# Patient Record
Sex: Female | Born: 1935 | Race: White | Hispanic: No | Smoking: Never smoker
Health system: Southern US, Community
[De-identification: ages and names within clinical notes are randomized; demographics above are authoritative.]

## PROBLEM LIST (undated history)

## (undated) DIAGNOSIS — R1310 Dysphagia, unspecified: Secondary | ICD-10-CM

## (undated) DIAGNOSIS — E119 Type 2 diabetes mellitus without complications: Secondary | ICD-10-CM

## (undated) DIAGNOSIS — I10 Essential (primary) hypertension: Secondary | ICD-10-CM

## (undated) DIAGNOSIS — E039 Hypothyroidism, unspecified: Secondary | ICD-10-CM

## (undated) DIAGNOSIS — J40 Bronchitis, not specified as acute or chronic: Secondary | ICD-10-CM

## (undated) DIAGNOSIS — I2510 Atherosclerotic heart disease of native coronary artery without angina pectoris: Secondary | ICD-10-CM

## (undated) DIAGNOSIS — H409 Unspecified glaucoma: Secondary | ICD-10-CM

## (undated) DIAGNOSIS — E079 Disorder of thyroid, unspecified: Secondary | ICD-10-CM

## (undated) DIAGNOSIS — R809 Proteinuria, unspecified: Secondary | ICD-10-CM

## (undated) DIAGNOSIS — D126 Benign neoplasm of colon, unspecified: Secondary | ICD-10-CM

## (undated) DIAGNOSIS — N189 Chronic kidney disease, unspecified: Secondary | ICD-10-CM

## (undated) DIAGNOSIS — M1990 Unspecified osteoarthritis, unspecified site: Secondary | ICD-10-CM

## (undated) DIAGNOSIS — F0390 Unspecified dementia without behavioral disturbance: Secondary | ICD-10-CM

## (undated) HISTORY — DX: Benign neoplasm of colon, unspecified: D12.6

## (undated) HISTORY — PX: CATARACT EXTRACTION: SUR2

## (undated) HISTORY — PX: ABDOMINAL HYSTERECTOMY: SHX81

## (undated) HISTORY — DX: Type 2 diabetes mellitus without complications: E11.9

## (undated) HISTORY — PX: CORONARY ANGIOPLASTY WITH STENT PLACEMENT: SHX49

## (undated) HISTORY — DX: Unspecified osteoarthritis, unspecified site: M19.90

## (undated) HISTORY — DX: Disorder of thyroid, unspecified: E07.9

## (undated) HISTORY — DX: Proteinuria, unspecified: R80.9

---
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
## 1988-11-09 HISTORY — PX: ROTATOR CUFF REPAIR: SHX139

## 1992-11-09 HISTORY — PX: CERVICAL LAMINECTOMY: SHX94

## 2004-09-30 ENCOUNTER — Ambulatory Visit: Payer: Self-pay | Admitting: Unknown Physician Specialty

## 2005-03-19 ENCOUNTER — Ambulatory Visit: Payer: Self-pay | Admitting: Family Medicine

## 2005-06-27 ENCOUNTER — Emergency Department: Payer: Self-pay | Admitting: Emergency Medicine

## 2006-04-27 ENCOUNTER — Ambulatory Visit: Payer: Self-pay | Admitting: Family Medicine

## 2007-05-03 ENCOUNTER — Ambulatory Visit: Payer: Self-pay | Admitting: Family Medicine

## 2007-08-29 ENCOUNTER — Ambulatory Visit: Payer: Self-pay | Admitting: Unknown Physician Specialty

## 2007-10-24 ENCOUNTER — Ambulatory Visit: Payer: Self-pay | Admitting: Anesthesiology

## 2007-10-28 ENCOUNTER — Ambulatory Visit: Payer: Self-pay | Admitting: Anesthesiology

## 2007-11-10 HISTORY — PX: COLONOSCOPY W/ POLYPECTOMY: SHX1380

## 2007-11-29 ENCOUNTER — Ambulatory Visit: Payer: Self-pay | Admitting: Anesthesiology

## 2007-12-07 ENCOUNTER — Ambulatory Visit: Payer: Self-pay | Admitting: Unknown Physician Specialty

## 2007-12-27 ENCOUNTER — Ambulatory Visit: Payer: Self-pay | Admitting: Anesthesiology

## 2008-02-09 ENCOUNTER — Ambulatory Visit: Payer: Self-pay | Admitting: Anesthesiology

## 2008-05-03 ENCOUNTER — Ambulatory Visit: Payer: Self-pay | Admitting: Family Medicine

## 2008-10-16 ENCOUNTER — Inpatient Hospital Stay (HOSPITAL_COMMUNITY): Admission: EM | Admit: 2008-10-16 | Discharge: 2008-10-19 | Payer: Self-pay | Admitting: Internal Medicine

## 2008-10-19 ENCOUNTER — Encounter (INDEPENDENT_AMBULATORY_CARE_PROVIDER_SITE_OTHER): Payer: Self-pay | Admitting: Cardiology

## 2008-10-19 ENCOUNTER — Ambulatory Visit: Payer: Self-pay | Admitting: Vascular Surgery

## 2008-11-22 ENCOUNTER — Encounter: Payer: Self-pay | Admitting: Cardiovascular Disease

## 2008-12-04 ENCOUNTER — Inpatient Hospital Stay (HOSPITAL_COMMUNITY): Admission: EM | Admit: 2008-12-04 | Discharge: 2008-12-05 | Payer: Self-pay | Admitting: Emergency Medicine

## 2008-12-10 ENCOUNTER — Encounter: Payer: Self-pay | Admitting: Cardiovascular Disease

## 2009-01-07 ENCOUNTER — Encounter: Payer: Self-pay | Admitting: Cardiovascular Disease

## 2009-05-06 ENCOUNTER — Ambulatory Visit: Payer: Self-pay | Admitting: Family Medicine

## 2009-10-01 ENCOUNTER — Ambulatory Visit: Payer: Self-pay | Admitting: Vascular Surgery

## 2010-05-07 ENCOUNTER — Ambulatory Visit: Payer: Self-pay | Admitting: Internal Medicine

## 2010-11-12 ENCOUNTER — Ambulatory Visit: Payer: Self-pay | Admitting: Internal Medicine

## 2010-11-17 ENCOUNTER — Ambulatory Visit: Payer: Self-pay | Admitting: Internal Medicine

## 2011-02-23 LAB — CARDIAC PANEL(CRET KIN+CKTOT+MB+TROPI)
CK, MB: 2.6 ng/mL (ref 0.3–4.0)
CK, MB: 3.2 ng/mL (ref 0.3–4.0)
Total CK: 49 U/L (ref 7–177)
Troponin I: <0.01 ng/mL (ref 0.00–0.06)

## 2011-02-23 LAB — LIPID PANEL
Cholesterol: 122 mg/dL (ref 0–200)
LDL Cholesterol: 67 mg/dL (ref 0–99)
Triglycerides: 100 mg/dL (ref <150)
VLDL: 20 mg/dL (ref 0–40)

## 2011-02-23 LAB — CBC
HCT: 37.2 % (ref 36.0–46.0)
HCT: 40.1 % (ref 36.0–46.0)
Hemoglobin: 13.3 g/dL (ref 12.0–15.0)
MCHC: 33.9 g/dL (ref 30.0–36.0)
MCV: 96.6 fL (ref 78.0–100.0)
Platelets: 152 10*3/uL (ref 150–400)
Platelets: 161 10*3/uL (ref 150–400)
RBC: 3.75 MIL/uL — ABNORMAL LOW (ref 3.87–5.11)
RBC: 3.89 MIL/uL (ref 3.87–5.11)
RDW: 12.8 % (ref 11.5–15.5)
WBC: 4.6 10*3/uL (ref 4.0–10.5)
WBC: 5.7 10*3/uL (ref 4.0–10.5)
WBC: 6 10*3/uL (ref 4.0–10.5)

## 2011-02-23 LAB — CK TOTAL AND CKMB (NOT AT ARMC)
Relative Index: INVALID (ref 0.0–2.5)
Total CK: 65 U/L (ref 7–177)

## 2011-02-23 LAB — HEPARIN LEVEL (UNFRACTIONATED)
Heparin Unfractionated: 0.4 IU/mL (ref 0.30–0.70)
Heparin Unfractionated: 0.64 IU/mL (ref 0.30–0.70)

## 2011-02-23 LAB — HEMOGLOBIN A1C
Hgb A1c MFr Bld: 6.6 % — ABNORMAL HIGH (ref 4.6–6.1)
Mean Plasma Glucose: 143 mg/dL

## 2011-02-23 LAB — GLUCOSE, CAPILLARY
Glucose-Capillary: 150 mg/dL — ABNORMAL HIGH (ref 70–99)
Glucose-Capillary: 153 mg/dL — ABNORMAL HIGH (ref 70–99)

## 2011-02-23 LAB — DIFFERENTIAL
Lymphocytes Relative: 22 % (ref 12–46)
Lymphs Abs: 1.3 10*3/uL (ref 0.7–4.0)
Monocytes Relative: 7 % (ref 3–12)
Neutro Abs: 4 10*3/uL (ref 1.7–7.7)
Neutrophils Relative %: 66 % (ref 43–77)

## 2011-02-23 LAB — APTT: aPTT: 22 seconds — ABNORMAL LOW (ref 24–37)

## 2011-02-23 LAB — PROTIME-INR
INR: 0.9 (ref 0.00–1.49)
Prothrombin Time: 12.7 seconds (ref 11.6–15.2)

## 2011-02-23 LAB — COMPREHENSIVE METABOLIC PANEL
ALT: 14 U/L (ref 0–35)
CO2: 29 mEq/L (ref 19–32)
Calcium: 9.9 mg/dL (ref 8.4–10.5)
Creatinine, Ser: 0.84 mg/dL (ref 0.4–1.2)
GFR calc non Af Amer: >60 mL/min (ref >60)
Glucose, Bld: 142 mg/dL — ABNORMAL HIGH (ref 70–99)
Sodium: 139 mEq/L (ref 135–145)
Total Protein: 6.4 g/dL (ref 6.0–8.3)

## 2011-02-23 LAB — TROPONIN I: Troponin I: <0.01 ng/mL (ref 0.00–0.06)

## 2011-03-24 NOTE — Discharge Summary (Signed)
[name], Dorsey NO.:  [id number]   MEDICAL RECORD NO.:  [id number]           PATIENT TYPE:   LOCATION:                                 FACILITY:   PHYSICIAN:  Dr. Tresa Endo              DATE OF BIRTH:  Mar 08, 1930   DATE OF ADMISSION:  DATE OF DISCHARGE:                               DISCHARGE SUMMARY   DISCHARGE DIAGNOSES:  1. Non-ST-elevation by troponin with a peak troponin of 1.99 with      negative CK-MB on this admission, treated with left anterior      descending drug-eluting stent placement x2 by Dr. Jacinto Halim on October 16, 2008.  2. Residual 70% small right coronary artery disease and normal      circumflex, to be treated medically.  3. Preserved left ventricular function.  4. Re-look catheterization for recurrent pain on October 18, 2008,      showing a patent left anterior descending.  5. Non-insulin-dependent diabetes.  6. Dyslipidemia with a history of Zocor intolerance secondary to      myalgia, discharged on low-dose Crestor on this admission.  7. Treated hypertension.  8. Treated hypothyroidism.  9. History of irritable bowel syndrome in the past.   HOSPITAL COURSE:  The patient is a 77 year old female from [place],  who has been seen by Dr. Tresa Endo and is also followed by Dr. Angelique Holm.  She has no prior cardiac history but does have multiple risk factors.  She had a Myoview in January 2009 that showed her EF to be 59%.  Echocardiogram in December 2008 showed mild aortic valve sclerosis.  The  patient was admitted on October 16, 2008, with chest pain consistent  with unstable angina.  She was admitted by Dr. Lynnea Ferrier.  She was started  on heparin and nitrates and Integrilin.  Her initial troponins were  negative but then turned positive, and she was taken to the cath lab  later on October 16, 2008.  This was done by Dr. Jacinto Halim.  Catheterization  revealed a 70% small RCA, normal left main, normal circumflex, normal  ramus intermedius,  99% LAD after the takeoff of the first diagonal.  She  had 2 stents placed to her LAD, a Xience and a Multi-Link Vision stent.  She tolerated the procedure well.  Troponins peaked at 1.99 and her CK-  MBs remained negative.  She did have some recurrent chest pain and was  taken back to the cath lab on October 18, 2008, by Dr. Elsie Lincoln to look  at the LAD.  He notes in his report that the entire LAD was widely  patent.  The patient was reassured.  She is transferred back to  telemetry and ambulated.  She is pain free on October 19, 2008.  She  can be discharged later today.  She does have some ecchymosis on her  right groin, this was from the October 16, 2008, procedure, the left  groin is without hematoma or ecchymosis.   LABORATORY DATA:  White count 6.2, hemoglobin 11.1,  hematocrit 32.3,  platelets 114.  Sodium 141, potassium 3.4, BUN 23, creatinine 0.9.  CK-  MB were negative.  Troponin peaked at 1.99 on October 17, 2008.   Radiology shows no acute disease.  EKG shows a sinus rhythm without  acute changes.  She does have some T-wave inversion on October 18, 2008, in the anterolateral leads.   DISCHARGE MEDICATIONS:  1. Glyburide 6 mg in the morning, 6 mg at night.  2. Synthroid 0.05 mg a day.  3. Metoprolol 100 mg twice a day.  4. Potassium 20 mEq b.i.d.  5. Norvasc 10 mg a day.  6. Benicar/hydrochlorothiazide 40/25 daily.  7. Travatan ophthalmic drops nightly.  8. Beconase nasal spray p.r.n.  9. Aspirin 81 mg 2 tablets a day.  10.Byetta 5 mcg a day.  11.Crestor 5 mg a day.  12.Nitroglycerin sublingual p.r.n.  13.She will resume her metformin 1 g b.i.d. on October 21, 2008.   DISPOSITION:  The patient is discharged in stable condition.  She will  follow up with Dr. Tresa Endo in a couple of weeks.      Abelino Derrick, P.A.    ______________________________  Dr. Berline Lopes  D:  10/19/2008  T:  10/19/2008  Job:  [id number]

## 2011-03-24 NOTE — Cardiovascular Report (Signed)
NAMEERNEST, Jody Dorsey NO.:  [id number]   MEDICAL RECORD NO.:  [id number]          PATIENT TYPE:  INP   LOCATION:  2024                         FACILITY:  [facility]   PHYSICIAN:  Madaline Savage, M.D.DATE OF BIRTH:  [birth year]   DATE OF PROCEDURE:  10/18/2008  DATE OF DISCHARGE:                            CARDIAC CATHETERIZATION   PROCEDURES PERFORMED:  1. Selective coronary angiography by Judkins technique.  2. No left ventricular angiogram.  No right coronary arteriography      performed.  3. No additional percutaneous intervention performed.   PATIENT PROFILE:  [name] is a 77 year old woman who underwent  stenting of her mid left anterior descending coronary artery on October 16, 2008, by Dr. Yates Decamp.  XIENCE 4.0 x 18 mm stent was used as well as  a Multi-Link Vision 3.0 x 12 mm stent, both were deployed maximally.  The patient had both spasm and a no-flow phenomenon both in-stent and at  the apex of the LAD.  After medical therapy over the last 2 days, the  patient was now brought back today in an asymptomatic state of health to  reassess the anatomy of the LAD in specific.  No percutaneous coronary  intervention was done.  The patient did not have intravascular  ultrasound.  The patient had only contrast dye injections of her LAD.   RESULTS:  Left main coronary artery was normal.   LAD coursed the cardiac apex, is tortuous, and has 2 radiopaque stents  in the midportion of the vessel spanning a length of approximately 30  mm.  The stent is widely deployed.  There is TIMI 3 flow both inside the  stent and proximal and distal to the stent.  This has a pristine  appearance.   The circumflex is also visualized and this vessel is large and the  dominant vessel of the circulation with no significant lesions.   The right coronary artery was not visualized today.   FINAL IMPRESSION:  Wide patency of the mid left anterior descending over  a 30-mm length  of stent that is 4.0 at the proximal end of the stent and  3 mm or so at the distal end of the stent.   PLAN:  The patient will be reassured.  Her right groin was avoided  during today's case because it was ecchymotic and swollen.  The left  groin was the entry site.  The patient will be a candidate for  ambulation later tonight or tomorrow and hopefully then will be a  candidate for discharge.           ______________________________  Madaline Savage, M.D.     WHG/MEDQ  D:  10/18/2008  T:  10/18/2008  Job:  [id number]   cc:   Nicki Guadalajara, M.D.  [facility] Cath Lab

## 2011-03-24 NOTE — Discharge Summary (Signed)
Jody Dorsey, Jody Dorsey NO.:  [id number]   MEDICAL RECORD NO.:  [id number]          PATIENT TYPE:  INP   LOCATION:  3735                         FACILITY:  [facility]   PHYSICIAN:  Antonieta Iba, MD   DATE OF BIRTH:  Mar 11, 1930   DATE OF ADMISSION:  12/04/2008  DATE OF DISCHARGE:  12/05/2008                               DISCHARGE SUMMARY   DISCHARGE DIAGNOSES:  1. Unstable angina.  2. Known coronary disease with left anterior descending stenting on      October 16, 2008, with re-look, October 18, 2008, showing patency      of the site and patency of the distal left anterior descending      which did demonstrate some spasm on October 16, 2008.  3. Residual 70% small right coronary artery to be treated medically.  4. Normal left ventricular function.  5. Non-insulin-dependent diabetes.  6. Treated hypertension.  7. Treated dyslipidemia.   HOSPITAL COURSE:  The patient is a 77 year old female followed by Dr. Lorin Picket  and seen by Dr. Mariah Milling in [place] on December 04, 2008.  She  apparently developed some substernal chest pain and presented to Dr.  Windell Hummingbird office.  Her EKG shows sinus rhythm with nonspecific ST  changes.  She was admitted to [facility] and started on heparin.  CK-MB  troponins were obtained.  These were negative for an MI.  We did add a  PPI.  We will also added nitrate.  She has had no recurrent pain,  December 05, 2008 and Dr. Lynnea Ferrier feels she can be discharged.  She will  be set up for an outpatient Myoview and a followup with Dr. Mariah Milling.   DISCHARGE MEDICATIONS:  1. Metformin 1 g b.i.d.  2. Glyburide 6 mg twice a day.  3. Synthroid 0.05 mg a day.  4. Metoprolol 100 mg twice a day.  5. Potassium 10 mEq a day.  6. Norvasc 10 mg a day.  7. Benicar/HCTZ 40/25 daily.  8. Beconase nasal spray.  9. Aspirin 81 mg a day.  10.Byetta 5 mg twice a day.  11.Plavix 75 mg a day.  12.Crestor 5 mg a day.  13.Nitroglycerin sublingual p.r.n.  14.Clonidine  0.1 mg b.i.d.  15.Travatan ophthalmic drops.  16.Prilosec 20 mg a day.  17.Imdur 30 mg a day.   LABORATORY DATA:  CK-MB and troponins were negative x3.  White count  4.6, hemoglobin 12.7, hematocrit 37.2, and platelets 152.  EKG shows  normal sinus rhythm with nonspecific ST changes.  Sodium 139, potassium  4.1, BUN 18, and creatinine 0.8.  The LFTs were normal.  INR is 0.9.  BNP is 70.  Hemoglobin A1c is 6.6.  Lipid panel shows a cholesterol of  122, HDL 35, and LDL 67.   DISPOSITION:  The patient is discharged in stable condition and will  follow up with Dr. Mariah Milling after she gets an outpatient Myoview.      Abelino Derrick, P.A.      Antonieta Iba, MD  Electronically Signed    LKK/MEDQ  D:  12/05/2008  T:  12/06/2008  Job:  [id number]

## 2011-03-24 NOTE — Cardiovascular Report (Signed)
[name], [name] NO.:  [id number]   MEDICAL RECORD NO.:  [id number]          PATIENT TYPE:  INP   LOCATION:  2902                         FACILITY:  [facility]   PHYSICIAN:  Jody Hilts. Jacinto Halim, MD       DATE OF BIRTH:  1930/04/23   DATE OF PROCEDURE:  10/16/2008  DATE OF DISCHARGE:                            CARDIAC CATHETERIZATION   PROCEDURES PERFORMED:  1. Left ventriculography.  2. Selective left coronary arteriography.  3. Percutaneous transluminal coronary angioplasty and stenting of the      mid-left anterior descending.   INDICATION:  Ms. Jody Dorsey is a 77 year old female, who was admitted  to the hospital with non-Q-wave myocardial infarction with ongoing chest  discomfort.  She was found to have positive cardiac markers for non-Q-  wave myocardial infarction.  She had nonspecific EKG changes.  Given her  significant chest pain and EKG changes and positive cardiac markers, she  was brought to the Cardiac Catheterization Lab to evaluate her coronary  anatomy.   HEMODYNAMIC DATA:  The ventricular pressure was 113/-8 with end-  diastolic pressure of 2 mmHg.  Aortic pressure was 107/54 with a mean of  77 mmHg.  There is no pressure gradient across aortic valve.   ANGIOGRAPHIC DATA:  Left ventricle:  Left ventricular systolic function  was normal with ejection fraction of 65%-70%.  There was mid cavitary  obliteration at the apex.   Right coronary artery:  Right coronary artery is nondominant vessel with  a 70% mid stenosis.   Left main coronary artery:  Left main coronary artery is a large vessel,  smooth, and normal.   Circumflex coronary artery:  Circumflex is a large vessel.  It is a  dominant vessel.  Smooth and normal.   Ramus intermediate:  Ramus intermediate is a large vessel.  It is  tortuous, but is otherwise smooth and normal.   LAD:  LAD is a large vessel.  Gives origin to several small diagonals.  Mid segment of the LAD has a high-grade  99% stenoses.  The LAD wraps  around the apex, and at the apex, there is a focal 80% stenosis.   INTERVENTIONAL DATA:  Successful PTCA and stenting of the mid-LAD.  A  4.0 x 18-mm XIENCE V stent was deployed at 10 atmospheric pressure for  10 seconds.  Following this, there was the abrupt closure that was  evident after predilatation with the balloon did not open up.  Hence,  the distal dissection was suspected.  A 3.0 x 12-mm Vision stent was  then deployed distally with an intent that she may end up needing to go  for bypass surgery emergently.  Having performed this, there was still  TIMI-0 flow.   With intracoronary nitroglycerin administration, adenosine  administration, and verapamil administration, the vessel opened up with  establishment of TIMI-3 flow.  Multiple balloon inflations were  performed within the stent including utilization of a 2.5 x 12-mm apex  balloon at 6 atmospheric pressure to potentially relieve the spasm  distally and eventually the second stent was postdilated with a 3.25 x  15-mm Voyager [place] balloon at 14 atmospheric pressure for 30 seconds.  Having performed this, angiography revealed excellent results.  Overall,  the patient was completely asymptomatic with ST segment back to  baseline.   A total of 220 mL of contrast was utilized for diagnostic and  interventional procedure.   RECOMMENDATIONS:  The patient will be admitted to the intensive care  unit for observation.  Cardiac markers will be followed through along  with the EKG.  Hopefully, the rest of the course will be smooth.  She  will need establish a statin therapy and risk modification.   TECHNIQUE OF THE PROCEDURE:  Under usual sterile precautions, using the  6-French right femoral artery access, 6-French multipurpose B2 catheter  was advanced into the ascending aorta and then to the left ventricle.  Left ventriculography was performed both in LAO and RAO projection.  Catheter pulled into the  ascending aorta.  The right coronary artery was  selectively engaged and angiography was performed.  Then, left main  coronary artery selectively engaged and angiography was performed.  Then, catheter pulled out of body.   A 7-French FL-4 guide was utilized to engage left main coronary artery.  Then using ATW guidewire, the K-wire was carefully advanced into the LAD  and the lesion length was measured.  A 3.0 x 12-mm Voyager balloon was  utilized and balloon inflations at 9 atmospheric pressure for 30 seconds  was performed.  Because of residual stenosis at high pressure, 18-20  atmospheric pressure for 12 seconds was performed and angiography was  repeated.  There was complete abrupt closure of the vessel.  I suspected  that there was probably a dissection.  A 4.0 x 18-mm XIENCE stent was  deployed at 10 atmospheric pressure and intracoronary nitroglycerin was  administered and angiography was performed.  There was no establishment  of flow.  This stent was overlapped with a 3.0 x 12-mm Multi-Link Vision  stent and the stent was deployed at 12 atmospheric pressure for 14  seconds and angiography repeated.  Again, no flow was evident.  A 2.5 x  12-mm apex balloon was utilized and balloon inflations were performed  throughout the stented segment and also distally at low pressures of 6  atmospheric pressures.  In spite of this, there was no evidence of flow.  Multiple episodes of intracoronary nitroglycerin, verapamil, and  adenosine was administered and angiography revealed excellent results  with TIMI-3 flow.  There was no evidence of any edge dissection.  The  distal stent was then postdilated with a 3.25 x 15-mm Voyager [place] balloon  at 14 atmospheric pressure for 30 seconds and angiography was repeated.  Overall, excellent results were performed.  Excellent results were  noted.  Again, a non-DES stent was utilized distally because of the fear  that the patient may end up needing to go  for bypass surgery.  Hence,  DES stent was not utilized.  However, because of Dr. Ofilia Dorsey did  step into the room, and by that time, the patient was completely  asymptomatic with excellent results.  His presence was very well  appreciated.  The guidewire was withdrawn.  Angiography completed.  Guide catheter was disengaged and pulled out of body.  The patient  tolerated the procedure.  Otherwise, no other significant complications  were noted at the end of the procedure.      Jody Hilts. Jacinto Halim, MD  Electronically Signed     JRG/MEDQ  D:  10/16/2008  T:  10/17/2008  Job:  [id number]   cc:   Angelique Holm, MD  Nicki Guadalajara, M.D.

## 2011-03-24 NOTE — Discharge Summary (Signed)
NAMEGALAXY, BORDEN NO.:  [id number]   MEDICAL RECORD NO.:  [id number]          PATIENT TYPE:  INP   LOCATION:  2024                         FACILITY:  [facility]   PHYSICIAN:  Sheliah Mends, MD      DATE OF BIRTH:  [birth year]   DATE OF ADMISSION:  10/15/2008  DATE OF DISCHARGE:  10/19/2008                               DISCHARGE SUMMARY   ADDENDUM:  Dr. Lynnea Ferrier wanted a duplex of her right femoral artery prior  to discharge, preliminary report shows no pseudoaneurysm.  She will be  discharged today afternoon with followup with Dr. Lynnea Ferrier per the  patient's request.      Abelino Derrick, P.A.      Sheliah Mends, MD  Electronically Signed    LKK/MEDQ  D:  10/19/2008  T:  10/20/2008  Job:  [id number]

## 2011-03-30 ENCOUNTER — Ambulatory Visit: Payer: Self-pay | Admitting: Unknown Physician Specialty

## 2011-04-01 LAB — PATHOLOGY REPORT

## 2011-05-11 ENCOUNTER — Ambulatory Visit: Payer: Self-pay | Admitting: Internal Medicine

## 2011-08-14 LAB — POCT CARDIAC MARKERS
CKMB, poc: 3.3 ng/mL (ref 1.0–8.0)
Myoglobin, poc: 60.4 ng/mL (ref 12–200)
Myoglobin, poc: 94.3 ng/mL (ref 12–200)
Troponin i, poc: <0.05 ng/mL (ref 0.00–0.09)

## 2011-08-14 LAB — CARDIAC PANEL(CRET KIN+CKTOT+MB+TROPI)
CK, MB: 6.6 ng/mL — ABNORMAL HIGH (ref 0.3–4.0)
CK, MB: 7.1 ng/mL — ABNORMAL HIGH (ref 0.3–4.0)
Relative Index: INVALID (ref 0.0–2.5)
Relative Index: INVALID (ref 0.0–2.5)
Relative Index: INVALID (ref 0.0–2.5)
Relative Index: INVALID (ref 0.0–2.5)
Relative Index: INVALID (ref 0.0–2.5)
Total CK: 65 U/L (ref 7–177)
Total CK: 73 U/L (ref 7–177)
Total CK: 76 U/L (ref 7–177)
Total CK: 85 U/L (ref 7–177)
Total CK: 87 U/L (ref 7–177)
Total CK: 98 U/L (ref 7–177)
Troponin I: 0.86 ng/mL (ref 0.00–0.06)
Troponin I: 1.13 ng/mL (ref 0.00–0.06)
Troponin I: 1.41 ng/mL (ref 0.00–0.06)
Troponin I: 1.99 ng/mL (ref 0.00–0.06)

## 2011-08-14 LAB — BASIC METABOLIC PANEL
BUN: 18 mg/dL (ref 6–23)
Calcium: 9.5 mg/dL (ref 8.4–10.5)
Calcium: 9.8 mg/dL (ref 8.4–10.5)
Creatinine, Ser: 0.58 mg/dL (ref 0.4–1.2)
Creatinine, Ser: 0.79 mg/dL (ref 0.4–1.2)
GFR calc Af Amer: >60 mL/min (ref >60)
GFR calc Af Amer: >60 mL/min (ref >60)
GFR calc Af Amer: >60 mL/min (ref >60)
GFR calc non Af Amer: >60 mL/min (ref >60)
GFR calc non Af Amer: >60 mL/min (ref >60)
GFR calc non Af Amer: >60 mL/min (ref >60)
Glucose, Bld: 158 mg/dL — ABNORMAL HIGH (ref 70–99)
Glucose, Bld: 205 mg/dL — ABNORMAL HIGH (ref 70–99)
Sodium: 137 mEq/L (ref 135–145)
Sodium: 141 mEq/L (ref 135–145)

## 2011-08-14 LAB — HEPATIC FUNCTION PANEL
ALT: 15 U/L (ref 0–35)
AST: 24 U/L (ref 0–37)
Alkaline Phosphatase: 44 U/L (ref 39–117)
Bilirubin, Direct: 0.2 mg/dL (ref 0.0–0.3)
Indirect Bilirubin: 0.5 mg/dL (ref 0.3–0.9)
Total Bilirubin: 0.7 mg/dL (ref 0.3–1.2)

## 2011-08-14 LAB — TSH: TSH: 1.971 u[IU]/mL (ref 0.350–4.500)

## 2011-08-14 LAB — LIPID PANEL
HDL: 35 mg/dL — ABNORMAL LOW (ref >39)
LDL Cholesterol: 110 mg/dL — ABNORMAL HIGH (ref 0–99)
Triglycerides: 159 mg/dL — ABNORMAL HIGH (ref <150)

## 2011-08-14 LAB — DIFFERENTIAL
Basophils Absolute: 0.1 10*3/uL (ref 0.0–0.1)
Eosinophils Relative: 5 % (ref 0–5)
Lymphocytes Relative: 25 % (ref 12–46)
Lymphs Abs: 1.5 10*3/uL (ref 0.7–4.0)
Monocytes Absolute: 0.4 10*3/uL (ref 0.1–1.0)
Monocytes Relative: 7 % (ref 3–12)
Neutro Abs: 3.9 10*3/uL (ref 1.7–7.7)

## 2011-08-14 LAB — GLUCOSE, CAPILLARY
Glucose-Capillary: 122 mg/dL — ABNORMAL HIGH (ref 70–99)
Glucose-Capillary: 146 mg/dL — ABNORMAL HIGH (ref 70–99)
Glucose-Capillary: 152 mg/dL — ABNORMAL HIGH (ref 70–99)
Glucose-Capillary: 167 mg/dL — ABNORMAL HIGH (ref 70–99)
Glucose-Capillary: 176 mg/dL — ABNORMAL HIGH (ref 70–99)
Glucose-Capillary: 248 mg/dL — ABNORMAL HIGH (ref 70–99)

## 2011-08-14 LAB — CBC
HCT: 32.3 % — ABNORMAL LOW (ref 36.0–46.0)
HCT: 34 % — ABNORMAL LOW (ref 36.0–46.0)
HCT: 37.5 % (ref 36.0–46.0)
Hemoglobin: 11.1 g/dL — ABNORMAL LOW (ref 12.0–15.0)
Hemoglobin: 11.1 g/dL — ABNORMAL LOW (ref 12.0–15.0)
Hemoglobin: 12.8 g/dL (ref 12.0–15.0)
MCHC: 33.3 g/dL (ref 30.0–36.0)
MCHC: 34.2 g/dL (ref 30.0–36.0)
MCHC: 34.3 g/dL (ref 30.0–36.0)
MCV: 95.8 fL (ref 78.0–100.0)
MCV: 97.6 fL (ref 78.0–100.0)
Platelets: 148 10*3/uL — ABNORMAL LOW (ref 150–400)
Platelets: 156 10*3/uL (ref 150–400)
RBC: 3.93 MIL/uL (ref 3.87–5.11)
RDW: 12.8 % (ref 11.5–15.5)
RDW: 12.9 % (ref 11.5–15.5)
RDW: 13.1 % (ref 11.5–15.5)
WBC: 10.9 10*3/uL — ABNORMAL HIGH (ref 4.0–10.5)
WBC: 6.2 10*3/uL (ref 4.0–10.5)

## 2011-08-14 LAB — URINE CULTURE

## 2011-08-14 LAB — URINALYSIS, MICROSCOPIC ONLY
Bilirubin Urine: NEGATIVE
Glucose, UA: 100 mg/dL — AB
Hgb urine dipstick: NEGATIVE
Ketones, ur: NEGATIVE mg/dL
Nitrite: NEGATIVE
Specific Gravity, Urine: 1.011 (ref 1.005–1.030)
pH: 5.5 (ref 5.0–8.0)

## 2011-08-14 LAB — POCT I-STAT, CHEM 8
Hemoglobin: 15 g/dL (ref 12.0–15.0)
Potassium: 3.2 mEq/L — ABNORMAL LOW (ref 3.5–5.1)
Sodium: 136 mEq/L (ref 135–145)
TCO2: 30 mmol/L (ref 0–100)

## 2011-08-14 LAB — CK TOTAL AND CKMB (NOT AT ARMC): Relative Index: INVALID (ref 0.0–2.5)

## 2011-08-14 LAB — APTT: aPTT: 21 seconds — ABNORMAL LOW (ref 24–37)

## 2011-08-14 LAB — AMYLASE: Amylase: 40 U/L (ref 27–131)

## 2011-08-27 ENCOUNTER — Emergency Department (HOSPITAL_COMMUNITY): Payer: Medicare Other

## 2011-08-27 ENCOUNTER — Emergency Department (HOSPITAL_COMMUNITY)
Admission: EM | Admit: 2011-08-27 | Discharge: 2011-08-27 | Disposition: A | Discharge status: Home / Self Care | Payer: Medicare Other | Attending: Emergency Medicine | Admitting: Emergency Medicine

## 2011-08-27 DIAGNOSIS — R109 Unspecified abdominal pain: Secondary | ICD-10-CM | POA: Insufficient documentation

## 2011-08-27 DIAGNOSIS — Z79899 Other long term (current) drug therapy: Secondary | ICD-10-CM | POA: Insufficient documentation

## 2011-08-27 DIAGNOSIS — E119 Type 2 diabetes mellitus without complications: Secondary | ICD-10-CM | POA: Insufficient documentation

## 2011-08-27 DIAGNOSIS — X58XXXA Exposure to other specified factors, initial encounter: Secondary | ICD-10-CM | POA: Insufficient documentation

## 2011-08-27 DIAGNOSIS — S239XXA Sprain of unspecified parts of thorax, initial encounter: Secondary | ICD-10-CM | POA: Insufficient documentation

## 2011-08-27 DIAGNOSIS — I10 Essential (primary) hypertension: Secondary | ICD-10-CM | POA: Insufficient documentation

## 2011-08-27 DIAGNOSIS — M546 Pain in thoracic spine: Secondary | ICD-10-CM | POA: Insufficient documentation

## 2011-08-27 DIAGNOSIS — Z7982 Long term (current) use of aspirin: Secondary | ICD-10-CM | POA: Insufficient documentation

## 2011-08-27 DIAGNOSIS — E039 Hypothyroidism, unspecified: Secondary | ICD-10-CM | POA: Insufficient documentation

## 2011-08-27 LAB — URINALYSIS, ROUTINE W REFLEX MICROSCOPIC
Leukocytes, UA: NEGATIVE
Protein, ur: NEGATIVE mg/dL
Urobilinogen, UA: 1 mg/dL (ref 0.0–1.0)

## 2011-08-27 LAB — URINE MICROSCOPIC-ADD ON

## 2012-01-14 ENCOUNTER — Emergency Department: Payer: Self-pay | Admitting: Internal Medicine

## 2012-01-14 LAB — CBC
HCT: 37.5 % (ref 35.0–47.0)
MCH: 32.7 pg (ref 26.0–34.0)
MCHC: 33.5 g/dL (ref 32.0–36.0)
RDW: 11.9 % (ref 11.5–14.5)
WBC: 7.4 10*3/uL (ref 3.6–11.0)

## 2012-01-14 LAB — COMPREHENSIVE METABOLIC PANEL
Albumin: 3.8 g/dL (ref 3.4–5.0)
Alkaline Phosphatase: 52 U/L (ref 50–136)
BUN: 23 mg/dL — ABNORMAL HIGH (ref 7–18)
Calcium, Total: 9.5 mg/dL (ref 8.5–10.1)
Glucose: 181 mg/dL — ABNORMAL HIGH (ref 65–99)
Osmolality: 282 (ref 275–301)
SGOT(AST): 17 U/L (ref 15–37)
Sodium: 137 mmol/L (ref 136–145)

## 2012-01-14 LAB — PROTIME-INR: Prothrombin Time: 12.7 secs (ref 11.5–14.7)

## 2012-02-18 ENCOUNTER — Other Ambulatory Visit: Payer: Self-pay | Admitting: Neurosurgery

## 2012-02-18 ENCOUNTER — Ambulatory Visit
Admission: RE | Admit: 2012-02-18 | Discharge: 2012-02-18 | Disposition: A | Discharge status: Home / Self Care | Payer: Medicare Other | Source: Ambulatory Visit | Attending: Neurosurgery | Admitting: Neurosurgery

## 2012-02-18 DIAGNOSIS — M542 Cervicalgia: Secondary | ICD-10-CM

## 2012-02-18 DIAGNOSIS — M47812 Spondylosis without myelopathy or radiculopathy, cervical region: Secondary | ICD-10-CM

## 2012-02-18 DIAGNOSIS — M5030 Other cervical disc degeneration, unspecified cervical region: Secondary | ICD-10-CM

## 2012-05-19 ENCOUNTER — Ambulatory Visit: Payer: Self-pay | Admitting: Internal Medicine

## 2012-10-13 ENCOUNTER — Ambulatory Visit: Payer: Self-pay | Admitting: Ophthalmology

## 2012-10-13 LAB — POTASSIUM: Potassium: 3.9 mmol/L (ref 3.5–5.1)

## 2012-10-25 ENCOUNTER — Ambulatory Visit: Payer: Self-pay | Admitting: Ophthalmology

## 2012-11-30 ENCOUNTER — Encounter (HOSPITAL_COMMUNITY): Payer: Self-pay | Admitting: Internal Medicine

## 2012-11-30 ENCOUNTER — Emergency Department: Payer: Self-pay | Admitting: Emergency Medicine

## 2012-11-30 ENCOUNTER — Inpatient Hospital Stay (HOSPITAL_COMMUNITY): Payer: Medicare Other

## 2012-11-30 ENCOUNTER — Inpatient Hospital Stay (HOSPITAL_COMMUNITY)
Admission: EM | Admit: 2012-11-30 | Discharge: 2012-12-04 | DRG: 086 | Disposition: A | Discharge status: Home / Self Care | Payer: Medicare Other | Source: Other Acute Inpatient Hospital | Attending: Internal Medicine | Admitting: Internal Medicine

## 2012-11-30 DIAGNOSIS — H409 Unspecified glaucoma: Secondary | ICD-10-CM | POA: Diagnosis present

## 2012-11-30 DIAGNOSIS — E119 Type 2 diabetes mellitus without complications: Secondary | ICD-10-CM | POA: Diagnosis present

## 2012-11-30 DIAGNOSIS — D62 Acute posthemorrhagic anemia: Secondary | ICD-10-CM | POA: Diagnosis present

## 2012-11-30 DIAGNOSIS — S065X9A Traumatic subdural hemorrhage with loss of consciousness of unspecified duration, initial encounter: Secondary | ICD-10-CM

## 2012-11-30 DIAGNOSIS — S065XAA Traumatic subdural hemorrhage with loss of consciousness status unknown, initial encounter: Principal | ICD-10-CM | POA: Diagnosis present

## 2012-11-30 DIAGNOSIS — Z66 Do not resuscitate: Secondary | ICD-10-CM | POA: Diagnosis present

## 2012-11-30 DIAGNOSIS — I10 Essential (primary) hypertension: Secondary | ICD-10-CM | POA: Diagnosis present

## 2012-11-30 DIAGNOSIS — N183 Chronic kidney disease, stage 3 unspecified: Secondary | ICD-10-CM | POA: Diagnosis present

## 2012-11-30 DIAGNOSIS — I6200 Nontraumatic subdural hemorrhage, unspecified: Secondary | ICD-10-CM

## 2012-11-30 DIAGNOSIS — IMO0002 Reserved for concepts with insufficient information to code with codable children: Secondary | ICD-10-CM

## 2012-11-30 DIAGNOSIS — Z9861 Coronary angioplasty status: Secondary | ICD-10-CM

## 2012-11-30 DIAGNOSIS — E876 Hypokalemia: Secondary | ICD-10-CM | POA: Diagnosis present

## 2012-11-30 DIAGNOSIS — E1122 Type 2 diabetes mellitus with diabetic chronic kidney disease: Secondary | ICD-10-CM | POA: Diagnosis present

## 2012-11-30 DIAGNOSIS — W010XXA Fall on same level from slipping, tripping and stumbling without subsequent striking against object, initial encounter: Secondary | ICD-10-CM | POA: Diagnosis present

## 2012-11-30 DIAGNOSIS — R55 Syncope and collapse: Secondary | ICD-10-CM

## 2012-11-30 DIAGNOSIS — S06309A Unspecified focal traumatic brain injury with loss of consciousness of unspecified duration, initial encounter: Secondary | ICD-10-CM | POA: Diagnosis present

## 2012-11-30 DIAGNOSIS — S06330A Contusion and laceration of cerebrum, unspecified, without loss of consciousness, initial encounter: Secondary | ICD-10-CM

## 2012-11-30 DIAGNOSIS — S0630AA Unspecified focal traumatic brain injury with loss of consciousness status unknown, initial encounter: Secondary | ICD-10-CM | POA: Diagnosis present

## 2012-11-30 DIAGNOSIS — D696 Thrombocytopenia, unspecified: Secondary | ICD-10-CM | POA: Diagnosis present

## 2012-11-30 DIAGNOSIS — I2510 Atherosclerotic heart disease of native coronary artery without angina pectoris: Secondary | ICD-10-CM | POA: Diagnosis present

## 2012-11-30 HISTORY — DX: Essential (primary) hypertension: I10

## 2012-11-30 HISTORY — DX: Unspecified glaucoma: H40.9

## 2012-11-30 HISTORY — DX: Type 2 diabetes mellitus without complications: E11.9

## 2012-11-30 HISTORY — DX: Atherosclerotic heart disease of native coronary artery without angina pectoris: I25.10

## 2012-11-30 LAB — BASIC METABOLIC PANEL
Anion Gap: 7 (ref 7–16)
Calcium, Total: 9 mg/dL (ref 8.5–10.1)
EGFR (Non-African Amer.): 55 — ABNORMAL LOW
Osmolality: 284 (ref 275–301)
Potassium: 3 mmol/L — ABNORMAL LOW (ref 3.5–5.1)

## 2012-11-30 LAB — PROTIME-INR
INR: 1
INR: 0.96 (ref 0.00–1.49)

## 2012-11-30 LAB — CBC WITH DIFFERENTIAL/PLATELET
Basophil %: 0.7 %
Eosinophil #: 0.5 10*3/uL (ref 0.0–0.7)
Eosinophil %: 5 %
Eosinophils Absolute: 0.1 10*3/uL (ref 0.0–0.7)
Eosinophils Relative: 1 % (ref 0–5)
HCT: 34 % — ABNORMAL LOW (ref 36.0–46.0)
Hemoglobin: 11.6 g/dL — ABNORMAL LOW (ref 12.0–15.0)
Lymphocyte #: 1.4 10*3/uL (ref 1.0–3.6)
Lymphs Abs: 1.2 10*3/uL (ref 0.7–4.0)
MCH: 32.7 pg (ref 26.0–34.0)
MCHC: 32.7 g/dL (ref 32.0–36.0)
MCV: 98 fL (ref 80–100)
MCV: 95.8 fL (ref 78.0–100.0)
Monocyte #: 0.5 x10 3/mm (ref 0.2–0.9)
Monocyte %: 5 %
Monocytes Absolute: 0.4 10*3/uL (ref 0.1–1.0)
Monocytes Relative: 4 % (ref 3–12)
Neutrophil #: 7.2 10*3/uL — ABNORMAL HIGH (ref 1.4–6.5)
Neutrophil %: 74.8 %
Platelet: 125 10*3/uL — ABNORMAL LOW (ref 150–440)
RBC: 3.55 MIL/uL — ABNORMAL LOW (ref 3.87–5.11)
RDW: 13.3 % (ref 11.5–14.5)
WBC: 9.7 10*3/uL (ref 3.6–11.0)

## 2012-11-30 LAB — TROPONIN I: Troponin-I: <0.02 ng/mL

## 2012-11-30 LAB — COMPREHENSIVE METABOLIC PANEL
Alkaline Phosphatase: 45 U/L (ref 39–117)
BUN: 23 mg/dL (ref 6–23)
Chloride: 97 mEq/L (ref 96–112)
Creatinine, Ser: 0.78 mg/dL (ref 0.50–1.10)
GFR calc Af Amer: 89 mL/min — ABNORMAL LOW (ref >90)
Glucose, Bld: 213 mg/dL — ABNORMAL HIGH (ref 70–99)
Potassium: 3.3 mEq/L — ABNORMAL LOW (ref 3.5–5.1)
Total Bilirubin: 0.5 mg/dL (ref 0.3–1.2)
Total Protein: 6.6 g/dL (ref 6.0–8.3)

## 2012-11-30 LAB — CK TOTAL AND CKMB (NOT AT ARMC): CK-MB: 3 ng/mL (ref 0.5–3.6)

## 2012-11-30 LAB — PHOSPHORUS: Phosphorus: 3.8 mg/dL (ref 2.3–4.6)

## 2012-11-30 LAB — URINALYSIS, COMPLETE
Bacteria: NONE SEEN
Bilirubin,UR: NEGATIVE
Glucose,UR: >=500 mg/dL (ref 0–75)
Hyaline Cast: 1
Ketone: NEGATIVE
Leukocyte Esterase: NEGATIVE
RBC,UR: <1 /HPF (ref 0–5)
Specific Gravity: 1.009 (ref 1.003–1.030)
Squamous Epithelial: 1
WBC UR: 1 /HPF (ref 0–5)

## 2012-11-30 LAB — MAGNESIUM: Magnesium: 1.7 mg/dL (ref 1.5–2.5)

## 2012-11-30 LAB — MRSA PCR SCREENING: MRSA by PCR: NEGATIVE

## 2012-11-30 LAB — LIPID PANEL
Cholesterol: 122 mg/dL (ref 0–200)
Total CHOL/HDL Ratio: 2.1 RATIO

## 2012-11-30 LAB — APTT: Activated PTT: 23 secs — ABNORMAL LOW (ref 23.6–35.9)

## 2012-11-30 MED ORDER — CLONIDINE HCL 0.1 MG PO TABS
0.1000 mg | ORAL_TABLET | Freq: Two times a day (BID) | ORAL | Status: DC
  Administered 2012-12-01 – 2012-12-04 (×8): 0.1 mg via ORAL
  Filled 2012-11-30 (×10): qty 1

## 2012-11-30 MED ORDER — INSULIN ASPART 100 UNIT/ML ~~LOC~~ SOLN
0.0000 [IU] | Freq: Three times a day (TID) | SUBCUTANEOUS | Status: DC
  Administered 2012-12-01 (×2): 5 [IU] via SUBCUTANEOUS
  Administered 2012-12-01: 2 [IU] via SUBCUTANEOUS
  Administered 2012-12-02: 5 [IU] via SUBCUTANEOUS
  Administered 2012-12-02: 2 [IU] via SUBCUTANEOUS

## 2012-11-30 MED ORDER — LEVOTHYROXINE SODIUM 50 MCG PO TABS
50.0000 ug | ORAL_TABLET | Freq: Every day | ORAL | Status: DC
  Administered 2012-12-01 – 2012-12-04 (×4): 50 ug via ORAL
  Filled 2012-11-30 (×5): qty 1

## 2012-11-30 MED ORDER — ACETAMINOPHEN 325 MG PO TABS
650.0000 mg | ORAL_TABLET | Freq: Four times a day (QID) | ORAL | Status: DC | PRN
  Administered 2012-12-01 – 2012-12-03 (×3): 650 mg via ORAL
  Filled 2012-11-30 (×3): qty 2

## 2012-11-30 MED ORDER — NICARDIPINE HCL IN NACL 20-0.86 MG/200ML-% IV SOLN
0.0000 mg/h | INTRAVENOUS | Status: DC
  Administered 2012-11-30: 5 mg/h via INTRAVENOUS
  Filled 2012-11-30: qty 200

## 2012-11-30 MED ORDER — SODIUM CHLORIDE 0.9 % IV SOLN: 250.0000 mL | INTRAVENOUS | Status: DC | PRN

## 2012-11-30 MED ORDER — ISOSORBIDE MONONITRATE ER 30 MG PO TB24
30.0000 mg | ORAL_TABLET | Freq: Every day | ORAL | Status: DC
  Administered 2012-12-01 – 2012-12-04 (×4): 30 mg via ORAL
  Filled 2012-11-30 (×4): qty 1

## 2012-11-30 MED ORDER — TRAMADOL HCL 50 MG PO TABS
50.0000 mg | ORAL_TABLET | Freq: Four times a day (QID) | ORAL | Status: DC | PRN
  Administered 2012-11-30 – 2012-12-02 (×6): 50 mg via ORAL
  Filled 2012-11-30 (×7): qty 1

## 2012-11-30 MED ORDER — METOPROLOL SUCCINATE ER 100 MG PO TB24
100.0000 mg | ORAL_TABLET | Freq: Two times a day (BID) | ORAL | Status: DC
  Administered 2012-12-01 – 2012-12-04 (×8): 100 mg via ORAL
  Filled 2012-11-30 (×10): qty 1

## 2012-11-30 MED ORDER — DOUBLE ANTIBIOTIC 500-10000 UNIT/GM EX OINT
TOPICAL_OINTMENT | Freq: Two times a day (BID) | CUTANEOUS | Status: DC
  Administered 2012-11-30: 1 via TOPICAL
  Administered 2012-12-01: 22:00:00 via TOPICAL
  Administered 2012-12-01: 1 via TOPICAL
  Administered 2012-12-02 – 2012-12-04 (×5): via TOPICAL
  Filled 2012-11-30: qty 1
  Filled 2012-11-30: qty 14.17
  Filled 2012-11-30 (×6): qty 1

## 2012-11-30 NOTE — Consult Note (Signed)
Reason for Consult: Closed head injury Referring Physician: Dr. Delila Dorsey Jody Dorsey is an 79 y.o. female.  HPI: Patient is an 79 year old individual who sustained a fall outside today she is seen in the emergency room at [facility] hospital was noted that she had evidence of contusions about the side of her head and a CT scan of the brain was performed. This revealed the presence of some parenchymal contusions on the right side of her head and a small very thin subdural hemorrhage. The patient has been on aspirin and Plavix. This is because she's had coronary stenting in the past. She is transferred here for further care.  No past medical history on file.  No past surgical history on file.  No family history on file.  Social History:  does not have a smoking history on file. She does not have any smokeless tobacco history on file. Her alcohol and drug histories not on file.  Allergies:  Allergies  Allergen Reactions  . Norvasc (Amlodipine Besylate)   . Celebrex (Celecoxib) Rash    Medications: I have reviewed the patient's current medications.  No results found for this or any previous visit (from the past 48 hour(s)).  No results found.  Review of Systems  : mental acuity   Blood pressure 125/54, pulse 94, temperature 99 F (37.2 C), temperature source Oral, resp. rate 18, height 5\' 5"  (1.651 m), weight 62.7 kg (138 lb 3.7 oz), SpO2 95.00%. Physical Exam  Constitutional: She appears well-developed and well-nourished.  HENT:       Ecchymosis and bruising about right side of face and ear  Eyes: Conjunctivae normal and EOM are normal. Pupils are equal, round, and reactive to light.  Neck: Neck supple.  Cardiovascular: Normal rate and regular rhythm.   Musculoskeletal:       Moves upper and lower extremities well no evidence of cortical drift.  Neurological: She is alert.       Oriented to hospital place and person but easily distracted and does not answer questions directly.    Skin: Skin is warm and dry.  Psychiatric: She has a normal mood and affect. Her behavior is normal. Judgment and thought content normal.    Assessment/Plan: Patient will be observed for any changes in her neurologic status repeat CT scan can be performed in the morning and so far there is no evidence of cervical spine injury the cervical collar can be removed. We will follow with you Jody Dorsey 11/30/2012, 9:48 PM

## 2012-11-30 NOTE — Progress Notes (Addendum)
Patient accepted to ICU for intracranial bleed from [facility], they discussed with Dr Danielle Dess and he recommended hospitalist admission ,but because  she is on a Cardene drip due SBP>200 , she was accepted to the ICU , discussed with Dr Vassie Loll who will see the patient first
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
## 2012-12-01 ENCOUNTER — Encounter (HOSPITAL_COMMUNITY): Payer: Self-pay | Admitting: *Deleted

## 2012-12-01 ENCOUNTER — Inpatient Hospital Stay (HOSPITAL_COMMUNITY): Payer: Medicare Other

## 2012-12-01 DIAGNOSIS — R55 Syncope and collapse: Secondary | ICD-10-CM

## 2012-12-01 DIAGNOSIS — S069X9A Unspecified intracranial injury with loss of consciousness of unspecified duration, initial encounter: Secondary | ICD-10-CM

## 2012-12-01 LAB — HEMOGLOBIN A1C: Mean Plasma Glucose: 186 mg/dL — ABNORMAL HIGH (ref <117)

## 2012-12-01 LAB — BASIC METABOLIC PANEL
BUN: 22 mg/dL (ref 6–23)
CO2: 28 mEq/L (ref 19–32)
Chloride: 99 mEq/L (ref 96–112)
Creatinine, Ser: 0.9 mg/dL (ref 0.50–1.10)
Glucose, Bld: 155 mg/dL — ABNORMAL HIGH (ref 70–99)

## 2012-12-01 LAB — GLUCOSE, CAPILLARY
Glucose-Capillary: 209 mg/dL — ABNORMAL HIGH (ref 70–99)
Glucose-Capillary: 158 mg/dL — ABNORMAL HIGH (ref 70–99)
Glucose-Capillary: 261 mg/dL — ABNORMAL HIGH (ref 70–99)
Glucose-Capillary: 292 mg/dL — ABNORMAL HIGH (ref 70–99)

## 2012-12-01 LAB — MAGNESIUM: Magnesium: 2.6 mg/dL — ABNORMAL HIGH (ref 1.5–2.5)

## 2012-12-01 MED ORDER — PANTOPRAZOLE SODIUM 40 MG IV SOLR
40.0000 mg | INTRAVENOUS | Status: DC
  Administered 2012-12-01: 40 mg via INTRAVENOUS
  Filled 2012-12-01 (×2): qty 40

## 2012-12-01 MED ORDER — MAGNESIUM SULFATE 40 MG/ML IJ SOLN
2.0000 g | Freq: Once | INTRAMUSCULAR | Status: AC
  Administered 2012-12-01: 2 g via INTRAVENOUS
  Filled 2012-12-01: qty 50

## 2012-12-01 MED ORDER — METFORMIN HCL 500 MG PO TABS
500.0000 mg | ORAL_TABLET | Freq: Two times a day (BID) | ORAL | Status: DC
  Administered 2012-12-01 – 2012-12-04 (×6): 500 mg via ORAL
  Filled 2012-12-01 (×8): qty 1

## 2012-12-01 MED ORDER — GLYBURIDE MICRONIZED 3 MG PO TABS
6.0000 mg | ORAL_TABLET | Freq: Two times a day (BID) | ORAL | Status: DC
  Administered 2012-12-01 – 2012-12-04 (×6): 6 mg via ORAL
  Filled 2012-12-01 (×8): qty 2

## 2012-12-01 MED ORDER — POTASSIUM CHLORIDE CRYS ER 20 MEQ PO TBCR
40.0000 meq | EXTENDED_RELEASE_TABLET | Freq: Once | ORAL | Status: AC
  Administered 2012-12-01: 40 meq via ORAL
  Filled 2012-12-01: qty 2

## 2012-12-01 NOTE — Progress Notes (Signed)
VASCULAR LAB PRELIMINARY  PRELIMINARY  PRELIMINARY  PRELIMINARY  Carotid duplex  completed.    Preliminary report:  Bilateral:  No evidence of hemodynamically significant internal carotid artery stenosis.   Vertebral artery flow is antegrade.      Deloyce Walthers, RVT 12/01/2012, 12:47 PM

## 2012-12-01 NOTE — Progress Notes (Signed)
eLink Physician-Brief Progress Note Patient Name: Jody Dorsey DOB: 1929/12/04 MRN: [id number]  Date of Service  12/01/2012   HPI/Events of Note  Best Practice  eICU Interventions  Protonix 40 mg IV daily for stress ulcer propy in the setting of head injury.   Intervention Category Intermediate Interventions: Best-practice therapies (e.g. DVT, beta blocker, etc.)  Nagi Furio 12/01/2012, 2:47 AM

## 2012-12-01 NOTE — Progress Notes (Signed)
Nutrition Brief Note  Patient identified on the Malnutrition Screening Tool (MST) Report.  Per record review, patient was 68.7 kg in January 2010; has had a 9% weight loss since then ---> not significant for time frame.  Body mass index is 23.00 kg/(m^2). Patient meets criteria for Normal based on current BMI.   Current diet order is Carbohydrate Modified Medium Calorie, patient is consuming approximately 100% of meals at this time.  Labs and medications reviewed.   No nutrition interventions warranted at this time. Please consult RD as needed.  Maureen Chatters, RD, LDN Pager #: [phone] After-Hours Pager #: [phone]

## 2012-12-01 NOTE — Progress Notes (Signed)
Pt arrived from 3100, VSS, neuro intact, will continue to monitor.

## 2012-12-01 NOTE — Progress Notes (Signed)
  Echocardiogram 2D Echocardiogram has been performed.  Georgian Co 12/01/2012, 5:53 PM
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
## 2012-12-01 NOTE — Progress Notes (Signed)
UR completed 

## 2012-12-01 NOTE — Progress Notes (Signed)
PULMONARY  / CRITICAL CARE MEDICINE  Name: Teesha Ohm MRN: [id number] DOB: [birth year]    LOS: 1  REFERRING MD :  Hospitalist Dr. Susie Cassette  CHIEF COMPLAINT:  Syncope, SDH  BRIEF PATIENT DESCRIPTION:  79 y.o PMH HTN, CAD with stents x 2, DM with possible syncopal episode leading to CT proven small tentorial SDH, multiple lacerations requiring sutures transferred from [place] to [facility] for further care. Started on a Nicardipine drip at [place] to control blood pressure initial BP 180s-200s at [place].    INTERVAL HISTORY:  C/O HA. Cognition intact. No new complaints. No distress  VITAL SIGNS: Temp:  [98.1 F (36.7 C)-98.3 F (36.8 C)] 98.3 F (36.8 C) (01/23 1528) Pulse Rate:  [71-98] 75  (01/23 1400) Resp:  [12-20] 16  (01/23 1400) BP: (102-156)/(43-118) 102/43 mmHg (01/23 1400) SpO2:  [93 %-100 %] 96 % (01/23 1400) HEMODYNAMICS:   VENTILATOR SETTINGS:   INTAKE / OUTPUT: Intake/Output      01/23 0701 - 01/24 0700   P.O. 360   I.V. (mL/kg) 40 (0.6)   IV Piggyback    Total Intake(mL/kg) 400 (6.4)   Urine (mL/kg/hr) 450 (0.5)   Total Output 450   Net -50       Urine Occurrence 1 x     PHYSICAL EXAMINATION: VS HR 97, 98%, RR 20s, 133/67 General: NAD Neuro: No focal deficits, cognition intact HEENT:  Multiple abrasions, ecchymosis to face, sutures to face Cardiovascular: +systolic murmur Lungs:  ctab Abdomen:  Soft, NABS Musculoskeletal: No edema  Skin:  Multiple abrasions and lacerations to face, hands    LABS:  Lab 12/01/12 0942 12/01/12 0432 11/30/12 2302 11/30/12 2255  HGB -- -- 11.6* --  WBC -- -- 9.5 --  PLT -- -- 147* --  NA -- 137 136 --  K -- 3.5 3.3* --  CL -- 99 97 --  CO2 -- 28 25 --  GLUCOSE -- 155* 213* --  BUN -- 22 23 --  CREATININE -- 0.90 0.78 --  CALCIUM -- 9.4 9.3 --  MG -- 2.6* 1.7 --  PHOS -- 3.8 3.8 --  AST -- -- 20 --  ALT -- -- 13 --  ALKPHOS -- -- 45 --  BILITOT -- -- 0.5 --  PROT -- -- 6.6 --  ALBUMIN -- -- 3.7 --   INR -- -- 0.96 --  APTT -- -- -- --  INR -- -- 0.96 --  LATICACIDVEN -- -- -- --  TROPONINI <0.30 <0.30 -- <0.30  PHART -- -- -- --  PCO2ART -- -- -- --  PO2ART -- -- -- --  HCO3 -- -- -- --  O2SAT -- -- -- --   CBG trend  Lab 12/01/12 1527 12/01/12 1153 12/01/12 0825 12/01/12 0005  GLUCAP 292* 261* 158* 209*    IMAGING: 1/22 CXR [place] negative 1/22 CT head [place] small tentorial SDH, bilateral parenchymal hemorrhages. No fracture 1/22 Pelvis AP: bilateral degenerative changes. No acute abnormality. Calcifications in pelvis consistent with phleboliths  1/22 CT cervical spine: degenerative change. No evidence of fracture   ECG: 1/22 [place]: Normal sinus rhythm. LAD, no LVH, Rate <100, no acute ST changes, QRS interval normal, PR interval normal; possible T wave inversions V1-V2 ? Left atrial enlargement.   DIAGNOSES: Principal Problem:  *Closed head injury with petechial brain hemorrhage Active Problems:  SDH (subdural hematoma)  Diabetes mellitus  Laceration   PLAN: Transfer to SDU Change neuro checks to q 4 hrs Resume outpt DM meds  Advance activity Discussed with Dr Butler Denmark.  TRH to assume care as of AM 1/24 and PCCM will s/o Complete syncope eval  Echo and carotid duplex results pending  Billy Fischer, MD ; [facility] service Mobile [phone].  After 5:30 PM or weekends, call [phone]

## 2012-12-01 NOTE — Progress Notes (Signed)
PULMONARY  / CRITICAL CARE MEDICINE  Name: Jody Dorsey MRN: [id number] DOB: 1930-08-13    LOS: 1  REFERRING MD :  Hospitalist Dr. Susie Cassette  CHIEF COMPLAINT:  Syncope, SDH  BRIEF PATIENT DESCRIPTION:  79 y.o PMH HTN, CAD with stents x 2, DM with possible syncopal episode leading to CT proven small tentorial SDH, multiple lacerations requiring sutures transferred from [place] to [facility] for further care. Started on a Nicardipine drip at [place] to control blood pressure initial BP 180s-200s at [place].    INTERVAL HISTORY:  C/O HA. Cognition intact. No new complaints. No distress  VITAL SIGNS: Temp:  [98.1 F (36.7 C)-98.3 F (36.8 C)] 98.3 F (36.8 C) (01/23 1528) Pulse Rate:  [71-98] 75  (01/23 1400) Resp:  [12-20] 16  (01/23 1400) BP: (102-156)/(43-118) 102/43 mmHg (01/23 1400) SpO2:  [93 %-100 %] 96 % (01/23 1400) HEMODYNAMICS:   VENTILATOR SETTINGS:   INTAKE / OUTPUT: Intake/Output      01/23 0701 - 01/24 0700   P.O. 360   I.V. (mL/kg) 40 (0.6)   IV Piggyback    Total Intake(mL/kg) 400 (6.4)   Urine (mL/kg/hr) 450 (0.5)   Total Output 450   Net -50       Urine Occurrence 1 x     PHYSICAL EXAMINATION: VS HR 97, 98%, RR 20s, 133/67 General: NAD Neuro: No focal deficits, cognition intact HEENT:  Multiple abrasions, ecchymosis to face, sutures to face Cardiovascular: +systolic murmur Lungs:  ctab Abdomen:  Soft, NABS Musculoskeletal: No edema  Skin:  Multiple abrasions and lacerations to face, hands    LABS:  Lab 12/01/12 0942 12/01/12 0432 11/30/12 2302 11/30/12 2255  HGB -- -- 11.6* --  WBC -- -- 9.5 --  PLT -- -- 147* --  NA -- 137 136 --  K -- 3.5 3.3* --  CL -- 99 97 --  CO2 -- 28 25 --  GLUCOSE -- 155* 213* --  BUN -- 22 23 --  CREATININE -- 0.90 0.78 --  CALCIUM -- 9.4 9.3 --  MG -- 2.6* 1.7 --  PHOS -- 3.8 3.8 --  AST -- -- 20 --  ALT -- -- 13 --  ALKPHOS -- -- 45 --  BILITOT -- -- 0.5 --  PROT -- -- 6.6 --  ALBUMIN -- -- 3.7 --   INR -- -- 0.96 --  APTT -- -- -- --  INR -- -- 0.96 --  LATICACIDVEN -- -- -- --  TROPONINI <0.30 <0.30 -- <0.30  PHART -- -- -- --  PCO2ART -- -- -- --  PO2ART -- -- -- --  HCO3 -- -- -- --  O2SAT -- -- -- --   CBG trend  Lab 12/01/12 1527 12/01/12 1153 12/01/12 0825 12/01/12 0005  GLUCAP 292* 261* 158* 209*    IMAGING: 1/22 CXR [place] negative 1/22 CT head [place] small tentorial SDH, bilateral parenchymal hemorrhages. No fracture 1/22 Pelvis AP: bilateral degenerative changes. No acute abnormality. Calcifications in pelvis consistent with phleboliths  1/22 CT cervical spine: degenerative change. No evidence of fracture   ECG: 1/22 [place]: Normal sinus rhythm. LAD, no LVH, Rate <100, no acute ST changes, QRS interval normal, PR interval normal; possible T wave inversions V1-V2 ? Left atrial enlargement.   DIAGNOSES: Principal Problem:  *Closed head injury with petechial brain hemorrhage Active Problems:  SDH (subdural hematoma)  Diabetes mellitus  Laceration   PLAN: Transfer to SDU Change neuro checks to q 4 hrs Resume outpt DM meds  Advance activity Discussed with Dr Butler Denmark.  TRH to assume care as of AM 1/24 and PCCM will s/o   Billy Fischer, MD ; [facility] [phone].  After 5:30 PM or weekends, call [phone]

## 2012-12-02 LAB — GLUCOSE, CAPILLARY
Glucose-Capillary: 276 mg/dL — ABNORMAL HIGH (ref 70–99)
Glucose-Capillary: 223 mg/dL — ABNORMAL HIGH (ref 70–99)

## 2012-12-02 MED ORDER — INSULIN ASPART 100 UNIT/ML ~~LOC~~ SOLN
0.0000 [IU] | Freq: Three times a day (TID) | SUBCUTANEOUS | Status: DC
  Administered 2012-12-03 (×2): 3 [IU] via SUBCUTANEOUS
  Administered 2012-12-03: 11 [IU] via SUBCUTANEOUS
  Administered 2012-12-04 (×2): 5 [IU] via SUBCUTANEOUS

## 2012-12-02 MED ORDER — TROLAMINE SALICYLATE 10 % EX CREA: TOPICAL_CREAM | Freq: Two times a day (BID) | CUTANEOUS | Status: DC | PRN

## 2012-12-02 MED ORDER — TELMISARTAN-HCTZ 80-25 MG PO TABS: 1.0000 | ORAL_TABLET | Freq: Every day | ORAL | Status: DC

## 2012-12-02 MED ORDER — LACTULOSE 10 GM/15ML PO SOLN
20.0000 g | Freq: Two times a day (BID) | ORAL | Status: DC | PRN
  Filled 2012-12-02 (×2): qty 30

## 2012-12-02 MED ORDER — INSULIN ASPART 100 UNIT/ML ~~LOC~~ SOLN
0.0000 [IU] | Freq: Three times a day (TID) | SUBCUTANEOUS | Status: DC
  Administered 2012-12-02: 5 [IU] via SUBCUTANEOUS

## 2012-12-02 MED ORDER — INSULIN ASPART 100 UNIT/ML ~~LOC~~ SOLN: 0.0000 [IU] | Freq: Three times a day (TID) | SUBCUTANEOUS | Status: DC

## 2012-12-02 MED ORDER — SENNOSIDES-DOCUSATE SODIUM 8.6-50 MG PO TABS
1.0000 | ORAL_TABLET | Freq: Two times a day (BID) | ORAL | Status: DC
  Administered 2012-12-02: 1 via ORAL
  Filled 2012-12-02: qty 1

## 2012-12-02 MED ORDER — ATORVASTATIN CALCIUM 10 MG PO TABS
10.0000 mg | ORAL_TABLET | Freq: Every day | ORAL | Status: DC
  Administered 2012-12-02 – 2012-12-03 (×2): 10 mg via ORAL
  Filled 2012-12-02 (×3): qty 1

## 2012-12-02 MED ORDER — MUSCLE RUB 10-15 % EX CREA
TOPICAL_CREAM | Freq: Two times a day (BID) | CUTANEOUS | Status: DC | PRN
  Administered 2012-12-03 – 2012-12-04 (×3): via TOPICAL
  Filled 2012-12-02: qty 85

## 2012-12-02 MED ORDER — INSULIN ASPART 100 UNIT/ML ~~LOC~~ SOLN: 0.0000 [IU] | Freq: Every day | SUBCUTANEOUS | Status: DC

## 2012-12-02 MED ORDER — ONDANSETRON HCL 4 MG/2ML IJ SOLN
4.0000 mg | Freq: Four times a day (QID) | INTRAMUSCULAR | Status: DC | PRN
  Administered 2012-12-02: 4 mg via INTRAVENOUS

## 2012-12-02 MED ORDER — HYDROCHLOROTHIAZIDE 25 MG PO TABS
25.0000 mg | ORAL_TABLET | Freq: Every day | ORAL | Status: DC
  Administered 2012-12-02 – 2012-12-04 (×3): 25 mg via ORAL
  Filled 2012-12-02 (×3): qty 1

## 2012-12-02 MED ORDER — AMLODIPINE BESYLATE 10 MG PO TABS
10.0000 mg | ORAL_TABLET | Freq: Every day | ORAL | Status: DC
  Administered 2012-12-02 – 2012-12-04 (×3): 10 mg via ORAL
  Filled 2012-12-02 (×3): qty 1

## 2012-12-02 MED ORDER — IRBESARTAN 300 MG PO TABS
300.0000 mg | ORAL_TABLET | Freq: Every day | ORAL | Status: DC
  Administered 2012-12-02 – 2012-12-04 (×3): 300 mg via ORAL
  Filled 2012-12-02 (×3): qty 1

## 2012-12-02 MED ORDER — ONDANSETRON HCL 4 MG/2ML IJ SOLN
INTRAMUSCULAR | Status: AC
  Administered 2012-12-02: 4 mg via INTRAVENOUS
  Filled 2012-12-02: qty 2

## 2012-12-02 MED ORDER — POTASSIUM CHLORIDE CRYS ER 20 MEQ PO TBCR
40.0000 meq | EXTENDED_RELEASE_TABLET | ORAL | Status: AC
  Administered 2012-12-02 (×2): 40 meq via ORAL
  Filled 2012-12-02 (×4): qty 2

## 2012-12-02 NOTE — Progress Notes (Signed)
Pt admitted to 4N3. Alert and oriented.  Pt denies pain at present.  Daughter at bedside. Bed alarm on.

## 2012-12-02 NOTE — Progress Notes (Addendum)
[facility] Progress Note [place] TEAM 1 - Stepdown/ICU TEAM   Nickole Adamek ZOX:[id number] DOB: May 27, 1930 DOA: 11/30/2012 PCP: Default, Provider, MD  Brief narrative: 79 y.o PMH HTN, CAD with stents x 2, DM with possible syncopal episode leading to CT proven small tentorial SDH, multiple lacerations requiring sutures transferred from [place] to [facility] for further care. Started on a Nicardipine drip at [place] to control blood pressure initial BP 180s-200s at [place].   Assessment/Plan:  Closed head injury / SDH + SAH NS has seen and has since signed off - no intervention needed - will opt to hold all anticoag for 7 days - have discussed this with the pt and her daughter, who voice understanding   Fall w/ multiple lacerations I have discussed the circumstances of the patient's accident in great detail with her and her daughter - the daughter believes, based on her investigation, that the patient actually slipped and fell in her icey/snow covered driveway, striking her head and then becoming unconscious - the details that she provides do indeed fit the clinical picture - as a result I feel it is most likely that the patient did not in fact suffer a syncopal episode but rather slipped and fell - her carotid Dopplers and echocardiogram are unrevealing - as a result I do not feel that further syncope workup is required  Malignant HTN Required cardene gtt initially in setting of ICH - BP trending back up - titrate meds and follow   Hypokalemia Continue to replace and follow trend - magnesium is normal  Normocytic anemia Recheck hemoglobin in a.m.  DM CBG elevated - adjust tx and follow trend   CAD s/p PTCA/Stent x2 2008 The plan at this time is to resume aspirin and Plavix therapy in 7 days - we will also refer the patient back to her cardiologist to determine if the ongoing use of both of these therapies as indicated given the amount of time that has passed since her stent  placement  Code Status: DNR/NO CODE BLUE Family Communication:  Disposition Plan: transfer to tele bed - PT/OT   Consultants: PCCM Neurosurgery Trauma Surgery  Procedures: Carotid dopplers - 1/23 - unrevealing TTE - 1/23 - EF 60-65% - grade 1 DD - no signif AoS or other valvular disease  Antibiotics: none  DVT prophylaxis: SCDs   HPI/Subjective: The patient is resting comfortably.  She complains of some pain in her right thigh.  She denies acute visual change.  She denies focal weakness.  She denies headache.  She is alert and oriented.   Objective: Blood pressure 169/64, pulse 76, temperature 97.8 F (36.6 C), temperature source Oral, resp. rate 25, height 5\' 5"  (1.651 m), weight 62.7 kg (138 lb 3.7 oz), SpO2 100.00%.  Intake/Output Summary (Last 24 hours) at 12/02/12 1454 Last data filed at 12/02/12 0830  Gross per 24 hour  Intake    360 ml  Output    151 ml  Net    209 ml     Exam: General: No acute respiratory distress - ecchymosis of right lateral forehead with hematoma approximate size of 1/2 ping-pong ball in this region Lungs: Clear to auscultation bilaterally without wheezes or crackles Cardiovascular: Regular rate and rhythm without gallop or rub - 2/6 holosystolic murmur (patient reports is chronic) Abdomen: Nontender, nondistended, soft, bowel sounds positive, no rebound, no ascites, no appreciable mass Extremities: No significant cyanosis, clubbing, or edema bilateral lower extremities  Data Reviewed: Basic Metabolic Panel:  Lab 12/01/12 8119  11/30/12 2302  NA 137 136  K 3.5 3.3*  CL 99 97  CO2 28 25  GLUCOSE 155* 213*  BUN 22 23  CREATININE 0.90 0.78  CALCIUM 9.4 9.3  MG 2.6* 1.7  PHOS 3.8 3.8   Liver Function Tests:  Lab 11/30/12 2302  AST 20  ALT 13  ALKPHOS 45  BILITOT 0.5  PROT 6.6  ALBUMIN 3.7   CBC:  Lab 11/30/12 2302  WBC 9.5  NEUTROABS 7.8*  HGB 11.6*  HCT 34.0*  MCV 95.8  PLT 147*   Cardiac Enzymes:  Lab 12/01/12  0942 12/01/12 0432 11/30/12 2255  CKTOTAL -- -- --  CKMB -- -- --  CKMBINDEX -- -- --  TROPONINI <0.30 <0.30 <0.30   BNP (last 3 results)  Basename 11/30/12 2255  PROBNP 908.4*   CBG:  Lab 12/02/12 0809 12/01/12 2154 12/01/12 1527 12/01/12 1153 12/01/12 0825  GLUCAP 160* 247* 292* 261* 158*    Recent Results (from the past 240 hour(s))  MRSA PCR SCREENING     Status: Normal   Collection Time   11/30/12  8:26 PM      Component Value Range Status Comment   MRSA by PCR NEGATIVE  NEGATIVE Final      Studies:  Recent x-ray studies have been reviewed in detail by the Attending Physician  Scheduled Meds:  Reviewed in detail by the Attending Physician   Lonia Blood, MD [facility] Office  [phone] Pager [phone]  On-Call/Text Page:      [email or web address]      password [email or web address]  If 7PM-7AM, please contact night-coverage [email or web address] Password [email or web address] 12/02/2012, 2:54 PM   LOS: 2 days

## 2012-12-02 NOTE — Progress Notes (Signed)
Patient ID: Jody Dorsey, female   DOB: [birth year], 79 y.o.   MRN: [id number] Patient appears neurologically stable motor function appears intact. Lacerations and contusions externally appear to be healing nicely.  At this time I'll sign off the patient's care please reconsult should further needs be identified.

## 2012-12-02 NOTE — Progress Notes (Signed)
Inpatient Diabetes Program Recommendations  AACE/ADA: New Consensus Statement on Inpatient Glycemic Control (2013)  Target Ranges:  Prepandial:   less than 140 mg/dL      Peak postprandial:   less than 180 mg/dL (1-2 hours)      Critically ill patients:  140 - 180 mg/dL  Results for Jody Dorsey, Jody Dorsey (MRN [id number]) as of 12/02/2012 14:06  Ref. Range 12/01/2012 11:53 12/01/2012 15:27 12/01/2012 21:54 12/02/2012 08:09  Glucose-Capillary Latest Range: 70-99 mg/dL 782 (H) 956 (H) 213 (H) 160 (H)    Inpatient Diabetes Program Recommendations Insulin - Meal Coverage: add Novolog 3 units with meals  Thank you  Piedad Climes BSN, RN,CDE Inpatient Diabetes Coordinator [phone] (team pager)

## 2012-12-02 NOTE — Progress Notes (Signed)
Pt tx 4N per MD order, pt VSS, pt and family verbalized understanding of tx, report called to receiving RN, all questions answered

## 2012-12-03 LAB — BASIC METABOLIC PANEL
BUN: 21 mg/dL (ref 6–23)
CO2: 30 mEq/L (ref 19–32)
Chloride: 101 mEq/L (ref 96–112)
Creatinine, Ser: 0.85 mg/dL (ref 0.50–1.10)

## 2012-12-03 LAB — GLUCOSE, CAPILLARY
Glucose-Capillary: 318 mg/dL — ABNORMAL HIGH (ref 70–99)
Glucose-Capillary: 187 mg/dL — ABNORMAL HIGH (ref 70–99)
Glucose-Capillary: 151 mg/dL — ABNORMAL HIGH (ref 70–99)

## 2012-12-03 LAB — CBC
HCT: 31 % — ABNORMAL LOW (ref 36.0–46.0)
MCV: 96.6 fL (ref 78.0–100.0)
Platelets: 126 10*3/uL — ABNORMAL LOW (ref 150–400)
RBC: 3.21 MIL/uL — ABNORMAL LOW (ref 3.87–5.11)
WBC: 5.6 10*3/uL (ref 4.0–10.5)

## 2012-12-03 NOTE — Evaluation (Signed)
Occupational Therapy Evaluation Patient Details Name: Jody Dorsey MRN: [id number] DOB: May 22, 1930 Today's Date: 12/03/2012 Time: 8119-1478 OT Time Calculation (min): 26 min  OT Assessment / Plan / Recommendation Clinical Impression  Pt admitted with SDH due to fall at home.  Pt will benefit from acute OT services to address below problem list in prep for return home with 24/7 supervision.    OT Assessment  Patient needs continued OT Services    Follow Up Recommendations  No OT follow up;Supervision/Assistance - 24 hour    Barriers to Discharge None    Equipment Recommendations  None recommended by OT    Recommendations for Other Services    Frequency  Min 2X/week    Precautions / Restrictions Precautions Precautions: Fall   Pertinent Vitals/Pain See vitals    ADL  Grooming: Performed;Wash/dry hands;Supervision/safety Where Assessed - Grooming: Unsupported standing Lower Body Bathing: Simulated;Supervision/safety Where Assessed - Lower Body Bathing: Supported sit to stand Lower Body Dressing: Performed;Supervision/safety Where Assessed - Lower Body Dressing: Supported sit to Pharmacist, hospital: Performed;Minimal Dentist Method: Sit to Barista: Comfort height toilet Toileting - Clothing Manipulation and Hygiene: Performed;Supervision/safety Where Assessed - Engineer, mining and Hygiene: Sit to stand from 3-in-1 or toilet Equipment Used: Gait belt;Rolling walker Transfers/Ambulation Related to ADLs: Min assist with HHA x1 during ambulation to bathroom.   Pt min guard when ambulating with RW.  Slow steps but no LOB. ADL Comments: Educated pt and daughter for pt to have someone present when performing tub transfers and to use shower chair to help minimize fall risk while bathing.  Pt and daughter verbalized understanding. Pt requiring increased time to complete tasks due to feeling sore on right side of body due to  fall.    OT Diagnosis: Generalized weakness  OT Problem List: Decreased strength;Decreased activity tolerance;Impaired balance (sitting and/or standing);Decreased knowledge of use of DME or AE OT Treatment Interventions: Self-care/ADL training;DME and/or AE instruction;Therapeutic activities;Patient/family education;Balance training   OT Goals Acute Rehab OT Goals OT Goal Formulation: With patient/family Time For Goal Achievement: 12/10/12 Potential to Achieve Goals: Good ADL Goals Pt Will Transfer to Toilet: with supervision;Ambulation;with DME;Comfort height toilet ADL Goal: Toilet Transfer - Progress: Goal set today Pt Will Perform Tub/Shower Transfer: Tub transfer;with supervision;Ambulation;with DME;Shower seat with back ADL Goal: Web designer - Progress: Goal set today Miscellaneous OT Goals Miscellaneous OT Goal #1: Pt will consistently demonstrate safe use of RW at supervision level during all functional mobility. OT Goal: Miscellaneous Goal #1 - Progress: Goal set today  Visit Information  Last OT Received On: 12/03/12 Assistance Needed: +1    Subjective Data      Prior Functioning     Home Living Lives With: Alone Available Help at Discharge: Family;Friend(s);Available 24 hours/day (daughter lives next door) Type of Home: House Home Access: Stairs to enter [employer] of Steps: 3 Entrance Stairs-Rails: Right Home Layout: One level Bathroom Shower/Tub: Tub/shower unit;Curtain Bathroom Toilet: Handicapped height Home Adaptive Equipment: Grab bars in shower;Straight cane;Walker - rolling;Shower chair with back Prior Function Level of Independence: Independent Able to Take Stairs?: Yes Driving: Yes Vocation: Retired Musician: No difficulties Dominant Hand: Right         Vision/Perception     Cognition  Overall Cognitive Status: Appears within functional limits for tasks assessed/performed Arousal/Alertness:  Awake/alert Orientation Level: Appears intact for tasks assessed Behavior During Session: [facility] for tasks performed    Extremity/Trunk Assessment Right Upper Extremity Assessment RUE ROM/Strength/Tone: [facility] for tasks assessed  Left Upper Extremity Assessment LUE ROM/Strength/Tone: [facility] for tasks assessed     Mobility Bed Mobility Bed Mobility: Supine to Sit;Sitting - Scoot to Edge of Bed Supine to Sit: 6: Modified independent (Device/Increase time) Sitting - Scoot to Edge of Bed: 6: Modified independent (Device/Increase time) Transfers Transfers: Sit to Stand;Stand to Sit Sit to Stand: 4: Min assist;With upper extremity assist;From bed;From toilet Stand to Sit: 4: Min assist;With upper extremity assist;To chair/3-in-1;To toilet Details for Transfer Assistance: Min assist for stability and safety. Cues for safe hand placement to/from RW     Shoulder Instructions     Exercise     Balance     End of Session OT - End of Session Equipment Utilized During Treatment: Gait belt Activity Tolerance: Patient tolerated treatment well Patient left: in chair;with call bell/phone within reach;with family/visitor present Nurse Communication: Mobility status  GO    12/03/2012 Cipriano Mile OTR/L Pager [phone] Office [phone]  Cipriano Mile 12/03/2012, 1:17 PM

## 2012-12-03 NOTE — Progress Notes (Signed)
[facility] PROGRESS NOTE  Jody Dorsey WUJ:[id number] DOB: [birth year] DOA: 11/30/2012 PCP: Default, Provider, MD Brief narrative:  79 y.o PMH HTN, CAD with stents x 2, DM with possible syncopal episode leading to CT proven small tentorial SDH, multiple lacerations requiring sutures transferred from [place] to [facility] for further care. Started on a Nicardipine drip at [place] to control blood pressure initial BP 180s-200s at [place].   Assessment/Plan: Closed head injury / SDH + [facility], Dr. Danielle Dess has signed off. No intervention needed.  Discussed with Dr. Wynetta Emery neurosurgery about when to resume aspirin and plavix.  He recommended repeating head CT tomorrow, if bleeding is resolved then to resume aspirin in 7 days.  Uncertain if patient needs to be on Plavix this time, will have the patient follow up with her primary care physician through determine if and when Plavix needs to be resumed.    Fall w/ multiple lacerations  Dr. Sharon Seller discussed the circumstances of the patient's accident in great detail with her and her daughter. The daughter believes, based on her investigation, that the patient actually slipped and fell in her icey/snow covered driveway, striking her head and then becoming unconscious. The details that she provides do indeed fit the clinical picture, patient likely did not suffer a syncopal episode, but rather had a mechanical fall.  Carotid Dopplers and echocardiogram are unrevealing.   Malignant HTN  Required cardene gtt initially in setting of ICH.  Blood pressure improved.  Now back on home antihypertensive medications.  Continue to monitor.  Hypokalemia  Resolved with replacement.  Normocytic anemia  Hemoglobin stable continue to monitor.  DM  CBG elevated.  Sliding scale insulin.  On home glyburide and metformin.  CAD s/p PTCA/Stent x2 2008  The plan at this time is to resume aspirin in 7 days.  Patient to followup with her primary care  physician to discuss when to resume Plavix.  Patient should also follow up with her cardiologist to determine if the ongoing use of both of these therapies as indicated given the amount of time that has passed since her stent placement   Code Status: DNR/NO CODE BLUE  Family Communication: No family at bedside. Disposition Plan: Pending.  Consider possible discharge tomorrow.   Consultants:  PCCM  Neurosurgery  Trauma Surgery   Procedures:  Carotid dopplers - 1/23 - unrevealing  TTE - 1/23 - EF 60-65% - grade 1 DD - no signif AoS or other valvular disease   Antibiotics:  none   DVT prophylaxis:  SCDs   HPI/Subjective: No specific concerns.  Feeling improved overall.  Objective: Filed Vitals:   12/03/12 0231 12/03/12 0244 12/03/12 0619 12/03/12 0941  BP: 142/54 140/60 140/49 134/52  Pulse: 71 81 71 73  Temp: 97.9 F (36.6 C) 97.8 F (36.6 C) 98.3 F (36.8 C) 97.8 F (36.6 C)  TempSrc: Oral Oral Oral Oral  Resp: 18 18 18 18   Height:      Weight:      SpO2: 97% 94% 96% 100%    Intake/Output Summary (Last 24 hours) at 12/03/12 1355 Last data filed at 12/03/12 0815  Gross per 24 hour  Intake    540 ml  Output      1 ml  Net    539 ml   Filed Weights   11/30/12 2011  Weight: 62.7 kg (138 lb 3.7 oz)    Exam: Physical Exam: General: Awake, Oriented, No acute distress. HEENT: EOMI. Ecchymosis over the right lateral forehead with hematoma.  Neck: Supple CV: S1 and S2 Lungs: Clear to ascultation bilaterally Abdomen: Soft, Nontender, Nondistended, +bowel sounds. Ext: Good pulses. Trace edema. Skin: Multiple bruising on hands bilaterally.  Data Reviewed: Basic Metabolic Panel:  Lab 12/03/12 4098 12/01/12 0432 11/30/12 2302  NA 139 137 136  K 4.5 3.5 3.3*  CL 101 99 97  CO2 30 28 25   GLUCOSE 255* 155* 213*  BUN 21 22 23   CREATININE 0.85 0.90 0.78  CALCIUM 9.6 9.4 9.3  MG -- 2.6* 1.7  PHOS -- 3.8 3.8   Liver Function Tests:  Lab 11/30/12 2302  AST 20    ALT 13  ALKPHOS 45  BILITOT 0.5  PROT 6.6  ALBUMIN 3.7   No results found for this basename: LIPASE:5,AMYLASE:5 in the last 168 hours No results found for this basename: AMMONIA:5 in the last 168 hours CBC:  Lab 12/03/12 0605 11/30/12 2302  WBC 5.6 9.5  NEUTROABS -- 7.8*  HGB 10.3* 11.6*  HCT 31.0* 34.0*  MCV 96.6 95.8  PLT 126* 147*   Cardiac Enzymes:  Lab 12/01/12 0942 12/01/12 0432 11/30/12 2255  CKTOTAL -- -- --  CKMB -- -- --  CKMBINDEX -- -- --  TROPONINI <0.30 <0.30 <0.30   BNP (last 3 results)  Basename 11/30/12 2255  PROBNP 908.4*   CBG:  Lab 12/03/12 1140 12/03/12 0759 12/02/12 2148 12/02/12 1718 12/02/12 1125  GLUCAP 166* 318* 184* 223* 276*    Recent Results (from the past 240 hour(s))  MRSA PCR SCREENING     Status: Normal   Collection Time   11/30/12  8:26 PM      Component Value Range Status Comment   MRSA by PCR NEGATIVE  NEGATIVE Final      Studies: No results found.  Scheduled Meds:   . amLODipine  10 mg Oral Daily  . atorvastatin  10 mg Oral q1800  . cloNIDine  0.1 mg Oral BID  . glyBURIDE micronized  6 mg Oral BID AC  . hydrochlorothiazide  25 mg Oral Daily  . insulin aspart  0-15 Units Subcutaneous TID WC  . insulin aspart  0-5 Units Subcutaneous QHS  . irbesartan  300 mg Oral Daily  . isosorbide mononitrate  30 mg Oral Daily  . levothyroxine  50 mcg Oral QAC breakfast  . metFORMIN  500 mg Oral BID WC  . metoprolol succinate  100 mg Oral BID  . polymixin-bacitracin   Topical BID  . [COMPLETED] potassium chloride  40 mEq Oral Q4H  . senna-docusate  1 tablet Oral BID   Continuous Infusions:   Principal Problem:  *Closed head injury with petechial brain hemorrhage Active Problems:  SDH (subdural hematoma)  Diabetes mellitus  Laceration    Jody Dorsey A, MD  [facility] Pager [phone]. If 7PM-7AM, please contact night-coverage at [email or web address], password [facility] 12/03/2012, 1:55 PM  LOS: 3 days

## 2012-12-03 NOTE — Evaluation (Signed)
Physical Therapy Evaluation Patient Details Name: Jody Dorsey MRN: [id number] DOB: [birth year] Today's Date: 12/03/2012 Time: 4098-1191 PT Time Calculation (min): 31 min  PT Assessment / Plan / Recommendation Clinical Impression  Pt admitted s/p fall with SDH. Pt currently with decreased balance and functional mobility. Pt will benefit from skilled PT in the acute care setting in order to maximize functional mobility and safety. Spoke with pt and family regarding increased fall risk as well as need for 24 hr assist upon d/c    PT Assessment  Patient needs continued PT services    Follow Up Recommendations  No PT follow up;Supervision/Assistance - 24 hour    Does the patient have the potential to tolerate intense rehabilitation      Barriers to Discharge        Equipment Recommendations  Rolling walker with 5" wheels    Recommendations for Other Services     Frequency Min 3X/week    Precautions / Restrictions Precautions Precautions: Fall Restrictions Weight Bearing Restrictions: No   Pertinent Vitals/Pain Pain in RLE 4/10.       Mobility  Bed Mobility Bed Mobility: Supine to Sit;Sitting - Scoot to Edge of Bed Supine to Sit: 6: Modified independent (Device/Increase time) Sitting - Scoot to Edge of Bed: 6: Modified independent (Device/Increase time) Transfers Transfers: Sit to Stand;Stand to Sit Sit to Stand: 4: Min assist;With upper extremity assist;From bed;From toilet Stand to Sit: 4: Min assist;With upper extremity assist;To chair/3-in-1;To toilet Details for Transfer Assistance: Min assist for stability and safety. Cues for safe hand placement to/from RW Ambulation/Gait Ambulation/Gait Assistance: 4: Min assist;4: Min Government social research officer (Feet): 75 Feet Assistive device: Rolling walker;1 person hand held assist Ambulation/Gait Assistance Details: Min assist with no AD as pt unsteady with HHA. Minguard assist with RW. Decreased weight bearing and movement  of RLE secondary to pain. Gait Pattern: Decreased stance time - right;Decreased step length - left;Decreased hip/knee flexion - right;Step-to pattern;Decreased weight shift to right Gait velocity: slow Stairs: No    Shoulder Instructions     Exercises     PT Diagnosis: Difficulty walking;Acute pain  PT Problem List: Decreased strength;Decreased activity tolerance;Decreased balance;Decreased mobility;Decreased knowledge of use of DME;Decreased safety awareness;Decreased knowledge of precautions;Pain PT Treatment Interventions: DME instruction;Gait training;Stair training;Functional mobility training;Therapeutic activities;Balance training;Patient/family education   PT Goals Acute Rehab PT Goals PT Goal Formulation: With patient/family Time For Goal Achievement: 12/10/12 Potential to Achieve Goals: Good Pt will go Sit to Stand: with modified independence PT Goal: Sit to Stand - Progress: Goal set today Pt will go Stand to Sit: with modified independence PT Goal: Stand to Sit - Progress: Goal set today Pt will Transfer Bed to Chair/Chair to Bed: with modified independence PT Transfer Goal: Bed to Chair/Chair to Bed - Progress: Goal set today Pt will Ambulate: >150 feet;with supervision;with least restrictive assistive device PT Goal: Ambulate - Progress: Goal set today Pt will Go Up / Down Stairs: 3-5 stairs;with supervision;with rail(s) PT Goal: Up/Down Stairs - Progress: Goal set today Additional Goals Additional Goal #1: Pt will ambulate at <1.8 ft/sec to decrease risk for falls PT Goal: Additional Goal #1 - Progress: Goal set today  Visit Information  Last PT Received On: 12/03/12 Assistance Needed: +1    Subjective Data  Patient Stated Goal: to be back to her self   Prior Functioning  Home Living Lives With: Alone Available Help at Discharge: Family;Available PRN/intermittently (daughter lives next door) Type of Home: House Home Access: Stairs to enter Entrance  Stairs-Number of Steps: 3 Entrance Stairs-Rails: Right Home Layout: One level Bathroom Shower/Tub: Tub/shower unit;Curtain Bathroom Toilet: Handicapped height Home Adaptive Equipment: Grab bars in shower;Straight cane;Walker - rolling;Shower chair with back Prior Function Level of Independence: Independent Able to Take Stairs?: Yes Driving: Yes Vocation: Retired Musician: No difficulties Dominant Hand: Right    Cognition  Overall Cognitive Status: Appears within functional limits for tasks assessed/performed Arousal/Alertness: Awake/alert Orientation Level: Appears intact for tasks assessed Behavior During Session: [facility] for tasks performed    Extremity/Trunk Assessment Right Lower Extremity Assessment RLE ROM/Strength/Tone: Deficits;Unable to fully assess;Due to pain RLE ROM/Strength/Tone Deficits: pain limiting MMT RLE Sensation: WFL - Light Touch Left Lower Extremity Assessment LLE ROM/Strength/Tone: Within functional levels LLE Sensation: WFL - Light Touch   Balance    End of Session PT - End of Session Equipment Utilized During Treatment: Gait belt Activity Tolerance: Patient tolerated treatment well Patient left: in chair;with call bell/phone within reach;with family/visitor present Nurse Communication: Mobility status  GP     [name] 12/03/2012, 11:39 AM  12/03/2012 [name] DPT PAGER: [phone] OFFICE: [phone]

## 2012-12-04 ENCOUNTER — Inpatient Hospital Stay (HOSPITAL_COMMUNITY): Payer: Medicare Other

## 2012-12-04 LAB — BASIC METABOLIC PANEL
CO2: 26 mEq/L (ref 19–32)
Chloride: 96 mEq/L (ref 96–112)
Glucose, Bld: 343 mg/dL — ABNORMAL HIGH (ref 70–99)
Sodium: 135 mEq/L (ref 135–145)

## 2012-12-04 LAB — CBC
Hemoglobin: 10.6 g/dL — ABNORMAL LOW (ref 12.0–15.0)
Platelets: 132 10*3/uL — ABNORMAL LOW (ref 150–400)
RBC: 3.18 MIL/uL — ABNORMAL LOW (ref 3.87–5.11)
WBC: 6 10*3/uL (ref 4.0–10.5)

## 2012-12-04 LAB — GLUCOSE, CAPILLARY
Glucose-Capillary: 233 mg/dL — ABNORMAL HIGH (ref 70–99)
Glucose-Capillary: 206 mg/dL — ABNORMAL HIGH (ref 70–99)

## 2012-12-04 MED ORDER — MUSCLE RUB 10-15 % EX CREA: 1.0000 "application " | TOPICAL_CREAM | Freq: Two times a day (BID) | CUTANEOUS | Status: DC | PRN

## 2012-12-04 MED ORDER — DOUBLE ANTIBIOTIC 500-10000 UNIT/GM EX OINT: 1.0000 "application " | TOPICAL_OINTMENT | Freq: Two times a day (BID) | CUTANEOUS | Status: DC

## 2012-12-04 MED ORDER — ASPIRIN 81 MG PO TABS: 81.0000 mg | ORAL_TABLET | Freq: Every day | ORAL | Status: DC

## 2012-12-04 MED ORDER — SENNOSIDES-DOCUSATE SODIUM 8.6-50 MG PO TABS: 1.0000 | ORAL_TABLET | Freq: Two times a day (BID) | ORAL | Status: AC | PRN

## 2012-12-04 NOTE — Discharge Summary (Signed)
Physician Discharge Summary  Jody Dorsey ZOX:[id number] DOB: Mar 19, 1930 DOA: 11/30/2012  PCP: Dr. Einar Crow, at [facility] Cardiology: Dr. Harold Hedge  Admit date: 11/30/2012 Discharge date: 12/04/2012  Time spent: 40 minutes  Recommendations for Outpatient Follow-up:  Please followup with Dr. Dareen Piano (PCP) in 1 week.  Please resume aspirin 81 mg daily on 12/07/2012. Continue to hold plavix, please discuss with your cardiologist if you need resume plavix. If your cardiologist wants you to resume plavix then please discuss with Dr. Danielle Dess (neurosugery) when to resume plavix. (include homehealth, outpatient follow-up instructions, specific recommendations for PCP to follow-up on, etc.)  Discharge Diagnoses:  Principal Problem:  *Closed head injury with petechial brain hemorrhage Active Problems:  SDH (subdural hematoma)  Diabetes mellitus  Laceration   Discharge Condition: Stable  Diet recommendation: Diabetic diet.  Filed Weights   11/30/12 2011  Weight: 62.7 kg (138 lb 3.7 oz)    History of present illness:  79 y.o PMH HTN, CAD with stents x 2, DM with possible syncopal episode leading to CT proven small tentorial SDH, multiple lacerations requiring sutures transferred from [place] to [facility] for [facility] care. Started on a Nicardipine drip at [place] to control blood pressure initial BP 180s-200s at [place].  Hospital Course:  Closed head injury / SDH + SAH Neurosurgery, Dr. Danielle Dess, evaluated the patient during hospital stay. Patient did not require intervention.  Discussed with Dr. Wynetta Emery neurosurgery about when to resume aspirin and plavix.  He reviewed repeat head CT on 12/04/2012 which showed evolving closed head injury with subarachnoid blood bilaterally and tiny subdural hemorrhage adjacent to the left temporal lobe, blood product appears less dense, no interval increased bleeding or new bleeding identified. Recommended restarting ASA in 7 days  from admission on 12/07/2012. He indicated that patient should contact Dr. Danielle Dess in office to determine when to have plavix resumed as outpatient. In the meanwhile, patient was instructed to followup with her cardiologist to determine if she still needs to be on plavix. Discussed the above plan with patient and daughter who indicated that they understood the above instructions and any questions were answered.  Fall w/ multiple lacerations  Dr. Sharon Seller discussed the circumstances of the patient's accident in great detail with her and her daughter. The daughter believes, based on her investigation, that the patient actually slipped and fell in her icey/snow covered driveway, striking her head and then becoming unconscious. The details that she provides do indeed fit the clinical picture, patient likely did not suffer a syncopal episode, but rather had a mechanical fall.  Carotid Dopplers and echocardiogram are unrevealing. No PT needs identified by PT. Home health was discussed with patient and daughter, they declined, indicating that they have enough help at home.  Malignant HTN  Required cardene gtt initially in setting of ICH.  Blood pressure improved.  Now back on home antihypertensive medications.  Continue to monitor.  Hypokalemia  Resolved with replacement.  Normocytic anemia  Hemoglobin stable continue to monitor.  DM  CBG elevated.  Sliding scale insulin.  On home glyburide and metformin. Patient on liraglutide at home which she has not been getting here, accounting for hyperglycemia. To resume home medications at discharge.  CAD s/p PTCA/Stent x2 2008  The plan at this time is to resume aspirin in 7 days.  Resumption of Plavix discussed above.  Patient should also follow up with her cardiologist to determine if the ongoing use of both of these therapies is indicated given the amount of time  that has passed since her stent placement.  Anemia Due to acute blood loss from recent fall and  injury. Hemoglobin stable.  Code Status: DNR   Consultants:  PCCM  Neurosurgery  Trauma Surgery   Procedures:  Carotid dopplers - 1/23 - unrevealing  TTE - 1/23 - EF 60-65% - grade 1 DD - no signif AoS or other valvular disease   Discharge Exam: Filed Vitals:   12/03/12 1400 12/03/12 1808 12/04/12 0200 12/04/12 0459  BP: 110/50 112/52 150/57 183/68  Pulse: 73 79 84 78  Temp: 97.8 F (36.6 C) 98.2 F (36.8 C) 97.6 F (36.4 C) 97.7 F (36.5 C)  TempSrc: Oral Oral Oral Oral  Resp: 18 18 18 18   Height:      Weight:      SpO2: 98% 99% 98% 98%   Discharge Instructions  Discharge Orders    Future Orders Please Complete By Expires   Diet Carb Modified      Increase activity slowly      Discharge instructions      Comments:   Please followup with Dr. Dareen Piano (PCP) in 1 week.  Please resume aspirin 81 mg daily on 12/07/2012. Continue to hold plavix, please discuss with your cardiologist if you need resume plavix. If your cardiologist wants you to resume plavix then please discuss with Dr. Danielle Dess (neurosugery) when to resume plavix.       Medication List     As of 12/04/2012 11:30 AM    STOP taking these medications         clopidogrel 75 MG tablet   Commonly known as: PLAVIX      TAKE these medications         acetaminophen 325 MG tablet   Commonly known as: TYLENOL   Take 650 mg by mouth every 6 (six) hours as needed.      amLODipine 10 MG tablet   Commonly known as: NORVASC   Take 10 mg by mouth daily.      aspirin 81 MG tablet   Take 1 tablet (81 mg total) by mouth daily. Start on 12/07/2012   Start taking on: 12/07/2012      cloNIDine 0.1 MG tablet   Commonly known as: CATAPRES   Take 0.1 mg by mouth 2 (two) times daily.      glyBURIDE micronized 6 MG tablet   Commonly known as: GLYNASE   Take 6 mg by mouth 2 (two) times daily before a meal.      isosorbide mononitrate 30 MG 24 hr tablet   Commonly known as: IMDUR   Take 30 mg by mouth daily.       latanoprost 0.005 % ophthalmic solution   Commonly known as: XALATAN   1 drop at bedtime.      levothyroxine 50 MCG tablet   Commonly known as: SYNTHROID, LEVOTHROID   Take 50 mcg by mouth daily.      metFORMIN 500 MG tablet   Commonly known as: GLUCOPHAGE   Take 500 mg by mouth 2 (two) times daily with a meal.      metoprolol succinate 100 MG 24 hr tablet   Commonly known as: TOPROL-XL   Take 100 mg by mouth 2 (two) times daily. Take with or immediately following a meal.      Muscle Rub 10-15 % Crea   Apply 1 application topically 2 (two) times daily as needed.      nitroGLYCERIN 0.4 MG SL tablet   Commonly known as:  NITROSTAT   Place 0.4 mg under the tongue every 5 (five) minutes as needed.      polymixin-bacitracin 500-10000 UNIT/GM Oint ointment   Apply 1 application topically 2 (two) times daily.      potassium chloride 8 MEQ tablet   Commonly known as: KLOR-CON   Take 8 mEq by mouth 2 (two) times daily.      rosuvastatin 5 MG tablet   Commonly known as: CRESTOR   Take 5 mg by mouth every Monday, Wednesday, and Friday.      senna-docusate 8.6-50 MG per tablet   Commonly known as: Senokot-S   Take 1 tablet by mouth 2 (two) times daily as needed for constipation.      telmisartan-hydrochlorothiazide 80-25 MG per tablet   Commonly known as: MICARDIS HCT   Take 1 tablet by mouth daily.      traMADol 200 MG 24 hr tablet   Commonly known as: ULTRAM-ER   Take 200 mg by mouth 2 (two) times daily as needed.           Follow-up Information    Follow up with Dr. Dareen Piano (PCP). Schedule an appointment as soon as possible for a visit in 1 week.      Follow up with Harold Hedge A., MD. Schedule an appointment as soon as possible for a visit in 1 week. (Please discuss with your cardiologist if Plavix needs to resumed or stopped.)    Contact information:   [address] [place] [address] [phone]       Follow up with Stefani Dama, MD. (Please call  his office to determine when to resume Plavix.)    Contact information:   [address] [place] [address] [phone]           The results of significant diagnostics from this hospitalization (including imaging, microbiology, ancillary and laboratory) are listed below for reference.    Significant Diagnostic Studies: Ct Head Wo Contrast  12/04/2012  *RADIOLOGY REPORT*  Clinical Data: Closed head injury with subarachnoid hemorrhage and possible small subdural hemorrhage.  CT HEAD WITHOUT CONTRAST  Technique:  Contiguous axial images were obtained from the base of the skull through the vertex without contrast.  Comparison: 12/01/2012  Findings: Areas of contusion injury and focal subarachnoid hemorrhage appear stable with less hyperdense blood product noted compared to the scan 3 days ago.  Subarachnoid blood continues to be centered in the region of the right sylvian fissure and adjacent sulci with contrecoup blood also present in the left parieto- occipital region.  There is suggestion of potentially a minuscule amount of subdural blood adjacent to the left temporal lobe. Evolving scalp hematoma is seen over the right fronto-parietal region.  There is no evidence of skull fracture.  No infarction, hydrocephalus or mass effect is identified.  IMPRESSION: Evolving closed head injury with subarachnoid blood bilaterally and tiny subdural hemorrhage adjacent to the left temporal lobe.  Blood product appears less dense.  No interval increased bleeding or new bleeding identified.   Original Report Authenticated By: Irish Lack, M.D.    Ct Head Wo Contrast  12/01/2012  *RADIOLOGY REPORT*  Clinical Data: Follow up known intracranial hemorrhage.  CT HEAD WITHOUT CONTRAST  Technique:  Contiguous axial images were obtained from the base of the skull through the vertex without contrast.  Comparison: None.  Findings:   Prominent subarachnoid hemorrhage is noted along the right Sylvian  fissure and the right frontal and parietal lobes, and also along  the left occipital lobe.  There is trace subdural blood along the left tentorium cerebelli.  There is suggestion of trace subdural hematoma overlying the left temporal lobe, measuring 2-3 mm in thickness.  Scattered periventricular and subcortical white matter change likely reflects small vessel ischemic microangiopathy.  The posterior fossa, including the cerebellum, brainstem and fourth ventricle, is within normal limits.  The third and lateral ventricles, and basal ganglia are unremarkable in appearance.  No significant mass effect or midline shift is seen.  There is no evidence of fracture; visualized osseous structures are unremarkable in appearance.  The visualized portions of the orbits are within normal limits.  The paranasal sinuses and mastoid air cells are well-aerated.  A small scalp hematoma is noted overlying the right frontal calvarium; there is diffuse soft tissue swelling along the right side of the head.  IMPRESSION:  1.  Prominent acute subarachnoid hemorrhage along the right Sylvian fissure, and the right frontal and parietal lobes, and also along the left occipital lobe. 2.  Trace acute subdural blood along the left tentorium cerebelli. 3.  Suggestion of trace subdural hematoma overlying the left temporal lobe, measuring 2-3 mm in thickness. 4.  Scattered small vessel ischemic microangiopathy. 5.  Small scalp hematoma overlying the right frontal calvarium; diffuse soft tissue swelling along the right side of the head.  These results were called by telephone on 12/01/2012 at 05:32 a.m. to [facility] RN on [id number], who verbally acknowledged these results.   Original Report Authenticated By: Tonia Ghent, M.D.    Dg Femur Right Port  12/01/2012  *RADIOLOGY REPORT*  Clinical Data: Status post fall; pain at the right thigh.  PORTABLE RIGHT FEMUR - 2 VIEW  Comparison: None.  Findings: There is no evidence of fracture or dislocation.  The  right femur appears intact.  The right femoral head remains seated at the acetabulum, with minimal associated osteophyte formation. The knee joint is grossly unremarkable in appearance; no significant knee joint effusion is identified.  An enthesophyte is seen arising at the superior pole of the patella.  Mild scattered vascular calcifications are seen.  IMPRESSION:  1.  No evidence of fracture or dislocation. 2.  Mild scattered vascular calcifications seen.   Original Report Authenticated By: Tonia Ghent, M.D.    Dg Hand Complete Right  12/01/2012  *RADIOLOGY REPORT*  Clinical Data: Status post fall; pain and multiple abrasions at the right hand and fingers.  RIGHT HAND - COMPLETE 3+ VIEW  Comparison: None.  Findings: There is no evidence of fracture or dislocation.  Mild degenerative change is noted at the first carpometacarpal joint. Mild degenerative change is also noted at the third and fourth distal interphalangeal joints.  The joint spaces are otherwise preserved; known soft tissue abnormalities are not well characterized on radiograph.  The carpal rows are intact, and demonstrate normal alignment.  IMPRESSION:  1.  No evidence of fracture or dislocation. 2.  Mild osteoarthritis noted.   Original Report Authenticated By: Tonia Ghent, M.D.     Microbiology: Recent Results (from the past 240 hour(s))  MRSA PCR SCREENING     Status: Normal   Collection Time   11/30/12  8:26 PM      Component Value Range Status Comment   MRSA by PCR NEGATIVE  NEGATIVE Final      Labs: Basic Metabolic Panel:  Lab 12/04/12 9604 12/03/12 0605 12/01/12 0432 11/30/12 2302  NA 135 139 137 136  K 3.5 4.5 3.5 3.3*  CL 96 101 99 97  CO2 26 30 28 25   GLUCOSE 343* 255* 155* 213*  BUN 25* 21 22 23   CREATININE 0.78 0.85 0.90 0.78  CALCIUM 9.5 9.6 9.4 9.3  MG -- -- 2.6* 1.7  PHOS -- -- 3.8 3.8   Liver Function Tests:  Lab 11/30/12 2302  AST 20  ALT 13  ALKPHOS 45  BILITOT 0.5  PROT 6.6  ALBUMIN 3.7    No results found for this basename: LIPASE:5,AMYLASE:5 in the last 168 hours No results found for this basename: AMMONIA:5 in the last 168 hours CBC:  Lab 12/04/12 0804 12/03/12 0605 11/30/12 2302  WBC 6.0 5.6 9.5  NEUTROABS -- -- 7.8*  HGB 10.6* 10.3* 11.6*  HCT 30.8* 31.0* 34.0*  MCV 96.9 96.6 95.8  PLT 132* 126* 147*   Cardiac Enzymes:  Lab 12/01/12 0942 12/01/12 0432 11/30/12 2255  CKTOTAL -- -- --  CKMB -- -- --  CKMBINDEX -- -- --  TROPONINI <0.30 <0.30 <0.30   BNP: BNP (last 3 results)  Basename 11/30/12 2255  PROBNP 908.4*   CBG:  Lab 12/04/12 0641 12/03/12 2247 12/03/12 1615 12/03/12 1140 12/03/12 0759  GLUCAP 233* 151* 187* 166* 318*       Signed:  Jaylon Boylen A  [facility] 12/04/2012, 11:30 AM

## 2012-12-04 NOTE — Progress Notes (Signed)
Physical Therapy Treatment Patient Details Name: Jody Dorsey MRN: [id number] DOB: [birth year] Today's Date: 12/04/2012 Time: 4098-1191 PT Time Calculation (min): 16 min  PT Assessment / Plan / Recommendation Comments on Treatment Session  Pt progressing well. Feel as though slow gait is sercondary to nerves and soreness from fall. No PT needs upon d/c as daughter will be available 24/7    Follow Up Recommendations  No PT follow up;Supervision/Assistance - 24 hour     Does the patient have the potential to tolerate intense rehabilitation     Barriers to Discharge        Equipment Recommendations  Rolling walker with 5" wheels    Recommendations for Other Services    Frequency Min 3X/week   Plan Discharge plan remains appropriate;Frequency remains appropriate    Precautions / Restrictions Precautions Precautions: Fall Restrictions Weight Bearing Restrictions: No   Pertinent Vitals/Pain No complaints of pain    Mobility  Bed Mobility Bed Mobility: Supine to Sit;Sitting - Scoot to Edge of Bed Supine to Sit: 6: Modified independent (Device/Increase time) Sitting - Scoot to Edge of Bed: 6: Modified independent (Device/Increase time) Transfers Transfers: Sit to Stand;Stand to Sit Sit to Stand: 4: Min guard;With upper extremity assist;From bed Stand to Sit: 4: Min guard;With upper extremity assist;To bed Details for Transfer Assistance: Minguard asisst for safety. Cues for safe technique Ambulation/Gait Ambulation/Gait Assistance: 5: Supervision;4: Min guard Ambulation Distance (Feet): 150 Feet Assistive device: Rolling walker;None Ambulation/Gait Assistance Details: Supervision only with RW, minguard assist without AD. Pt with slow guarded gait pattern. see balance section Gait Pattern: Decreased stance time - right;Decreased step length - left;Decreased hip/knee flexion - right;Step-to pattern;Decreased weight shift to right Gait velocity: slow Stairs: No    Exercises      PT Diagnosis:    PT Problem List:   PT Treatment Interventions:     PT Goals Acute Rehab PT Goals PT Goal: Sit to Stand - Progress: Progressing toward goal PT Goal: Stand to Sit - Progress: Progressing toward goal PT Transfer Goal: Bed to Chair/Chair to Bed - Progress: Progressing toward goal PT Goal: Ambulate - Progress: Progressing toward goal Additional Goals PT Goal: Additional Goal #1 - Progress: Progressing toward goal  Visit Information  Last PT Received On: 12/04/12 Assistance Needed: +1    Subjective Data      Cognition  Overall Cognitive Status: Appears within functional limits for tasks assessed/performed Arousal/Alertness: Awake/alert Orientation Level: Appears intact for tasks assessed Behavior During Session: [facility] for tasks performed    Balance  Standardized Balance Assessment Standardized Balance Assessment: Dynamic Gait Index Dynamic Gait Index Level Surface: Mild Impairment Change in Gait Speed: Normal Gait with Horizontal Head Turns: Mild Impairment Gait with Vertical Head Turns: Mild Impairment Gait and Pivot Turn: Normal Step Over Obstacle: Mild Impairment Step Around Obstacles: Normal  End of Session PT - End of Session Equipment Utilized During Treatment: Gait belt Activity Tolerance: Patient tolerated treatment well Patient left: in bed;with call bell/phone within reach;with family/visitor present Nurse Communication: Mobility status   GP     [name] 12/04/2012, 10:55 AM

## 2012-12-04 NOTE — Progress Notes (Signed)
[facility] PROGRESS NOTE  Brandis Wixted QQV:[id number] DOB: 21-May-1930 DOA: 11/30/2012 PCP: Provider Not In System Brief narrative:  79 y.o PMH HTN, CAD with stents x 2, DM with possible syncopal episode leading to CT proven small tentorial SDH, multiple lacerations requiring sutures transferred from [place] to [facility] for further care. Started on a Nicardipine drip at [place] to control blood pressure initial BP 180s-200s at [place].   Assessment/Plan: Closed head injury / SDH + [facility] Neurosurgery, Dr. Danielle Dess has signed off. No intervention needed.  Discussed with Dr. Wynetta Emery neurosurgery about when to resume aspirin and plavix yesterday.  He reviewed head CT today showed evolving closed head injury with subarachnoid blood bilaterally and tiny subdural hemorrhage adjacent to the left temporal lobe, blood product appears less dense, no interval increased bleeding or new bleeding identified. Recommended restarting ASA in 7 days from admission (12/07/2012). He indicated that family should contact Dr. Danielle Dess in office to determine when to have plavix resumed. In the meanwhile, patient was instructed to followup with her cardiologist to determine if she still needs to be on plavix. Discussed the above plan with patient and daughter who indicated that they understood the above instructions and any questions were answered.  Fall w/ multiple lacerations  Dr. Sharon Seller discussed the circumstances of the patient's accident in great detail with her and her daughter. The daughter believes, based on her investigation, that the patient actually slipped and fell in her icey/snow covered driveway, striking her head and then becoming unconscious. The details that she provides do indeed fit the clinical picture, patient likely did not suffer a syncopal episode, but rather had a mechanical fall.  Carotid Dopplers and echocardiogram are unrevealing. No PT needs identified by PT. Home health was discussed with patient  and daughter, they declined, indicating that they have enough help at home.  Malignant HTN  Required cardene gtt initially in setting of ICH.  Blood pressure improved.  Now back on home antihypertensive medications.  Continue to monitor.  Hypokalemia  Resolved with replacement.  Normocytic anemia  Hemoglobin stable continue to monitor.  DM  CBG elevated.  Sliding scale insulin.  On home glyburide and metformin. Patient on liraglutide at home which she has not been getting here.  CAD s/p PTCA/Stent x2 2008  The plan at this time is to resume aspirin in 7 days.  Resumption of Plavix discussed above.  Patient should also follow up with her cardiologist to determine if the ongoing use of both of these therapies as indicated given the amount of time that has passed since her stent placement   Anemia Due to acute blood loss from recent fall and injury. Hemoglobin stable.  Code Status: DNR Family Communication: Daughter at bedside.. Disposition Plan: DC home today.   Consultants:  PCCM  Neurosurgery  Trauma Surgery   Procedures:  Carotid dopplers - 1/23 - unrevealing  TTE - 1/23 - EF 60-65% - grade 1 DD - no signif AoS or other valvular disease   Antibiotics:  none   DVT prophylaxis:  SCDs   HPI/Subjective: Feeling well. Wondering if she can go home today.  Objective: Filed Vitals:   12/03/12 1400 12/03/12 1808 12/04/12 0200 12/04/12 0459  BP: 110/50 112/52 150/57 183/68  Pulse: 73 79 84 78  Temp: 97.8 F (36.6 C) 98.2 F (36.8 C) 97.6 F (36.4 C) 97.7 F (36.5 C)  TempSrc: Oral Oral Oral Oral  Resp: 18 18 18 18   Height:      Weight:  SpO2: 98% 99% 98% 98%    Intake/Output Summary (Last 24 hours) at 12/04/12 1113 Last data filed at 12/03/12 1700  Gross per 24 hour  Intake    760 ml  Output      2 ml  Net    758 ml   Filed Weights   11/30/12 2011  Weight: 62.7 kg (138 lb 3.7 oz)    Exam: Physical Exam: General: Awake, Oriented, No acute  distress. HEENT: EOMI. Ecchymosis over the right lateral forehead with hematoma. Neck: Supple CV: S1 and S2 Lungs: Clear to ascultation bilaterally Abdomen: Soft, Nontender, Nondistended, +bowel sounds. Ext: Good pulses. Trace edema. Skin: Multiple bruising on hands bilaterally.  Data Reviewed: Basic Metabolic Panel:  Lab 12/04/12 1610 12/03/12 0605 12/01/12 0432 11/30/12 2302  NA 135 139 137 136  K 3.5 4.5 3.5 3.3*  CL 96 101 99 97  CO2 26 30 28 25   GLUCOSE 343* 255* 155* 213*  BUN 25* 21 22 23   CREATININE 0.78 0.85 0.90 0.78  CALCIUM 9.5 9.6 9.4 9.3  MG -- -- 2.6* 1.7  PHOS -- -- 3.8 3.8   Liver Function Tests:  Lab 11/30/12 2302  AST 20  ALT 13  ALKPHOS 45  BILITOT 0.5  PROT 6.6  ALBUMIN 3.7   No results found for this basename: LIPASE:5,AMYLASE:5 in the last 168 hours No results found for this basename: AMMONIA:5 in the last 168 hours CBC:  Lab 12/04/12 0804 12/03/12 0605 11/30/12 2302  WBC 6.0 5.6 9.5  NEUTROABS -- -- 7.8*  HGB 10.6* 10.3* 11.6*  HCT 30.8* 31.0* 34.0*  MCV 96.9 96.6 95.8  PLT 132* 126* 147*   Cardiac Enzymes:  Lab 12/01/12 0942 12/01/12 0432 11/30/12 2255  CKTOTAL -- -- --  CKMB -- -- --  CKMBINDEX -- -- --  TROPONINI <0.30 <0.30 <0.30   BNP (last 3 results)  Basename 11/30/12 2255  PROBNP 908.4*   CBG:  Lab 12/04/12 0641 12/03/12 2247 12/03/12 1615 12/03/12 1140 12/03/12 0759  GLUCAP 233* 151* 187* 166* 318*    Recent Results (from the past 240 hour(s))  MRSA PCR SCREENING     Status: Normal   Collection Time   11/30/12  8:26 PM      Component Value Range Status Comment   MRSA by PCR NEGATIVE  NEGATIVE Final      Studies: Ct Head Wo Contrast  12/04/2012  *RADIOLOGY REPORT*  Clinical Data: Closed head injury with subarachnoid hemorrhage and possible small subdural hemorrhage.  CT HEAD WITHOUT CONTRAST  Technique:  Contiguous axial images were obtained from the base of the skull through the vertex without contrast.   Comparison: 12/01/2012  Findings: Areas of contusion injury and focal subarachnoid hemorrhage appear stable with less hyperdense blood product noted compared to the scan 3 days ago.  Subarachnoid blood continues to be centered in the region of the right sylvian fissure and adjacent sulci with contrecoup blood also present in the left parieto- occipital region.  There is suggestion of potentially a minuscule amount of subdural blood adjacent to the left temporal lobe. Evolving scalp hematoma is seen over the right fronto-parietal region.  There is no evidence of skull fracture.  No infarction, hydrocephalus or mass effect is identified.  IMPRESSION: Evolving closed head injury with subarachnoid blood bilaterally and tiny subdural hemorrhage adjacent to the left temporal lobe.  Blood product appears less dense.  No interval increased bleeding or new bleeding identified.   Original Report Authenticated By: Irish Lack,  M.D.     Scheduled Meds:    . amLODipine  10 mg Oral Daily  . atorvastatin  10 mg Oral q1800  . cloNIDine  0.1 mg Oral BID  . glyBURIDE micronized  6 mg Oral BID AC  . hydrochlorothiazide  25 mg Oral Daily  . insulin aspart  0-15 Units Subcutaneous TID WC  . insulin aspart  0-5 Units Subcutaneous QHS  . irbesartan  300 mg Oral Daily  . isosorbide mononitrate  30 mg Oral Daily  . levothyroxine  50 mcg Oral QAC breakfast  . metFORMIN  500 mg Oral BID WC  . metoprolol succinate  100 mg Oral BID  . polymixin-bacitracin   Topical BID  . senna-docusate  1 tablet Oral BID   Continuous Infusions:   Principal Problem:  *Closed head injury with petechial brain hemorrhage Active Problems:  SDH (subdural hematoma)  Diabetes mellitus  Laceration    Letrell Attwood A, MD  [facility] Pager [phone]. If 7PM-7AM, please contact night-coverage at [email or web address], password [facility] 12/04/2012, 11:13 AM  LOS: 4 days

## 2013-04-25 ENCOUNTER — Ambulatory Visit: Payer: Self-pay | Admitting: Ophthalmology

## 2013-04-25 LAB — POTASSIUM: Potassium: 3.8 mmol/L (ref 3.5–5.1)

## 2013-05-09 ENCOUNTER — Ambulatory Visit: Payer: Self-pay | Admitting: Ophthalmology

## 2013-06-06 ENCOUNTER — Other Ambulatory Visit: Payer: Self-pay | Admitting: Cardiovascular Disease

## 2013-06-06 NOTE — Telephone Encounter (Signed)
Rx was sent to pharmacy electronically. 

## 2013-08-02 ENCOUNTER — Ambulatory Visit: Payer: Self-pay | Admitting: Unknown Physician Specialty

## 2013-08-20 ENCOUNTER — Other Ambulatory Visit: Payer: Self-pay | Admitting: Cardiovascular Disease

## 2015-02-26 NOTE — Op Note (Signed)
PATIENT NAME:  Jody Dorsey, LENGACHER MR#:  [id number] DATE OF BIRTH:  [birth year]  DATE OF PROCEDURE:  10/25/2012  PREOPERATIVE DIAGNOSIS: Visually significant cataract of the left eye.   POSTOPERATIVE DIAGNOSIS: Visually significant cataract of the left eye.   OPERATIVE PROCEDURE: Cataract extraction by phacoemulsification with implant of intraocular lens to left eye.   SURGEON: Birder Robson, MD.   ANESTHESIA:  1. Managed anesthesia care.  2. Topical tetracaine drops followed by 2% Xylocaine jelly applied in the preoperative holding area.   COMPLICATIONS: None.   TECHNIQUE:  Stop and chop.  DESCRIPTION OF PROCEDURE: The patient was examined and consented in the preoperative holding area where the aforementioned topical anesthesia was applied to the left eye and then brought back to the Operating Room where the left eye was prepped and draped in the usual sterile ophthalmic fashion and a lid speculum was placed. A paracentesis was created with the side port blade and the anterior chamber was filled with viscoelastic. A near clear corneal incision was performed with the steel keratome. A continuous curvilinear capsulorrhexis was performed with a cystotome followed by the capsulorrhexis forceps. Hydrodissection and hydrodelineation were carried out with BSS on a blunt cannula. The lens was removed in a stop and chop technique and the remaining cortical material was removed with the irrigation-aspiration handpiece. The capsular bag was inflated with viscoelastic and the Tecnis ZCB00 28.0-diopter lens, serial number [id number] was placed in the capsular bag without complication. The remaining viscoelastic was removed from the eye with the irrigation-aspiration handpiece. The wounds were hydrated. The anterior chamber was flushed with Miostat and the eye was inflated to physiologic pressure. The wounds were found to be water tight. The eye was dressed with Vigamox. The patient was given protective glasses  to wear throughout the day and a shield with which to sleep tonight. The patient was also given drops with which to begin a drop regimen today and will follow-up with me in one day.   ____________________________ Livingston Diones. Veda Arrellano, MD wlp:sb D: 10/25/2012 22:00:29 ET T: 10/26/2012 10:18:33 ET JOB#: [id number]  cc: Milan Perkins L. Kush Farabee, MD, <Dictator> Livingston Diones Musette Kisamore MD ELECTRONICALLY SIGNED 10/28/2012 10:51

## 2015-03-01 NOTE — Op Note (Signed)
PATIENT NAME:  Jody Dorsey, Jody Dorsey MR#:  [id number] DATE OF BIRTH:  [birth year]  DATE OF PROCEDURE:  05/09/2013  PREOPERATIVE DIAGNOSIS: Visually significant cataract of the right eye.   POSTOPERATIVE DIAGNOSIS: Visually significant cataract of the right eye.   OPERATIVE PROCEDURE: Cataract extraction by phacoemulsification with implant of intraocular lens to right eye.   SURGEON: Birder Robson, MD.   ANESTHESIA:  1. Managed anesthesia care.  2. Topical tetracaine drops followed by 2% Xylocaine jelly applied in the preoperative holding area.   COMPLICATIONS: None.   TECHNIQUE:  Stop and chop.    DESCRIPTION OF PROCEDURE: The patient was examined and consented in the preoperative holding area where the aforementioned topical anesthesia was applied to the right eye and then brought back to the Operating Room where the right eye was prepped and draped in the usual sterile ophthalmic fashion and a lid speculum was placed. A paracentesis was created with the side port blade and the anterior chamber was filled with viscoelastic. A near clear corneal incision was performed with the steel keratome. A continuous curvilinear capsulorrhexis was performed with a cystotome followed by the capsulorrhexis forceps. Hydrodissection and hydrodelineation were carried out with BSS on a blunt cannula. The lens was removed in a stop and chop technique and the remaining cortical material was removed with the irrigation-aspiration handpiece. The capsular bag was inflated with viscoelastic and the Tecnis ZCB00 28.5-diopter lens, serial number [id number] was placed in the capsular bag without complication. The remaining viscoelastic was removed from the eye with the irrigation-aspiration handpiece. The wounds were hydrated. The anterior chamber was flushed with Miostat and the eye was inflated to physiologic pressure. 0.1 mL of cefuroxime concentration 10 mg/mL was placed in the anterior chamber. The wounds were found to be  water tight. The eye was dressed with Vigamox. The patient was given protective glasses to wear throughout the day and a shield with which to sleep tonight. The patient was also given drops with which to begin a drop regimen today and will follow-up with me in one day.   ____________________________ Livingston Diones. Salinda Snedeker, MD wlp:gb D: 05/09/2013 17:26:05 ET T: 05/09/2013 21:12:01 ET JOB#: [id number]  cc: Noemi Ishmael L. Syliva Mee, MD, <Dictator> Livingston Diones Travas Schexnayder MD ELECTRONICALLY SIGNED 05/10/2013 15:58

## 2016-12-10 ENCOUNTER — Encounter: Payer: Self-pay | Admitting: Emergency Medicine

## 2016-12-10 ENCOUNTER — Emergency Department: Payer: Medicare Other

## 2016-12-10 ENCOUNTER — Inpatient Hospital Stay
Admission: EM | Admit: 2016-12-10 | Discharge: 2016-12-14 | DRG: 638 | Disposition: A | Discharge status: Skilled Nursing Facility | Payer: Medicare Other | Attending: Internal Medicine | Admitting: Internal Medicine

## 2016-12-10 DIAGNOSIS — E876 Hypokalemia: Secondary | ICD-10-CM

## 2016-12-10 DIAGNOSIS — Z7982 Long term (current) use of aspirin: Secondary | ICD-10-CM | POA: Diagnosis not present

## 2016-12-10 DIAGNOSIS — I959 Hypotension, unspecified: Secondary | ICD-10-CM | POA: Diagnosis present

## 2016-12-10 DIAGNOSIS — I10 Essential (primary) hypertension: Secondary | ICD-10-CM | POA: Diagnosis present

## 2016-12-10 DIAGNOSIS — R748 Abnormal levels of other serum enzymes: Secondary | ICD-10-CM | POA: Diagnosis present

## 2016-12-10 DIAGNOSIS — R531 Weakness: Secondary | ICD-10-CM

## 2016-12-10 DIAGNOSIS — D696 Thrombocytopenia, unspecified: Secondary | ICD-10-CM | POA: Diagnosis present

## 2016-12-10 DIAGNOSIS — D72829 Elevated white blood cell count, unspecified: Secondary | ICD-10-CM | POA: Diagnosis present

## 2016-12-10 DIAGNOSIS — E1110 Type 2 diabetes mellitus with ketoacidosis without coma: Principal | ICD-10-CM | POA: Diagnosis present

## 2016-12-10 DIAGNOSIS — F0390 Unspecified dementia without behavioral disturbance: Secondary | ICD-10-CM | POA: Diagnosis present

## 2016-12-10 DIAGNOSIS — Y92019 Unspecified place in single-family (private) house as the place of occurrence of the external cause: Secondary | ICD-10-CM | POA: Diagnosis not present

## 2016-12-10 DIAGNOSIS — L8990 Pressure ulcer of unspecified site, unspecified stage: Secondary | ICD-10-CM | POA: Diagnosis present

## 2016-12-10 DIAGNOSIS — E872 Acidosis: Secondary | ICD-10-CM | POA: Diagnosis present

## 2016-12-10 DIAGNOSIS — Z7984 Long term (current) use of oral hypoglycemic drugs: Secondary | ICD-10-CM | POA: Diagnosis not present

## 2016-12-10 DIAGNOSIS — N289 Disorder of kidney and ureter, unspecified: Secondary | ICD-10-CM | POA: Diagnosis present

## 2016-12-10 DIAGNOSIS — Z955 Presence of coronary angioplasty implant and graft: Secondary | ICD-10-CM | POA: Diagnosis not present

## 2016-12-10 DIAGNOSIS — I2510 Atherosclerotic heart disease of native coronary artery without angina pectoris: Secondary | ICD-10-CM | POA: Diagnosis present

## 2016-12-10 DIAGNOSIS — R7989 Other specified abnormal findings of blood chemistry: Secondary | ICD-10-CM

## 2016-12-10 DIAGNOSIS — R739 Hyperglycemia, unspecified: Secondary | ICD-10-CM

## 2016-12-10 DIAGNOSIS — G912 (Idiopathic) normal pressure hydrocephalus: Secondary | ICD-10-CM | POA: Diagnosis present

## 2016-12-10 DIAGNOSIS — R4182 Altered mental status, unspecified: Secondary | ICD-10-CM | POA: Diagnosis present

## 2016-12-10 DIAGNOSIS — W19XXXA Unspecified fall, initial encounter: Secondary | ICD-10-CM

## 2016-12-10 DIAGNOSIS — Z66 Do not resuscitate: Secondary | ICD-10-CM | POA: Diagnosis present

## 2016-12-10 DIAGNOSIS — R778 Other specified abnormalities of plasma proteins: Secondary | ICD-10-CM

## 2016-12-10 DIAGNOSIS — W1830XA Fall on same level, unspecified, initial encounter: Secondary | ICD-10-CM | POA: Diagnosis present

## 2016-12-10 DIAGNOSIS — G9389 Other specified disorders of brain: Secondary | ICD-10-CM | POA: Diagnosis present

## 2016-12-10 DIAGNOSIS — H409 Unspecified glaucoma: Secondary | ICD-10-CM | POA: Diagnosis present

## 2016-12-10 DIAGNOSIS — E860 Dehydration: Secondary | ICD-10-CM | POA: Diagnosis present

## 2016-12-10 DIAGNOSIS — E871 Hypo-osmolality and hyponatremia: Secondary | ICD-10-CM | POA: Diagnosis present

## 2016-12-10 DIAGNOSIS — R262 Difficulty in walking, not elsewhere classified: Secondary | ICD-10-CM

## 2016-12-10 DIAGNOSIS — Z79899 Other long term (current) drug therapy: Secondary | ICD-10-CM

## 2016-12-10 DIAGNOSIS — M6281 Muscle weakness (generalized): Secondary | ICD-10-CM

## 2016-12-10 LAB — COMPREHENSIVE METABOLIC PANEL
ALBUMIN: 4.4 g/dL (ref 3.5–5.0)
ALT: 25 U/L (ref 14–54)
AST: 40 U/L (ref 15–41)
Alkaline Phosphatase: 61 U/L (ref 38–126)
Anion gap: 23 — ABNORMAL HIGH (ref 5–15)
BUN: 27 mg/dL — AB (ref 6–20)
CHLORIDE: 98 mmol/L — AB (ref 101–111)
CO2: 18 mmol/L — ABNORMAL LOW (ref 22–32)
CREATININE: 0.98 mg/dL (ref 0.44–1.00)
Calcium: 9.3 mg/dL (ref 8.9–10.3)
GFR calc Af Amer: 60 mL/min — ABNORMAL LOW (ref >60)
GFR, EST NON AFRICAN AMERICAN: 52 mL/min — AB (ref >60)
GLUCOSE: 428 mg/dL — AB (ref 65–99)
POTASSIUM: 3 mmol/L — AB (ref 3.5–5.1)
SODIUM: 139 mmol/L (ref 135–145)
Total Bilirubin: 2.2 mg/dL — ABNORMAL HIGH (ref 0.3–1.2)
Total Protein: 7.3 g/dL (ref 6.5–8.1)

## 2016-12-10 LAB — GLUCOSE, CAPILLARY
GLUCOSE-CAPILLARY: 413 mg/dL — AB (ref 65–99)
GLUCOSE-CAPILLARY: 334 mg/dL — AB (ref 65–99)
GLUCOSE-CAPILLARY: 378 mg/dL — AB (ref 65–99)
GLUCOSE-CAPILLARY: 380 mg/dL — AB (ref 65–99)
GLUCOSE-CAPILLARY: 262 mg/dL — AB (ref 65–99)
GLUCOSE-CAPILLARY: 190 mg/dL — AB (ref 65–99)
GLUCOSE-CAPILLARY: 219 mg/dL — AB (ref 65–99)
Glucose-Capillary: 232 mg/dL — ABNORMAL HIGH (ref 65–99)
Glucose-Capillary: 175 mg/dL — ABNORMAL HIGH (ref 65–99)
Glucose-Capillary: 155 mg/dL — ABNORMAL HIGH (ref 65–99)
Glucose-Capillary: 177 mg/dL — ABNORMAL HIGH (ref 65–99)
Glucose-Capillary: 125 mg/dL — ABNORMAL HIGH (ref 65–99)
Glucose-Capillary: 95 mg/dL (ref 65–99)
Glucose-Capillary: 96 mg/dL (ref 65–99)
Glucose-Capillary: 120 mg/dL — ABNORMAL HIGH (ref 65–99)

## 2016-12-10 LAB — URINALYSIS, COMPLETE (UACMP) WITH MICROSCOPIC
BILIRUBIN URINE: NEGATIVE
Glucose, UA: >=500 mg/dL — AB
KETONES UR: 80 mg/dL — AB
Leukocytes, UA: NEGATIVE
Nitrite: NEGATIVE
PH: 5 (ref 5.0–8.0)
Protein, ur: >=300 mg/dL — AB
SPECIFIC GRAVITY, URINE: 1.021 (ref 1.005–1.030)

## 2016-12-10 LAB — CBC
HEMATOCRIT: 45.5 % (ref 35.0–47.0)
Hemoglobin: 15.2 g/dL (ref 12.0–16.0)
MCH: 31.1 pg (ref 26.0–34.0)
MCHC: 33.4 g/dL (ref 32.0–36.0)
MCV: 93.2 fL (ref 80.0–100.0)
PLATELETS: 120 10*3/uL — AB (ref 150–440)
RBC: 4.88 MIL/uL (ref 3.80–5.20)
RDW: 15.2 % — AB (ref 11.5–14.5)
WBC: 11.9 10*3/uL — ABNORMAL HIGH (ref 3.6–11.0)

## 2016-12-10 LAB — TROPONIN I: TROPONIN I: 0.04 ng/mL — AB (ref <0.03)

## 2016-12-10 LAB — BASIC METABOLIC PANEL
ANION GAP: 12 (ref 5–15)
ANION GAP: 8 (ref 5–15)
BUN: 23 mg/dL — AB (ref 6–20)
BUN: 25 mg/dL — ABNORMAL HIGH (ref 6–20)
CALCIUM: 8.8 mg/dL — AB (ref 8.9–10.3)
CHLORIDE: 105 mmol/L (ref 101–111)
CO2: 24 mmol/L (ref 22–32)
CO2: 25 mmol/L (ref 22–32)
Calcium: 8.8 mg/dL — ABNORMAL LOW (ref 8.9–10.3)
Chloride: 101 mmol/L (ref 101–111)
Creatinine, Ser: 0.79 mg/dL (ref 0.44–1.00)
Creatinine, Ser: 0.84 mg/dL (ref 0.44–1.00)
GFR calc Af Amer: >60 mL/min (ref >60)
GFR calc non Af Amer: >60 mL/min (ref >60)
GFR calc non Af Amer: >60 mL/min (ref >60)
GLUCOSE: 166 mg/dL — AB (ref 65–99)
GLUCOSE: 101 mg/dL — AB (ref 65–99)
POTASSIUM: 2.9 mmol/L — AB (ref 3.5–5.1)
POTASSIUM: 3.2 mmol/L — AB (ref 3.5–5.1)
Sodium: 137 mmol/L (ref 135–145)
Sodium: 138 mmol/L (ref 135–145)

## 2016-12-10 LAB — BLOOD GAS, VENOUS
Acid-base deficit: 10.3 mmol/L — ABNORMAL HIGH (ref 0.0–2.0)
BICARBONATE: 18.6 mmol/L — AB (ref 20.0–28.0)
PCO2 VEN: 51 mmHg (ref 44.0–60.0)
Patient temperature: 37
pH, Ven: 7.17 — CL (ref 7.250–7.430)

## 2016-12-10 LAB — BETA-HYDROXYBUTYRIC ACID: Beta-Hydroxybutyric Acid: >8 mmol/L — ABNORMAL HIGH (ref 0.05–0.27)

## 2016-12-10 LAB — CK: Total CK: 864 U/L — ABNORMAL HIGH (ref 38–234)

## 2016-12-10 MED ORDER — DILTIAZEM HCL 25 MG/5ML IV SOLN
10.0000 mg | Freq: Once | INTRAVENOUS | Status: AC
  Administered 2016-12-10: 10 mg via INTRAVENOUS
  Filled 2016-12-10: qty 5

## 2016-12-10 MED ORDER — HYDRALAZINE HCL 20 MG/ML IJ SOLN
10.0000 mg | Freq: Four times a day (QID) | INTRAMUSCULAR | Status: DC | PRN
  Administered 2016-12-10 – 2016-12-11 (×2): 10 mg via INTRAVENOUS
  Filled 2016-12-10 (×2): qty 1

## 2016-12-10 MED ORDER — DEXTROSE-NACL 5-0.45 % IV SOLN
INTRAVENOUS | Status: DC
  Administered 2016-12-10: 20:00:00 via INTRAVENOUS

## 2016-12-10 MED ORDER — POTASSIUM CHLORIDE 2 MEQ/ML IV SOLN
30.0000 meq | Freq: Once | INTRAVENOUS | Status: AC
  Administered 2016-12-10: 30 meq via INTRAVENOUS
  Filled 2016-12-10: qty 15

## 2016-12-10 MED ORDER — TRAMADOL HCL ER 200 MG PO TB24
200.0000 mg | ORAL_TABLET | Freq: Two times a day (BID) | ORAL | Status: DC | PRN
Start: 2016-12-10 — End: 2016-12-10

## 2016-12-10 MED ORDER — CLONIDINE HCL 0.1 MG PO TABS
0.1000 mg | ORAL_TABLET | Freq: Two times a day (BID) | ORAL | Status: DC
  Administered 2016-12-11 – 2016-12-14 (×6): 0.1 mg via ORAL
  Filled 2016-12-10 (×8): qty 1

## 2016-12-10 MED ORDER — ROSUVASTATIN CALCIUM 10 MG PO TABS
5.0000 mg | ORAL_TABLET | ORAL | Status: DC
  Administered 2016-12-11 – 2016-12-14 (×2): 5 mg via ORAL
  Filled 2016-12-10 (×2): qty 1

## 2016-12-10 MED ORDER — ASPIRIN EC 81 MG PO TBEC
81.0000 mg | DELAYED_RELEASE_TABLET | Freq: Every day | ORAL | Status: DC
  Administered 2016-12-11 – 2016-12-14 (×4): 81 mg via ORAL
  Filled 2016-12-10 (×4): qty 1

## 2016-12-10 MED ORDER — SODIUM CHLORIDE 0.9 % IV SOLN
INTRAVENOUS | Status: DC
  Administered 2016-12-10: 6.4 [IU]/h via INTRAVENOUS
  Administered 2016-12-10: 4.7 [IU]/h via INTRAVENOUS
  Filled 2016-12-10: qty 2.5

## 2016-12-10 MED ORDER — SODIUM CHLORIDE 0.9 % IV SOLN
INTRAVENOUS | Status: DC
  Administered 2016-12-10: 2.7 [IU]/h via INTRAVENOUS
  Filled 2016-12-10: qty 2.5

## 2016-12-10 MED ORDER — METFORMIN HCL 500 MG PO TABS
500.0000 mg | ORAL_TABLET | Freq: Two times a day (BID) | ORAL | Status: DC
Start: 2016-12-10 — End: 2016-12-14
  Administered 2016-12-11 – 2016-12-14 (×8): 500 mg via ORAL
  Filled 2016-12-10 (×8): qty 1

## 2016-12-10 MED ORDER — METOPROLOL SUCCINATE ER 100 MG PO TB24
100.0000 mg | ORAL_TABLET | Freq: Two times a day (BID) | ORAL | Status: DC
  Administered 2016-12-11 – 2016-12-14 (×6): 100 mg via ORAL
  Filled 2016-12-10 (×6): qty 1
  Filled 2016-12-10 (×2): qty 2

## 2016-12-10 MED ORDER — POTASSIUM CHLORIDE 20 MEQ PO PACK
40.0000 meq | PACK | Freq: Once | ORAL | Status: AC
  Administered 2016-12-10: 40 meq via ORAL
  Filled 2016-12-10: qty 2

## 2016-12-10 MED ORDER — ISOSORBIDE MONONITRATE ER 30 MG PO TB24
30.0000 mg | ORAL_TABLET | Freq: Every day | ORAL | Status: DC
  Administered 2016-12-11 – 2016-12-14 (×4): 30 mg via ORAL
  Filled 2016-12-10 (×4): qty 1

## 2016-12-10 MED ORDER — SODIUM CHLORIDE 0.9 % IV SOLN: INTRAVENOUS | Status: DC

## 2016-12-10 MED ORDER — LEVOTHYROXINE SODIUM 50 MCG PO TABS
50.0000 ug | ORAL_TABLET | Freq: Every day | ORAL | Status: DC
  Administered 2016-12-10 – 2016-12-14 (×5): 50 ug via ORAL
  Filled 2016-12-10 (×5): qty 1

## 2016-12-10 MED ORDER — ENOXAPARIN SODIUM 40 MG/0.4ML ~~LOC~~ SOLN
40.0000 mg | SUBCUTANEOUS | Status: DC
  Administered 2016-12-10 – 2016-12-13 (×4): 40 mg via SUBCUTANEOUS
  Filled 2016-12-10 (×4): qty 0.4

## 2016-12-10 MED ORDER — ACETAMINOPHEN 325 MG PO TABS: 650.0000 mg | ORAL_TABLET | Freq: Four times a day (QID) | ORAL | Status: DC | PRN

## 2016-12-10 MED ORDER — SODIUM CHLORIDE 0.9 % IV SOLN
Freq: Once | INTRAVENOUS | Status: AC
  Administered 2016-12-10: 09:00:00 via INTRAVENOUS

## 2016-12-10 MED ORDER — AMLODIPINE BESYLATE 5 MG PO TABS
5.0000 mg | ORAL_TABLET | Freq: Every day | ORAL | Status: DC
  Administered 2016-12-11 – 2016-12-12 (×2): 5 mg via ORAL
  Filled 2016-12-10 (×2): qty 1

## 2016-12-10 MED ORDER — GLYBURIDE MICRONIZED 3 MG PO TABS
6.0000 mg | ORAL_TABLET | Freq: Two times a day (BID) | ORAL | Status: DC
  Filled 2016-12-10: qty 2

## 2016-12-10 MED ORDER — SODIUM CHLORIDE 0.9 % IV SOLN
INTRAVENOUS | Status: DC
Start: 2016-12-10 — End: 2016-12-11

## 2016-12-10 MED ORDER — LATANOPROST 0.005 % OP SOLN
1.0000 [drp] | Freq: Every day | OPHTHALMIC | Status: DC
  Administered 2016-12-10 – 2016-12-13 (×4): 1 [drp] via OPHTHALMIC
  Filled 2016-12-10: qty 2.5

## 2016-12-10 MED ORDER — SENNOSIDES-DOCUSATE SODIUM 8.6-50 MG PO TABS: 1.0000 | ORAL_TABLET | Freq: Two times a day (BID) | ORAL | Status: DC | PRN

## 2016-12-10 MED ORDER — NITROGLYCERIN 0.4 MG SL SUBL
0.4000 mg | SUBLINGUAL_TABLET | SUBLINGUAL | Status: DC | PRN
Start: 2016-12-10 — End: 2016-12-14

## 2016-12-10 MED ORDER — TRAMADOL HCL 50 MG PO TABS: 100.0000 mg | ORAL_TABLET | Freq: Four times a day (QID) | ORAL | Status: DC | PRN

## 2016-12-10 NOTE — ED Notes (Signed)
Family at bedside. 

## 2016-12-10 NOTE — ED Triage Notes (Signed)
Pt arrived via EMS from home for reports of altered mental status and unwitnessed fall. EMS reports pt incontinent of strong smelling urine on arrival, ST on monitor, 122/112, CBG 597.

## 2016-12-10 NOTE — ED Notes (Signed)
Dr. Jimmye Norman notified of troponin 0.04

## 2016-12-10 NOTE — Progress Notes (Signed)
   [name] [facility] [place] [facility] Day: 0 days Jody Dorsey is a 83 y.o. female presenting with Altered Mental Status and Fall  Known h/o diabetes, noted to be in ketoacidosis, being admitted to ICU for insulin drip. Also hypokalemia,  Advance care planning discussed with patient  And her daughter Jody Dorsey. All questions in regards to overall condition and expected prognosis answered. Daughter explained that patient is a DNR and has paperwork at home. Confirmed and code status being changed in the computer.  CODE STATUS: DNR Time spent: 18 minutes

## 2016-12-10 NOTE — H&P (Addendum)
[facility] [place] NAME: Jody Dorsey    MR#:  [id number]  DATE OF BIRTH:  03-21-1930  DATE OF ADMISSION:  12/10/2016  PRIMARY CARE PHYSICIAN: Frazier Richards M.D.  REQUESTING/REFERRING PHYSICIAN: Lenise Arena MD  CHIEF COMPLAINT:   Chief Complaint  Patient presents with  . Altered Mental Status  . Fall    HISTORY OF PRESENT ILLNESS: Jody Dorsey  is a 83 y.o. female with a known history of  Diabetes type 2, coronary artery disease, glaucoma, essential hypertension who was presenting after she was found on the floor by family member. Earlier this morning. Patient lives at home however her daughter lives next to her period and her husband went to check on her and he found the patient on the floor. According to the daughter she feels that the her mom was probably trying to get up after she fell but had a hard time getting up. When EMS arrived they noticed her sugar to be in the 500s. Here in the emergency room evaluation shows that she has elevated anion gap findings consistent with the DKA. According to the patient and the daughter she she was at her baseline. And did not have any recent illnesses.  To note patient is supposed to be taking Byetta twice daily according to her primary care provider's note however patient reports that she is only taking this once.  She feels very weak and deconditioned currently but denies any fevers chills no chest pain palpitations no nausea vomiting or diarrhea.  PAST MEDICAL HISTORY:   Past Medical History:  Diagnosis Date  . CAD (coronary artery disease)    status post 2 stents (Dr. Claiborne Billings, Dr. Elisabeth Cara, Dr. Fath-cardiologist) 2008  . DM (diabetes mellitus) ([place])   . Glaucoma   . HTN (hypertension)     PAST SURGICAL HISTORY: Past Surgical History:  Procedure Laterality Date  . CATARACT EXTRACTION     left eye  . CORONARY ANGIOPLASTY WITH STENT PLACEMENT     x 2    SOCIAL HISTORY:  Social  History  Substance Use Topics  . Smoking status: Never Smoker  . Smokeless tobacco: Not on file  . Alcohol use No    FAMILY HISTORY: No family history on file.  DRUG ALLERGIES:  Allergies  Allergen Reactions  . Norvasc [Amlodipine Besylate]     Generic Norvasc-->cough  . Celebrex [Celecoxib] Rash    Hives, itching      REVIEW OF SYSTEMS:   CONSTITUTIONAL: No fever,Positive fatigue and weakness.  EYES: No blurred or double vision.  EARS, NOSE, AND THROAT: No tinnitus or ear pain.  RESPIRATORY: No cough, shortness of breath, wheezing or hemoptysis.  CARDIOVASCULAR: No chest pain, orthopnea, edema.  GASTROINTESTINAL: No nausea, vomiting, diarrhea or abdominal pain.  GENITOURINARY: No dysuria, hematuria.  ENDOCRINE: No polyuria, nocturia,  HEMATOLOGY: No anemia, easy bruising or bleeding SKIN: No rash or lesion. MUSCULOSKELETAL: No joint pain or arthritis.   NEUROLOGIC: No tingling, numbness, weakness.  PSYCHIATRY: No anxiety or depression.   MEDICATIONS AT HOME:  Prior to Admission medications   Medication Sig Start Date End Date Taking? Authorizing Provider  acetaminophen (TYLENOL) 325 MG tablet Take 650 mg by mouth every 6 (six) hours as needed.   Yes Historical Provider, MD  amLODipine (NORVASC) 5 MG tablet Take 5 mg by mouth daily.    Yes Historical Provider, MD  cloNIDine (CATAPRES) 0.1 MG tablet Take 0.1 mg by mouth 2 (two) times daily.  Yes Historical Provider, MD  exenatide (BYETTA) 10 MCG/0.04ML SOPN injection Inject 10 mcg into the skin 2 (two) times daily with a meal.   Yes Historical Provider, MD  isosorbide mononitrate (IMDUR) 30 MG 24 hr tablet TAKE 1 TABLET DAILY 06/06/13  Yes Troy Sine, MD  latanoprost (XALATAN) 0.005 % ophthalmic solution Place 1 drop into both eyes at bedtime.    Yes Historical Provider, MD  metFORMIN (GLUCOPHAGE) 500 MG tablet Take 500 mg by mouth 2 (two) times daily with a meal.   Yes Historical Provider, MD  metoprolol succinate  (TOPROL-XL) 100 MG 24 hr tablet Take 100 mg by mouth 2 (two) times daily. Take with or immediately following a meal.   Yes Historical Provider, MD  nitroGLYCERIN (NITROSTAT) 0.4 MG SL tablet Place 0.4 mg under the tongue every 5 (five) minutes as needed.   Yes Historical Provider, MD  olmesartan-hydrochlorothiazide (BENICAR HCT) 40-25 MG tablet Take 1 tablet by mouth daily.   Yes Historical Provider, MD  senna-docusate (SENOKOT-S) 8.6-50 MG per tablet Take 1 tablet by mouth 2 (two) times daily as needed for constipation. 12/04/12  Yes Bynum Bellows, MD  aspirin 81 MG tablet Take 1 tablet (81 mg total) by mouth daily. Start on 12/07/2012 12/07/12   Bynum Bellows, MD  glyBURIDE micronized (GLYNASE) 6 MG tablet Take 6 mg by mouth 2 (two) times daily before a meal.    Historical Provider, MD  levothyroxine (SYNTHROID, LEVOTHROID) 50 MCG tablet Take 50 mcg by mouth daily.    Historical Provider, MD  Menthol-Methyl Salicylate (MUSCLE RUB) 10-15 % CREA Apply 1 application topically 2 (two) times daily as needed. 12/04/12   Srikar Janna Arch, MD  polymixin-bacitracin (POLYSPORIN) 500-10000 UNIT/GM OINT ointment Apply 1 application topically 2 (two) times daily. 12/04/12   Srikar Janna Arch, MD  potassium chloride (KLOR-CON) 8 MEQ tablet Take 8 mEq by mouth 2 (two) times daily.    Historical Provider, MD  rosuvastatin (CRESTOR) 5 MG tablet Take 5 mg by mouth every Monday, Wednesday, and Friday.    Historical Provider, MD  traMADol (ULTRAM-ER) 200 MG 24 hr tablet Take 200 mg by mouth 2 (two) times daily as needed.    Historical Provider, MD      PHYSICAL EXAMINATION:   VITAL SIGNS: Blood pressure (!) 187/103, pulse (!) 105, temperature (!) 95.2 F (35.1 C), temperature source Axillary, resp. rate 20, height 5\' 4"  (1.626 m), weight 106 lb 2 oz (48.1 kg), SpO2 99 %.  GENERAL:  83 y.o.-year-old patient lying in the bed with no acute distress.  EYES: Pupils equal, round, reactive to light and accommodation. No scleral  icterus. Extraocular muscles intact.  HEENT: Head atraumatic, normocephalic. Oropharynx Dry and nasopharynx clear.  NECK:  Supple, no jugular venous distention. No thyroid enlargement, no tenderness.  LUNGS: Normal breath sounds bilaterally, no wheezing, rales,rhonchi or crepitation. No use of accessory muscles of respiration.  CARDIOVASCULAR: S1, S2 normal. No murmurs, rubs, or gallops.  ABDOMEN: Soft, nontender, nondistended. Bowel sounds present. No organomegaly or mass.  EXTREMITIES: No pedal edema, cyanosis, or clubbing.  NEUROLOGIC: Cranial nerves II through XII are intact. Muscle strength 5/5 in all extremities. Sensation intact. Gait not checked.  PSYCHIATRIC: The patient is alert and oriented x 3.  SKIN: No obvious rash, lesion, or ulcer.   LABORATORY PANEL:   CBC  Recent Labs Lab 12/10/16 0901  WBC 11.9*  HGB 15.2  HCT 45.5  PLT 120*  MCV 93.2  MCH 31.1  MCHC 33.4  RDW 15.2*   ------------------------------------------------------------------------------------------------------------------  Chemistries   Recent Labs Lab 12/10/16 0901  NA 139  K 3.0*  CL 98*  CO2 18*  GLUCOSE 428*  BUN 27*  CREATININE 0.98  CALCIUM 9.3  AST 40  ALT 25  ALKPHOS 61  BILITOT 2.2*   ------------------------------------------------------------------------------------------------------------------ estimated creatinine clearance is 32.4 mL/min (by C-G formula based on SCr of 0.98 mg/dL). ------------------------------------------------------------------------------------------------------------------ No results for input(s): TSH, T4TOTAL, T3FREE, THYROIDAB in the last 72 hours.  Invalid input(s): FREET3   Coagulation profile No results for input(s): INR, PROTIME in the last 168 hours. ------------------------------------------------------------------------------------------------------------------- No results for input(s): DDIMER in the last 72  hours. -------------------------------------------------------------------------------------------------------------------  Cardiac Enzymes  Recent Labs Lab 12/10/16 0901  TROPONINI 0.04*   ------------------------------------------------------------------------------------------------------------------ Invalid input(s): POCBNP  ---------------------------------------------------------------------------------------------------------------  Urinalysis    Component Value Date/Time   COLORURINE YELLOW (A) 12/10/2016 0901   APPEARANCEUR CLEAR (A) 12/10/2016 0901   APPEARANCEUR Clear 11/30/2012 1717   LABSPEC 1.021 12/10/2016 0901   LABSPEC 1.009 11/30/2012 1717   PHURINE 5.0 12/10/2016 0901   GLUCOSEU >=500 (A) 12/10/2016 0901   GLUCOSEU >=500 11/30/2012 1717   HGBUR MODERATE (A) 12/10/2016 0901   BILIRUBINUR NEGATIVE 12/10/2016 0901   BILIRUBINUR Negative 11/30/2012 1717   KETONESUR 80 (A) 12/10/2016 0901   PROTEINUR >=300 (A) 12/10/2016 0901   UROBILINOGEN 1.0 08/27/2011 1223   NITRITE NEGATIVE 12/10/2016 0901   LEUKOCYTESUR NEGATIVE 12/10/2016 0901   LEUKOCYTESUR Negative 11/30/2012 1717     RADIOLOGY: Dg Chest 1 View  Result Date: 12/10/2016 CLINICAL DATA:  Altered mental status. Coronary artery disease, diabetes, and hypertension. EXAM: CHEST 1 VIEW COMPARISON:  11/30/2012 FINDINGS: The heart size and mediastinal contours are within normal limits. Aortic atherosclerosis noted. Both lungs are clear. No evidence of pleural effusion or pneumothorax. IMPRESSION: No active disease. Electronically Signed   By: Earle Gell M.D.   On: 12/10/2016 10:02   Ct Head Wo Contrast  Result Date: 12/10/2016 CLINICAL DATA:  Altered mental status.  Unwitnessed fall. EXAM: CT HEAD WITHOUT CONTRAST TECHNIQUE: Contiguous axial images were obtained from the base of the skull through the vertex without intravenous contrast. COMPARISON:  Head CT 12/04/2012 FINDINGS: Brain: Slightly progressive age  related cerebral atrophy, ventriculomegaly and periventricular white matter disease. No extra-axial fluid collections are identified. No CT findings for acute hemispheric infarction or intracranial hemorrhage. No mass lesions. The brainstem and cerebellum are normal. Vascular: Stable vascular calcifications. No aneurysm or hyperdense vessels. Skull: No skull fracture or bone lesion. Sinuses/Orbits: The paranasal sinuses and mastoid air cells are clear. The globes are intact. Other: No scalp lesion or hematoma. IMPRESSION: Progressive age related cerebral atrophy, ventriculomegaly and periventricular white matter disease since 2014. No acute intracranial findings or skull fracture. Electronically Signed   By: Marijo Sanes M.D.   On: 12/10/2016 09:42    EKG: Orders placed or performed during the hospital encounter of 12/10/16  . ED EKG  . ED EKG  . EKG 12-Lead  . EKG 12-Lead    IMPRESSION AND PLAN: Patient is a 83 year old white female presenting after being found on the floor   1. DKA I will treat patient with aggressive IV fluids. An insulin. Once her anion gap normalizes we'll start her on Byetta. Check a hemoglobin A1c.  2. Fall could be related to her being very dehydrated and DKA. We will monitor on telemetry Obtain echocardiogram of the heart  3. Hypokalemia we'll replace her potassium  4. Accelerated hypertension I will place  her on IV hydralazine I will continue all her home regimen.  5. Miscellaneous Lovenox for DVT prophylaxis  6. CODE STATUS confirmed with the patient's daughter and the patient she is a DO NOT RESUSCITATE   All the records are reviewed and case discussed with ED provider. Management plans discussed with the patient, family and they are in agreement.  CODE STATUS: Code Status History    Date Active Date Inactive Code Status Order ID Comments User Context   11/30/2012 10:08 PM 12/04/2012  7:42 PM DNR [id number]  Dion Body, RN Inpatient    Advance  Directive Documentation   Flowsheet Row Most Recent Value  Type of Advance Directive  Healthcare Power of Attorney, Living will  Pre-existing out of facility DNR order (yellow form or pink MOST form)  No data  "MOST" Form in Place?  No data       TOTAL TIME TAKING CARE OF THIS PATIENT: 55 min Critical care time spent  .    Dustin Flock M.D on 12/10/2016 at 11:42 AM  Between 7am to 6pm - Pager - [phone]  After 6pm go to [email or web address] - password [email or web address] [place] [facility]  Office  [phone]  CC: Primary care physician; PROVIDER NOT IN SYSTEM

## 2016-12-10 NOTE — ED Notes (Addendum)
Dr. Jimmye Norman notified pH 7.17.

## 2016-12-10 NOTE — ED Provider Notes (Addendum)
[facility] Emergency Department Provider Note    L5 caveat: Review of systems limited by altered mental status    Time seen: ----------------------------------------- 9:01 AM on 12/10/2016 -----------------------------------------    I have reviewed the triage vital signs and the nursing notes.   HISTORY  Chief Complaint Altered Mental Status and Fall    HPI Jody Dorsey is a 83 y.o. female who presents to the ER for altered mental status and unwitnessed fall. She comes from home, EMS reports strong smell of urine on arrival. Blood pressure is mildly elevated, blood sugar was 597.   Past Medical History:  Diagnosis Date  . CAD (coronary artery disease)    status post 2 stents (Dr. Claiborne Billings, Dr. Elisabeth Cara, Dr. Fath-cardiologist) 2008  . DM (diabetes mellitus) ([place])   . Glaucoma   . HTN (hypertension)     Patient Active Problem List   Diagnosis Date Noted  . Closed head injury with petechial brain hemorrhage ([place]) 11/30/2012  . SDH (subdural hematoma) ([place]) 11/30/2012  . Diabetes mellitus ([place]) 11/30/2012  . Laceration 11/30/2012    Past Surgical History:  Procedure Laterality Date  . CATARACT EXTRACTION     left eye  . CORONARY ANGIOPLASTY WITH STENT PLACEMENT     x 2    Allergies Norvasc [amlodipine besylate] and Celebrex [celecoxib]  Social History Social History  Substance Use Topics  . Smoking status: Never Smoker  . Smokeless tobacco: Not on file  . Alcohol use No    Review of Systems Unknown at this time  ____________________________________________   PHYSICAL EXAM:  VITAL SIGNS: ED Triage Vitals [12/10/16 0855]  Enc Vitals Group     BP (!) 166/108     Pulse Rate (!) 113     Resp 18     Temp (!) 95.2 F (35.1 C)     Temp Source Axillary     SpO2 97 %     Weight 106 lb 2 oz (48.1 kg)     Height 5\' 4"  (1.626 m)     Head Circumference      Peak Flow      Pain Score      Pain Loc      Pain Edu?      Excl. in  [place]?     Constitutional: Alert But disoriented, mild distress Eyes: Conjunctivae are normal. Normal extraocular movements. ENT   Head: Normocephalic and atraumatic.   Nose: No congestion/rhinnorhea.   Mouth/Throat: Mucous membranes are moist.   Neck: No stridor. Cardiovascular: Normal rate, regular rhythm. No murmurs, rubs, or gallops. Respiratory: Normal respiratory effort without tachypnea nor retractions. Breath sounds are clear and equal bilaterally. No wheezes/rales/rhonchi. Gastrointestinal: Soft and nontender. Normal bowel sounds Musculoskeletal: Nontender with normal range of motion in all extremities. No lower extremity tenderness nor edema. Neurologic:  No gross focal neurologic deficits are appreciated. Confusion, tremor Skin:  Skin is warm, dry and intact. No rash noted. ____________________________________________  EKG: Interpreted by me. Sinus tachycardia with rate of 114 bpm, normal PR interval, wide QRS, LVH, long QT, right bundle branch block, left anterior fascicular block  ____________________________________________  ED COURSE:  Pertinent labs & imaging results that were available during my care of the patient were reviewed by me and considered in my medical decision making (see chart for details). Patient brought to the ER for fall and altered mental status. We will assess with labs and imaging.   Procedures ____________________________________________   LABS (pertinent positives/negatives)  Labs Reviewed  COMPREHENSIVE METABOLIC PANEL - Abnormal; Notable for the following:       Result Value   Potassium 3.0 (*)    Chloride 98 (*)    CO2 18 (*)    Glucose, Bld 428 (*)    BUN 27 (*)    Total Bilirubin 2.2 (*)    GFR calc non Af Amer 52 (*)    GFR calc Af Amer 60 (*)    Anion gap 23 (*)    All other components within normal limits  CBC - Abnormal; Notable for the following:    WBC 11.9 (*)    RDW 15.2 (*)    All other components within  normal limits  URINALYSIS, COMPLETE (UACMP) WITH MICROSCOPIC - Abnormal; Notable for the following:    Color, Urine YELLOW (*)    APPearance CLEAR (*)    Glucose, UA >=500 (*)    Hgb urine dipstick MODERATE (*)    Ketones, ur 80 (*)    Protein, ur >=300 (*)    Bacteria, UA RARE (*)    Squamous Epithelial / LPF 0-5 (*)    All other components within normal limits  TROPONIN I - Abnormal; Notable for the following:    Troponin I 0.04 (*)    All other components within normal limits  GLUCOSE, CAPILLARY - Abnormal; Notable for the following:    Glucose-Capillary 413 (*)    All other components within normal limits  CK - Abnormal; Notable for the following:    Total CK 864 (*)    All other components within normal limits  CBG MONITORING, ED   CRITICAL CARE Performed by: Earleen Newport   Total critical care time: 30 minutes  Critical care time was exclusive of separately billable procedures and treating other patients.  Critical care was necessary to treat or prevent imminent or life-threatening deterioration.  Critical care was time spent personally by me on the following activities: development of treatment plan with patient and/or surrogate as well as nursing, discussions with consultants, evaluation of patient's response to treatment, examination of patient, obtaining history from patient or surrogate, ordering and performing treatments and interventions, ordering and review of laboratory studies, ordering and review of radiographic studies, pulse oximetry and re-evaluation of patient's condition.  RADIOLOGY Images were viewed by me  CT head, chest x-ray IMPRESSION: No active disease. IMPRESSION: Progressive age related cerebral atrophy, ventriculomegaly and periventricular white matter disease since 2014.  No acute intracranial findings or skull fracture. ____________________________________________  FINAL ASSESSMENT AND PLAN  Altered mental status, fall, Mild  rhabdomyolysis, DKA, dehydration  Plan: Patient with labs and imaging as dictated above. Patient presented to the ER after a fall, found to be dehydrated, mildly elevated CK levels and high glucose. Findings are likely indicative of diabetic ketoacidosis. We have started her on saline hydration and insulin. She will need to be admitted for observation and have social worker work with her. She is certainly not capable of taking care for herself at home. She remains somewhat confused.   Earleen Newport, MD   Note: This note was generated in part or whole with voice recognition software. Voice recognition is usually quite accurate but there are transcription errors that can and very often do occur. I apologize for any typographical errors that were not detected and corrected.     Earleen Newport, MD 12/10/16 1019    Earleen Newport, MD 12/10/16 1102    Earleen Newport, MD 12/10/16 [phone]

## 2016-12-11 LAB — BASIC METABOLIC PANEL
ANION GAP: 7 (ref 5–15)
Anion gap: 11 (ref 5–15)
BUN: 24 mg/dL — AB (ref 6–20)
BUN: 26 mg/dL — AB (ref 6–20)
CALCIUM: 8.6 mg/dL — AB (ref 8.9–10.3)
CO2: 22 mmol/L (ref 22–32)
CO2: 22 mmol/L (ref 22–32)
Calcium: 8.5 mg/dL — ABNORMAL LOW (ref 8.9–10.3)
Chloride: 105 mmol/L (ref 101–111)
Chloride: 109 mmol/L (ref 101–111)
Creatinine, Ser: 0.87 mg/dL (ref 0.44–1.00)
Creatinine, Ser: 0.88 mg/dL (ref 0.44–1.00)
GFR calc Af Amer: >60 mL/min (ref >60)
GFR calc Af Amer: >60 mL/min (ref >60)
GFR, EST NON AFRICAN AMERICAN: 59 mL/min — AB (ref >60)
GFR, EST NON AFRICAN AMERICAN: 59 mL/min — AB (ref >60)
GLUCOSE: 150 mg/dL — AB (ref 65–99)
GLUCOSE: 189 mg/dL — AB (ref 65–99)
POTASSIUM: 3.3 mmol/L — AB (ref 3.5–5.1)
POTASSIUM: 4.2 mmol/L (ref 3.5–5.1)
Sodium: 138 mmol/L (ref 135–145)
Sodium: 138 mmol/L (ref 135–145)

## 2016-12-11 LAB — GLUCOSE, CAPILLARY
GLUCOSE-CAPILLARY: 205 mg/dL — AB (ref 65–99)
GLUCOSE-CAPILLARY: 149 mg/dL — AB (ref 65–99)
GLUCOSE-CAPILLARY: 265 mg/dL — AB (ref 65–99)
GLUCOSE-CAPILLARY: 133 mg/dL — AB (ref 65–99)
Glucose-Capillary: 158 mg/dL — ABNORMAL HIGH (ref 65–99)
Glucose-Capillary: 204 mg/dL — ABNORMAL HIGH (ref 65–99)
Glucose-Capillary: 212 mg/dL — ABNORMAL HIGH (ref 65–99)
Glucose-Capillary: 224 mg/dL — ABNORMAL HIGH (ref 65–99)
Glucose-Capillary: 230 mg/dL — ABNORMAL HIGH (ref 65–99)

## 2016-12-11 LAB — HEMOGLOBIN A1C
Hgb A1c MFr Bld: 11.5 % — ABNORMAL HIGH (ref 4.8–5.6)
Mean Plasma Glucose: 283 mg/dL

## 2016-12-11 MED ORDER — INSULIN ASPART 100 UNIT/ML ~~LOC~~ SOLN
0.0000 [IU] | Freq: Every day | SUBCUTANEOUS | Status: DC
  Administered 2016-12-11: 2 [IU] via SUBCUTANEOUS
  Administered 2016-12-13: 0 [IU] via SUBCUTANEOUS
  Filled 2016-12-11: qty 2

## 2016-12-11 MED ORDER — INSULIN ASPART 100 UNIT/ML ~~LOC~~ SOLN
0.0000 [IU] | Freq: Three times a day (TID) | SUBCUTANEOUS | Status: DC
  Administered 2016-12-11: 5 [IU] via SUBCUTANEOUS
  Administered 2016-12-11 – 2016-12-12 (×4): 1 [IU] via SUBCUTANEOUS
  Administered 2016-12-13: 2 [IU] via SUBCUTANEOUS
  Administered 2016-12-13: 1 [IU] via SUBCUTANEOUS
  Filled 2016-12-11: qty 1
  Filled 2016-12-11: qty 2
  Filled 2016-12-11: qty 1
  Filled 2016-12-11: qty 5
  Filled 2016-12-11 (×3): qty 1

## 2016-12-11 MED ORDER — POTASSIUM CHLORIDE IN NACL 40-0.9 MEQ/L-% IV SOLN
INTRAVENOUS | Status: DC
  Administered 2016-12-11: 100 mL/h via INTRAVENOUS
  Filled 2016-12-11 (×3): qty 1000

## 2016-12-11 MED ORDER — INSULIN ASPART 100 UNIT/ML ~~LOC~~ SOLN
3.0000 [IU] | Freq: Three times a day (TID) | SUBCUTANEOUS | Status: DC
  Administered 2016-12-11: 3 [IU] via SUBCUTANEOUS
  Filled 2016-12-11: qty 3

## 2016-12-11 MED ORDER — INSULIN GLARGINE 100 UNIT/ML ~~LOC~~ SOLN
14.0000 [IU] | SUBCUTANEOUS | Status: DC
  Administered 2016-12-11: 14 [IU] via SUBCUTANEOUS
  Filled 2016-12-11 (×2): qty 0.14

## 2016-12-11 MED ORDER — INSULIN GLARGINE 100 UNIT/ML ~~LOC~~ SOLN
14.0000 [IU] | Freq: Every day | SUBCUTANEOUS | Status: DC
  Administered 2016-12-11: 14 [IU] via SUBCUTANEOUS
  Filled 2016-12-11: qty 0.14

## 2016-12-11 NOTE — Care Management Important Message (Addendum)
Important Message  Patient Details  Name: Jody Dorsey MRN: [id number] Date of Birth: [birth year]   Medicare Important Message Given:  Yes Initial signed IM printed from Epic and given to son    Mathis Bud 12/11/2016, 8:47 AM

## 2016-12-11 NOTE — Progress Notes (Signed)
[facility] [place] NAME: Jody Dorsey    MR#:  [id number]  DATE OF BIRTH:  12-10-1929  SUBJECTIVE:  CHIEF COMPLAINT:   Chief Complaint  Patient presents with  . Altered Mental Status  . Fall   Patient has no complaints but demented. REVIEW OF SYSTEMS:  Review of Systems  Constitutional: Negative for chills and fever.  HENT: Negative for congestion and tinnitus.   Eyes: Negative for blurred vision and double vision.  Respiratory: Negative for cough, shortness of breath and stridor.   Cardiovascular: Negative for chest pain and leg swelling.  Gastrointestinal: Negative for abdominal pain, blood in stool, constipation, diarrhea, melena, nausea and vomiting.  Genitourinary: Negative for dysuria, frequency and hematuria.  Musculoskeletal: Negative for back pain.  Neurological: Negative for dizziness, focal weakness, loss of consciousness and weakness.  Psychiatric/Behavioral: Negative for depression. The patient is not nervous/anxious.     DRUG ALLERGIES:   Allergies  Allergen Reactions  . Norvasc [Amlodipine Besylate]     Generic Norvasc-->cough  . Celebrex [Celecoxib] Rash    Hives, itching     VITALS:  Blood pressure 119/61, pulse 76, temperature 97.4 F (36.3 C), temperature source Oral, resp. rate 18, height 5\' 4"  (1.626 m), weight 125 lb 6.4 oz (56.9 kg), SpO2 100 %. PHYSICAL EXAMINATION:  Physical Exam  Constitutional: She is well-developed, well-nourished, and in no distress.  HENT:  Head: Normocephalic.  Mouth/Throat: Oropharynx is clear and moist.  Eyes: Conjunctivae and EOM are normal.  Neck: Normal range of motion. Neck supple. No JVD present. No tracheal deviation present.  Cardiovascular: Normal rate, regular rhythm and normal heart sounds.  Exam reveals no gallop.   No murmur heard. Pulmonary/Chest: Effort normal and breath sounds normal. No respiratory distress. She has no wheezes. She has no rales.    Abdominal: Soft. Bowel sounds are normal. She exhibits no distension. There is no tenderness.  Musculoskeletal: Normal range of motion. She exhibits no edema or tenderness.  Neurological: No cranial nerve deficit.  AAOx1-2  Skin: No rash noted. No erythema.  Psychiatric:  Looks demented   LABORATORY PANEL:   CBC  Recent Labs Lab 12/10/16 0901  WBC 11.9*  HGB 15.2  HCT 45.5  PLT 120*   ------------------------------------------------------------------------------------------------------------------ Chemistries   Recent Labs Lab 12/10/16 0901  12/11/16 0519  NA 139  < > 138  K 3.0*  < > 4.2  CL 98*  < > 109  CO2 18*  < > 22  GLUCOSE 428*  < > 150*  BUN 27*  < > 24*  CREATININE 0.98  < > 0.88  CALCIUM 9.3  < > 8.5*  AST 40  --   --   ALT 25  --   --   ALKPHOS 61  --   --   BILITOT 2.2*  --   --   < > = values in this interval not displayed. RADIOLOGY:  No results found. ASSESSMENT AND PLAN:   Patient is a 83 year old white female presenting after being found on the floor   1. DKA,  Improved with insulin drip and aggressive IV fluids. On lantus 14 units HS, metformin and SL. Add novolog 3 units AC. Check a hemoglobin A1c.  2. Fall could be related to her being very dehydrated and DKA. f/u echocardiogram of the heart, PT.  3. Hypokalemia. Improved with potassium supplement.  4. Accelerated hypertension. on IV hydralazine continue all her home regimen.  Generalized weakness. PT  evaluation.  All the records are reviewed and case discussed with Care Management/Social Worker. Management plans discussed with the patient, her daughter and they are in agreement.  CODE STATUS: DNR.  TOTAL TIME TAKING CARE OF THIS PATIENT: 35  minutes.   More than 50% of the time was spent in counseling/coordination of care: YES  POSSIBLE D/C IN 1-2 DAYS, DEPENDING ON CLINICAL CONDITION.   Demetrios Loll M.D on 12/11/2016 at 3:01 PM  Between 7am to 6pm - Pager -  [phone]  After 6pm go to [email or web address] - Proofreader  [facility] [place] [facility]  Office  [phone]  CC: Primary care physician; PROVIDER NOT IN SYSTEM  Note: This dictation was prepared with Dragon dictation along with smaller phrase technology. Any transcriptional errors that result from this process are unintentional.

## 2016-12-11 NOTE — Care Management (Signed)
CM attempted to assess.  Physical therapy is assessing patient. Patient requiring much verbal cuing to sit on the side of bed.

## 2016-12-11 NOTE — Progress Notes (Signed)
Inpatient Diabetes Program Recommendations  AACE/ADA: New Consensus Statement on Inpatient Glycemic Control (2015)  Target Ranges:  Prepandial:   less than 140 mg/dL      Peak postprandial:   less than 180 mg/dL (1-2 hours)      Critically ill patients:  140 - 180 mg/dL   Lab Results  Component Value Date   GLUCAP 265 (H) 12/11/2016   HGBA1C 11.5 (H) 12/10/2016    Review of Glycemic Control  Results for SASHEEN, LAGRASSA (MRN [id number]) as of 12/11/2016 15:11  Ref. Range 12/11/2016 01:33 12/11/2016 02:48 12/11/2016 03:17 12/11/2016 07:35 12/11/2016 12:20  Glucose-Capillary Latest Ref Range: 65 - 99 mg/dL 230 (H) 204 (H) 205 (H) 149 (H) 265 (H)    Assessed patient at bedside- daughter in attendance.  Patient was very sleepy- difficult to keep awake- responded that she is taking Byetta 2x/day, Glipizide 2x/day and Metformin 2x/day.  Unsure how reliable this information is since it was so difficult to keep patient roused. Patient is not wearing hearing aids.    Daughter stated "I don't know if she takes it(Byetta) twice a day,  I'm only there in the evening to see her give it".  Daughter tells me she watches as her mother prepares her medication pillbox for the week.  Assures me her mother checks her sugars but isn't sure what the sugars are because "she keeps her meter in her room".  Daughter does confirm that her mother asks for supplies when she runs out.   Discussed A1C with daughter and how it correlates to blood sugar numbers. Discussed the use of insulin while in the hospital.  If patient is discharged home with insulin, daughter would have to give it as I believe, based on this interaction, patient would be unable to do it.   Gentry Fitz, RN, BA, MHA, CDE Diabetes Coordinator Inpatient Diabetes Program  [phone] (Team Pager) [phone] (Circle) 12/11/2016 3:17 PM

## 2016-12-11 NOTE — Progress Notes (Signed)
Inpatient Diabetes Program Recommendations  AACE/ADA: New Consensus Statement on Inpatient Glycemic Control (2015)  Target Ranges:  Prepandial:   less than 140 mg/dL      Peak postprandial:   less than 180 mg/dL (1-2 hours)      Critically ill patients:  140 - 180 mg/dL   Lab Results  Component Value Date   GLUCAP 265 (H) 12/11/2016   HGBA1C 11.5 (H) 12/10/2016    Review of Glycemic Control:  Results for AHONESTI, EGBERS (MRN [id number]) as of 12/11/2016 12:48  Ref. Range 12/11/2016 01:33 12/11/2016 02:48 12/11/2016 03:17 12/11/2016 07:35 12/11/2016 12:20  Glucose-Capillary Latest Ref Range: 65 - 99 mg/dL 230 (H) 204 (H) 205 (H) 149 (H) 265 (H)   Diabetes history: Type 2 diabetes Outpatient Diabetes medications: Glynase 6 mg daily, Byetta 10 mcg bid, Metformin 500 mg bid Current orders for Inpatient glycemic control:  Novolog sensitive tid with meals and HS, Lantus 14 units q 24 hours  Inpatient Diabetes Program Recommendations:    Agree with current orders.  May consider adding Novolog meal coverage 3 units tid with meals (hodl if patient eats less than 50%).  A1C indicates that average blood sugar 283 mg/dL.  May need low dose basal insulin at d/c.   Thanks, Adah Perl, RN, BC-ADM Inpatient Diabetes Coordinator Pager [phone] (8a-5p)

## 2016-12-11 NOTE — Evaluation (Signed)
Physical Therapy Evaluation Patient Details Name: Jody Dorsey MRN: [id number] DOB: 28-Nov-1929 Today's Date: 12/11/2016   History of Present Illness  Pt admitted for diabetic ketoacidosis. Pt with history of DM, CAD, glaucoma, and HTN.  Clinical Impression  Pt is a pleasant 83 year old female who was admitted for diabetic ketoacidosis. Pt very confused and is poor historian. Most of history obtained from son in law in room.  Pt performs bed mobility with max assist to get to sitting. Pt very lethargic and unable to stay awake for further participation. Returned back to bed and repositioned.  Pt demonstrates deficits with strength/cognition/weakness/mobility. Pt is currently not at baseline level. Would benefit from skilled PT to address above deficits and promote optimal return to PLOF; recommend transition to STR upon discharge from acute hospitalization.       Follow Up Recommendations SNF    Equipment Recommendations  Rolling walker with 5" wheels    Recommendations for Other Services       Precautions / Restrictions Precautions Precautions: Fall Restrictions Weight Bearing Restrictions: No      Mobility  Bed Mobility Overal bed mobility: Needs Assistance Bed Mobility: Supine to Sit     Supine to sit: Max assist;Total assist     General bed mobility comments: heavy assist for bed mobility secondary to lethargy. Only able to keep eyes open for a few seconds then falls asleep quickly. Unable to further assess mobility at this time.  Transfers                 General transfer comment: not safe for attempt secondary to lethargy at this time  Ambulation/Gait                Stairs            Wheelchair Mobility    Modified Rankin (Stroke Patients Only)       Balance Overall balance assessment: Needs assistance;History of Falls Sitting-balance support: Feet supported;Bilateral upper extremity supported Sitting balance-Leahy Scale: Poor                                        Pertinent Vitals/Pain Pain Assessment: No/denies pain    Home Living Family/patient expects to be discharged to:: Private residence Living Arrangements: Alone   Type of Home: House Home Access: Stairs to enter Entrance Stairs-Rails: Left Entrance Stairs-Number of Steps: 3 Home Layout: One level Home Equipment: Cane - quad Additional Comments: Per report, family lives next door and is available for assistance    Prior Function Level of Independence: Independent with assistive device(s)         Comments: uses QC for all mobility     Hand Dominance        Extremity/Trunk Assessment   Upper Extremity Assessment Upper Extremity Assessment: Generalized weakness (B UE grossly 3/5; unsure of good effort)    Lower Extremity Assessment Lower Extremity Assessment: Generalized weakness (grossly 3+/5, unsure of good effort)       Communication   Communication: No difficulties  Cognition Arousal/Alertness: Lethargic;Suspect due to medications Behavior During Therapy: [facility] for tasks assessed/performed Overall Cognitive Status: Impaired/Different from baseline                      General Comments      Exercises Other Exercises Other Exercises: attempted supine ther-ex including SLRs and hip abd/add, however pt unable to  maintain arousal level    Assessment/Plan    PT Assessment Patient needs continued PT services  PT Problem List Decreased strength;Decreased balance;Decreased mobility;Decreased coordination;Decreased cognition          PT Treatment Interventions Gait training;DME instruction;Therapeutic activities;Therapeutic exercise;Balance training    PT Goals (Current goals can be found in the Care Plan section)  Acute Rehab PT Goals Patient Stated Goal: unable to state PT Goal Formulation: Patient unable to participate in goal setting Time For Goal Achievement: 12/25/16 Potential to Achieve Goals:  Good    Frequency Min 2X/week   Barriers to discharge        Co-evaluation               End of Session   Activity Tolerance: Patient limited by lethargy Patient left: in bed;with bed alarm set Nurse Communication: Mobility status         Time: [id number] PT Time Calculation (min) (ACUTE ONLY): 17 min   Charges:   PT Evaluation $PT Eval Moderate Complexity: 1 Procedure     PT G Codes:        Javi Bollman 2017/01/06, 8:30 PM  Greggory Stallion, PT, DPT [phone]

## 2016-12-12 LAB — MRSA CULTURE: CULTURE: NOT DETECTED

## 2016-12-12 LAB — BASIC METABOLIC PANEL
Anion gap: 5 (ref 5–15)
BUN: 26 mg/dL — AB (ref 6–20)
CHLORIDE: 102 mmol/L (ref 101–111)
CO2: 26 mmol/L (ref 22–32)
Calcium: 8.6 mg/dL — ABNORMAL LOW (ref 8.9–10.3)
Creatinine, Ser: 0.87 mg/dL (ref 0.44–1.00)
GFR calc Af Amer: >60 mL/min (ref >60)
GFR calc non Af Amer: 59 mL/min — ABNORMAL LOW (ref >60)
Glucose, Bld: 117 mg/dL — ABNORMAL HIGH (ref 65–99)
POTASSIUM: 3.5 mmol/L (ref 3.5–5.1)
Sodium: 133 mmol/L — ABNORMAL LOW (ref 135–145)

## 2016-12-12 LAB — GLUCOSE, CAPILLARY
GLUCOSE-CAPILLARY: 153 mg/dL — AB (ref 65–99)
GLUCOSE-CAPILLARY: 136 mg/dL — AB (ref 65–99)
GLUCOSE-CAPILLARY: 190 mg/dL — AB (ref 65–99)
Glucose-Capillary: 113 mg/dL — ABNORMAL HIGH (ref 65–99)
Glucose-Capillary: 147 mg/dL — ABNORMAL HIGH (ref 65–99)

## 2016-12-12 MED ORDER — INSULIN GLARGINE 100 UNIT/ML ~~LOC~~ SOLN
16.0000 [IU] | Freq: Every day | SUBCUTANEOUS | Status: DC
  Administered 2016-12-12 – 2016-12-13 (×2): 16 [IU] via SUBCUTANEOUS
  Filled 2016-12-12 (×3): qty 0.16

## 2016-12-12 MED ORDER — SODIUM CHLORIDE 0.9 % IV BOLUS (SEPSIS)
500.0000 mL | Freq: Once | INTRAVENOUS | Status: AC
  Administered 2016-12-12: 500 mL via INTRAVENOUS

## 2016-12-12 MED ORDER — INSULIN ASPART 100 UNIT/ML ~~LOC~~ SOLN
5.0000 [IU] | Freq: Three times a day (TID) | SUBCUTANEOUS | Status: DC
  Administered 2016-12-12 – 2016-12-14 (×8): 5 [IU] via SUBCUTANEOUS
  Filled 2016-12-12 (×9): qty 5

## 2016-12-12 NOTE — Progress Notes (Signed)
Patient lethargic all day - only woke up when roused to eat, drink or provide care. Confused when awake. BP improved after receiving bolus of IV fluid. HR stable. Diaper in place. Blood sugars ranged from 113 to 147 today. Cover with insulin as ordered. Patient appears weak. Family at bedside all day. No complaints of pain or any discomfort.

## 2016-12-12 NOTE — Progress Notes (Signed)
11:00 RN spoke to Physical Therapy reminded that they need to see patient. RN spoke to patient daughter re- insulin administration as patient is not able to draw up medication and administer it herself. Daughter stated she is very familiar with drawing up and administering insulin - but her husband will have to do that if patient is discharged home since she (daughter) works. Husband will be able to assist since he is retired and they live next door to patient.Marland Kitchen

## 2016-12-12 NOTE — NC FL2 (Signed)
[place] LEVEL OF CARE SCREENING TOOL     IDENTIFICATION  Patient Name: Jody Dorsey Birthdate: 09/17/1930 Sex: female Admission Date (Current Location): 12/10/2016  [place] and [place] Number:      Facility and Address:  [facility], [address], [place],  [address]      Provider Number: [id number]  Attending Physician Name and Address:  Theodoro Grist, MD  Relative Name and Phone Number:       Current Level of Care: Hospital Recommended Level of Care: [place] Prior Approval Number:    Date Approved/Denied: 12/12/16 PASRR Number: [id number]  Discharge Plan: SNF    Current Diagnoses: Patient Active Problem List   Diagnosis Date Noted  . DKA (diabetic ketoacidoses) ([place]) 12/10/2016  . Pressure injury of skin 12/10/2016  . Closed head injury with petechial brain hemorrhage ([place]) 11/30/2012  . SDH (subdural hematoma) ([place]) 11/30/2012  . Diabetes mellitus ([place]) 11/30/2012  . Laceration 11/30/2012    Orientation RESPIRATION BLADDER Height & Weight     Self, Time  Normal Continent Weight: 125 lb 6.4 oz (56.9 kg) Height:  5\' 4"  (162.6 cm)  BEHAVIORAL SYMPTOMS/MOOD NEUROLOGICAL BOWEL NUTRITION STATUS      Continent Diet (Diabetes diet)  AMBULATORY STATUS COMMUNICATION OF NEEDS Skin   Extensive Assist Verbally Normal                       Personal Care Assistance Level of Assistance  Bathing, Dressing, Feeding Bathing Assistance: Limited assistance Feeding assistance: Independent Dressing Assistance: Limited assistance     Functional Limitations Info             SPECIAL CARE FACTORS FREQUENCY  PT (By licensed PT), OT (By licensed OT)     PT Frequency: Up to 5X per day, 5 days per week OT Frequency: Up to 5X per day, 5 days per week            Contractures Contractures Info: Present    Additional Factors Info  Code Status, Allergies Code Status Info: DNR Allergies Info:  Norvasc Amlodipine Besylate, Celebrex Celecoxib           Current Medications (12/12/2016):  This is the current hospital active medication list Current Facility-Administered Medications  Medication Dose Route Frequency Provider Last Rate Last Dose  . acetaminophen (TYLENOL) tablet 650 mg  650 mg Oral Q6H PRN Dustin Flock, MD      . amLODipine (NORVASC) tablet 5 mg  5 mg Oral Daily Dustin Flock, MD   5 mg at 12/12/16 0926  . aspirin EC tablet 81 mg  81 mg Oral Daily Dustin Flock, MD   81 mg at 12/12/16 0926  . cloNIDine (CATAPRES) tablet 0.1 mg  0.1 mg Oral BID Dustin Flock, MD   0.1 mg at 12/12/16 0926  . enoxaparin (LOVENOX) injection 40 mg  40 mg Subcutaneous Q24H Dustin Flock, MD   40 mg at 12/11/16 2100  . hydrALAZINE (APRESOLINE) injection 10 mg  10 mg Intravenous Q6H PRN Dustin Flock, MD   10 mg at 12/11/16 [id number]  . insulin aspart (novoLOG) injection 0-5 Units  0-5 Units Subcutaneous QHS Alexis Hugelmeyer, DO   2 Units at 12/11/16 2100  . insulin aspart (novoLOG) injection 0-9 Units  0-9 Units Subcutaneous TID WC Alexis Hugelmeyer, DO   1 Units at 12/12/16 1214  . insulin aspart (novoLOG) injection 5 Units  5 Units Subcutaneous TID WC Theodoro Grist, MD   5 Units  at 12/12/16 1213  . insulin glargine (LANTUS) injection 16 Units  16 Units Subcutaneous QHS Theodoro Grist, MD      . isosorbide mononitrate (IMDUR) 24 hr tablet 30 mg  30 mg Oral Daily Dustin Flock, MD   30 mg at 12/12/16 0926  . latanoprost (XALATAN) 0.005 % ophthalmic solution 1 drop  1 drop Both Eyes QHS Dustin Flock, MD   1 drop at 12/11/16 2154  . levothyroxine (SYNTHROID, LEVOTHROID) tablet 50 mcg  50 mcg Oral QAC breakfast Dustin Flock, MD   50 mcg at 12/12/16 0801  . metFORMIN (GLUCOPHAGE) tablet 500 mg  500 mg Oral BID WC Dustin Flock, MD   500 mg at 12/12/16 0801  . metoprolol succinate (TOPROL-XL) 24 hr tablet 100 mg  100 mg Oral BID Dustin Flock, MD   100 mg at 12/12/16 0926  . nitroGLYCERIN  (NITROSTAT) SL tablet 0.4 mg  0.4 mg Sublingual Q5 min PRN Dustin Flock, MD      . rosuvastatin (CRESTOR) tablet 5 mg  5 mg Oral Q M,W,F Dustin Flock, MD   5 mg at 12/11/16 1059  . senna-docusate (Senokot-S) tablet 1 tablet  1 tablet Oral BID PRN Dustin Flock, MD      . traMADol Veatrice Bourbon) tablet 100 mg  100 mg Oral Q6H PRN Dustin Flock, MD         Discharge Medications: Please see discharge summary for a list of discharge medications.  Relevant Imaging Results:  Relevant Lab Results:   Additional Information SS# [id number]  Zettie Pho, LCSW

## 2016-12-12 NOTE — Progress Notes (Signed)
[place] [facility] [place] NAME: Jody Dorsey    MR#:  [id number]  DATE OF BIRTH:  1930/02/01  SUBJECTIVE:  CHIEF COMPLAINT:   Chief Complaint  Patient presents with  . Altered Mental Status  . Fall   Patient has no complaints but demented.Lives alone, patient's daughter and son-in-law checks on her intermittently. Denies discomfort, however, remained very somnolent today, per nursing staff. Oral intake has been good REVIEW OF SYSTEMS:  Review of Systems  Constitutional: Negative for chills and fever.  HENT: Negative for congestion and tinnitus.   Eyes: Negative for blurred vision and double vision.  Respiratory: Negative for cough, shortness of breath and stridor.   Cardiovascular: Negative for chest pain and leg swelling.  Gastrointestinal: Negative for abdominal pain, blood in stool, constipation, diarrhea, melena, nausea and vomiting.  Genitourinary: Negative for dysuria, frequency and hematuria.  Musculoskeletal: Negative for back pain.  Neurological: Negative for dizziness, focal weakness, loss of consciousness and weakness.  Psychiatric/Behavioral: Negative for depression. The patient is not nervous/anxious.     DRUG ALLERGIES:   Allergies  Allergen Reactions  . Norvasc [Amlodipine Besylate]     Generic Norvasc-->cough  . Celebrex [Celecoxib] Rash    Hives, itching     VITALS:  Blood pressure (!) 100/50, pulse 63, temperature 98.9 F (37.2 C), temperature source Oral, resp. rate 18, height 5\' 4"  (1.626 m), weight 56.9 kg (125 lb 6.4 oz), SpO2 98 %. PHYSICAL EXAMINATION:  Physical Exam  Constitutional: She is well-developed, well-nourished, and in no distress.  HENT:  Head: Normocephalic.  Mouth/Throat: Oropharynx is clear and moist.  Eyes: Conjunctivae and EOM are normal.  Neck: Normal range of motion. Neck supple. No JVD present. No tracheal deviation present.  Cardiovascular: Normal rate, regular rhythm and normal  heart sounds.  Exam reveals no gallop.   No murmur heard. Pulmonary/Chest: Effort normal and breath sounds normal. No respiratory distress. She has no wheezes. She has no rales.  Abdominal: Soft. Bowel sounds are normal. She exhibits no distension. There is no tenderness.  Musculoskeletal: Normal range of motion. She exhibits no edema or tenderness.  Neurological: No cranial nerve deficit.  AAOx1-2  Skin: No rash noted. No erythema.  Psychiatric:  Looks demented   LABORATORY PANEL:   CBC  Recent Labs Lab 12/10/16 0901  WBC 11.9*  HGB 15.2  HCT 45.5  PLT 120*   ------------------------------------------------------------------------------------------------------------------ Chemistries   Recent Labs Lab 12/10/16 0901  12/12/16 0543  NA 139  < > 133*  K 3.0*  < > 3.5  CL 98*  < > 102  CO2 18*  < > 26  GLUCOSE 428*  < > 117*  BUN 27*  < > 26*  CREATININE 0.98  < > 0.87  CALCIUM 9.3  < > 8.6*  AST 40  --   --   ALT 25  --   --   ALKPHOS 61  --   --   BILITOT 2.2*  --   --   < > = values in this interval not displayed. RADIOLOGY:  No results found. ASSESSMENT AND PLAN:   Patient is a 83 year old white female presenting after being found on the floor   1. DKA,  Improved with insulin drip and aggressive IV fluids. Continue insulin lantus 16 units HS, metformin and SL. , novolog 5 units AC, following blood glucose levels very closely. Hemoglobin A1c was 11.5. It is unclear. Patient also family will be able to administer  insulin Lantus, may need to go to assisted living facility for medication management. Discussed with care management today.   2. Generalized weakness, Fall could be related to her being very dehydrated and DKA. Echocardiogram is pending, the patient was evaluated by  PT, skilled nursing facility was recommended, possible discharge next week, discussed with care management, obtaining social worker's consult.  3. Hypokalemia. Improved with potassium  supplement.  4. Accelerated hypertension. Continue IV hydralazine continue all her home regimen. Patient's blood pressure is low today, giving IV fluid bolus, suspending all blood pressure medications  5 Generalized weakness. PT evaluation is accomplished, skilled nursing facility was recommended, likely next week , obtaining social worker consult  All the records are reviewed and case discussed with Care Management/Social Worker. Management plans discussed with the patient, her daughter and they are in agreement.  CODE STATUS: DNR.  TOTAL TIME TAKING CARE OF THIS PATIENT: 35  minutes.    POSSIBLE D/C IN 1-2 DAYS, DEPENDING ON CLINICAL CONDITION.   Theodoro Grist M.D on 12/12/2016 at 2:04 PM  Between 7am to 6pm - Pager - [phone]  After 6pm go to [email or web address] - Proofreader  [facility]  Office  [phone]  CC: Primary care physician; PROVIDER NOT IN SYSTEM  Note: This dictation was prepared with Dragon dictation along with smaller phrase technology. Any transcriptional errors that result from this process are unintentional.

## 2016-12-12 NOTE — Progress Notes (Signed)
1300 - Patient has been sleepy and lethargic all day. Family states she was similar yesterday. Has to be wakened to eat and drink. BP trending down SBP 97-100. Dr. Clayton Bibles notified.

## 2016-12-12 NOTE — Progress Notes (Signed)
1330 - Dr. Clayton Bibles - bolus NS 513ml  and recheck bloodsugar - bloodsugar 153.

## 2016-12-13 LAB — GLUCOSE, CAPILLARY
GLUCOSE-CAPILLARY: 85 mg/dL (ref 65–99)
GLUCOSE-CAPILLARY: 146 mg/dL — AB (ref 65–99)
GLUCOSE-CAPILLARY: 113 mg/dL — AB (ref 65–99)
Glucose-Capillary: 184 mg/dL — ABNORMAL HIGH (ref 65–99)

## 2016-12-13 LAB — BASIC METABOLIC PANEL
ANION GAP: 5 (ref 5–15)
BUN: 31 mg/dL — ABNORMAL HIGH (ref 6–20)
CO2: 25 mmol/L (ref 22–32)
Calcium: 8.4 mg/dL — ABNORMAL LOW (ref 8.9–10.3)
Chloride: 103 mmol/L (ref 101–111)
Creatinine, Ser: 1.01 mg/dL — ABNORMAL HIGH (ref 0.44–1.00)
GFR calc Af Amer: 58 mL/min — ABNORMAL LOW (ref >60)
GFR calc non Af Amer: 50 mL/min — ABNORMAL LOW (ref >60)
GLUCOSE: 106 mg/dL — AB (ref 65–99)
POTASSIUM: 3.4 mmol/L — AB (ref 3.5–5.1)
SODIUM: 133 mmol/L — AB (ref 135–145)

## 2016-12-13 LAB — HEMOGLOBIN A1C
Hgb A1c MFr Bld: 11.6 % — ABNORMAL HIGH (ref 4.8–5.6)
MEAN PLASMA GLUCOSE: 286 mg/dL

## 2016-12-13 NOTE — Clinical Social Work Note (Signed)
Clinical Social Work Assessment  Patient Details  Name: Jody Dorsey MRN: [id number] Date of Birth: 1930/02/04  Date of referral:  12/13/16               Reason for consult:  Facility Placement                Permission sought to share information with:    Permission granted to share information::  Yes, Verbal Permission Granted  Name::        Agency::     Relationship::     Contact Information:     Housing/Transportation Living arrangements for the past 2 months:  Single Family Home Source of Information:  Adult Children Patient Interpreter Needed:  None Criminal Activity/Legal Involvement Pertinent to Current Situation/Hospitalization:  No - Comment as needed Significant Relationships:  Adult Children, [place], [employer] Lives with:  Self Do you feel safe going back to the place where you live?  Yes Need for family participation in patient care:  Yes (Comment)  Care giving concerns:  PT recommendation for STR   Social Worker assessment / plan:  CSW met with patient and her daughter and son-in-law at bedside to discuss dc planning. The patient's daughter gave verbal permission to conduct bed search and indicated that [facility] is the preference. The patient lives alone in a single family home next door to her daughter. The patient is independent with all ADLs at baseline, and she is able to ambulate with a walker. The patient is typically continent of both bladder and bowel.  CSW advised the family of Medicare guidelines for payment, and further advised that managed Medicare can be slightly different. The patient has used no Medicare days.  Employment status:  Retired Nurse, adult PT Recommendations:  [place] / Referral to community resources:  [place]  Patient/Family's Response to care:  Patient's family thanked CSW for assistance.  Patient/Family's Understanding of and Emotional Response to  Diagnosis, Current Treatment, and Prognosis:  Patient's family are in agreement with dc plan.  Emotional Assessment Appearance:  Appears stated age Attitude/Demeanor/Rapport:  Lethargic Affect (typically observed):  Withdrawn Orientation:  Oriented to Self Alcohol / Substance use:  Never Used Psych involvement (Current and /or in the community):  No (Comment)  Discharge Needs  Concerns to be addressed:  Care Coordination Readmission within the last 30 days:  No Current discharge risk:  None Barriers to Discharge:  Continued Medical Work up   [employer], LCSW 12/13/2016, 2:50 PM

## 2016-12-13 NOTE — Progress Notes (Signed)
[facility] NAME: Jody Dorsey    MR#:  [id number]  DATE OF BIRTH:  1930/06/10  SUBJECTIVE:  CHIEF COMPLAINT:   Chief Complaint  Patient presents with  . Altered Mental Status  . Fall   Patient has no complaints but demented.Lives alone, patient's daughter and son-in-law checks on her intermittently. Patient's blood pressure was low yesterday, she was rehydrated and she overall improved, denies any significant discomfort. Patient's family feel that she would benefit from skilled nursing facility placement for rehabilitation. REVIEW OF SYSTEMS:  Review of Systems  Constitutional: Negative for chills and fever.  HENT: Negative for congestion and tinnitus.   Eyes: Negative for blurred vision and double vision.  Respiratory: Negative for cough, shortness of breath and stridor.   Cardiovascular: Negative for chest pain and leg swelling.  Gastrointestinal: Negative for abdominal pain, blood in stool, constipation, diarrhea, melena, nausea and vomiting.  Genitourinary: Negative for dysuria, frequency and hematuria.  Musculoskeletal: Negative for back pain.  Neurological: Negative for dizziness, focal weakness, loss of consciousness and weakness.  Psychiatric/Behavioral: Negative for depression. The patient is not nervous/anxious.     DRUG ALLERGIES:   Allergies  Allergen Reactions  . Norvasc [Amlodipine Besylate]     Generic Norvasc-->cough  . Celebrex [Celecoxib] Rash    Hives, itching     VITALS:  Blood pressure (!) 116/56, pulse 66, temperature 97.6 F (36.4 C), temperature source Oral, resp. rate 20, height 5\' 4"  (1.626 m), weight 56.9 kg (125 lb 6.4 oz), SpO2 100 %. PHYSICAL EXAMINATION:  Physical Exam  Constitutional: She is well-developed, well-nourished, and in no distress.  HENT:  Head: Normocephalic.  Mouth/Throat: Oropharynx is clear and moist.  Eyes: Conjunctivae and EOM are normal.  Neck: Normal range of  motion. Neck supple. No JVD present. No tracheal deviation present.  Cardiovascular: Normal rate, regular rhythm and normal heart sounds.  Exam reveals no gallop.   No murmur heard. Pulmonary/Chest: Effort normal and breath sounds normal. No respiratory distress. She has no wheezes. She has no rales.  Abdominal: Soft. Bowel sounds are normal. She exhibits no distension. There is no tenderness.  Musculoskeletal: Normal range of motion. She exhibits no edema or tenderness.  Neurological: No cranial nerve deficit.  AAOx1-2  Skin: No rash noted. No erythema.  Psychiatric:  Looks demented   LABORATORY PANEL:   CBC  Recent Labs Lab 12/10/16 0901  WBC 11.9*  HGB 15.2  HCT 45.5  PLT 120*   ------------------------------------------------------------------------------------------------------------------ Chemistries   Recent Labs Lab 12/10/16 0901  12/13/16 0444  NA 139  < > 133*  K 3.0*  < > 3.4*  CL 98*  < > 103  CO2 18*  < > 25  GLUCOSE 428*  < > 106*  BUN 27*  < > 31*  CREATININE 0.98  < > 1.01*  CALCIUM 9.3  < > 8.4*  AST 40  --   --   ALT 25  --   --   ALKPHOS 61  --   --   BILITOT 2.2*  --   --   < > = values in this interval not displayed. RADIOLOGY:  No results found. ASSESSMENT AND PLAN:   Patient is a 83 year old white female presenting after being found on the floor   1. DKA,  Improved with insulin drip and aggressive IV fluids. Continue insulin lantus 16 units HS, metformin and SL. , novolog 5 units AC, following blood glucose levels very  closely. Hemoglobin A1c was 11.5. Patient's family feels, that I insulin could be administered by a son-in-law daily, however, wish patient to be placed to skilled nursing facility for rehabilitation first before coming home.   2. Generalized weakness, Fall could be related to her being very dehydrated and DKA. Echocardiogram is pending, the patient was evaluated by  PT, skilled nursing facility was recommended,  discharge next week, discussed with care management, obtaining social worker's consult.  3. Hypokalemia. Improved with potassium supplement. Check potassium tomorrow  4. Accelerated hypertension, hypotensive yesterday. Holding Norvasc, continue all her other medications. Watch oral intake closely, advanced good fluid intake  5 Generalized weakness. PT evaluation is accomplished, skilled nursing facility was recommended, likely next week , obtaining social worker consult  All the records are reviewed and case discussed with Care Management/Social Worker. Management plans discussed with the patient, her daughter and they are in agreement.  CODE STATUS: DNR.  TOTAL TIME TAKING CARE OF THIS PATIENT: 35  minutes.    POSSIBLE D/C IN 1-2 DAYS, DEPENDING ON CLINICAL CONDITION.   Theodoro Grist M.D on 12/13/2016 at 3:08 PM  Between 7am to 6pm - Pager - [phone]  After 6pm go to [email or web address] - Proofreader  [facility]  Office  [phone]  CC: Primary care physician; PROVIDER NOT IN SYSTEM  Note: This dictation was prepared with Dragon dictation along with smaller phrase technology. Any transcriptional errors that result from this process are unintentional.

## 2016-12-14 ENCOUNTER — Encounter
Admission: RE | Admit: 2016-12-14 | Discharge: 2016-12-14 | Disposition: A | Discharge status: Home / Self Care | Payer: Medicare Other | Source: Ambulatory Visit | Attending: Internal Medicine | Admitting: Internal Medicine

## 2016-12-14 DIAGNOSIS — E876 Hypokalemia: Secondary | ICD-10-CM

## 2016-12-14 DIAGNOSIS — D696 Thrombocytopenia, unspecified: Secondary | ICD-10-CM

## 2016-12-14 DIAGNOSIS — E871 Hypo-osmolality and hyponatremia: Secondary | ICD-10-CM

## 2016-12-14 DIAGNOSIS — R05 Cough: Secondary | ICD-10-CM | POA: Insufficient documentation

## 2016-12-14 DIAGNOSIS — E119 Type 2 diabetes mellitus without complications: Secondary | ICD-10-CM | POA: Insufficient documentation

## 2016-12-14 DIAGNOSIS — N289 Disorder of kidney and ureter, unspecified: Secondary | ICD-10-CM

## 2016-12-14 DIAGNOSIS — I10 Essential (primary) hypertension: Secondary | ICD-10-CM

## 2016-12-14 DIAGNOSIS — R531 Weakness: Secondary | ICD-10-CM

## 2016-12-14 LAB — GLUCOSE, CAPILLARY
GLUCOSE-CAPILLARY: 89 mg/dL (ref 65–99)
Glucose-Capillary: 102 mg/dL — ABNORMAL HIGH (ref 65–99)
Glucose-Capillary: 104 mg/dL — ABNORMAL HIGH (ref 65–99)

## 2016-12-14 LAB — BASIC METABOLIC PANEL
ANION GAP: 4 — AB (ref 5–15)
BUN: 32 mg/dL — ABNORMAL HIGH (ref 6–20)
CALCIUM: 8.5 mg/dL — AB (ref 8.9–10.3)
CO2: 26 mmol/L (ref 22–32)
Chloride: 103 mmol/L (ref 101–111)
Creatinine, Ser: 0.87 mg/dL (ref 0.44–1.00)
GFR calc non Af Amer: 59 mL/min — ABNORMAL LOW (ref >60)
Glucose, Bld: 89 mg/dL (ref 65–99)
Potassium: 3.6 mmol/L (ref 3.5–5.1)
SODIUM: 133 mmol/L — AB (ref 135–145)

## 2016-12-14 LAB — CBC
HCT: 38 % (ref 35.0–47.0)
HEMOGLOBIN: 12.7 g/dL (ref 12.0–16.0)
MCH: 31.2 pg (ref 26.0–34.0)
MCHC: 33.4 g/dL (ref 32.0–36.0)
MCV: 93.5 fL (ref 80.0–100.0)
Platelets: 131 10*3/uL — ABNORMAL LOW (ref 150–440)
RBC: 4.06 MIL/uL (ref 3.80–5.20)
RDW: 15.4 % — ABNORMAL HIGH (ref 11.5–14.5)
WBC: 4.1 10*3/uL (ref 3.6–11.0)

## 2016-12-14 MED ORDER — TRAMADOL HCL 50 MG PO TABS: 100.0000 mg | ORAL_TABLET | Freq: Four times a day (QID) | ORAL | 0 refills | Status: DC | PRN

## 2016-12-14 MED ORDER — INSULIN ASPART 100 UNIT/ML ~~LOC~~ SOLN: 5.0000 [IU] | Freq: Three times a day (TID) | SUBCUTANEOUS | 11 refills | Status: DC

## 2016-12-14 MED ORDER — INSULIN GLARGINE 100 UNIT/ML ~~LOC~~ SOLN: 16.0000 [IU] | Freq: Every day | SUBCUTANEOUS | 11 refills | Status: DC

## 2016-12-14 NOTE — Plan of Care (Signed)
MD making rounds. Order received to discharge to facility. IV removed. CSW facilitating transfer to facility. Report called to [facility].Transported via EMS. Belongings sent home with family.

## 2016-12-14 NOTE — NC FL2 (Signed)
[place] LEVEL OF CARE SCREENING TOOL     IDENTIFICATION  Patient Name: Jody Dorsey Birthdate: 1930/01/25 Sex: female Admission Date (Current Location): 12/10/2016  [place] and [place] Number:      Facility and Address:  [facility], [address], [place], [place] [address]      Provider Number: [id number]  Attending Physician Name and Address:  Theodoro Grist, MD  Relative Name and Phone Number:       Current Level of Care: Hospital Recommended Level of Care: [place] Prior Approval Number:    Date Approved/Denied: 12/12/16 PASRR Number: [id number]  Discharge Plan: SNF    Current Diagnoses: Patient Active Problem List   Diagnosis Date Noted  . Generalized weakness 12/14/2016  . Hypokalemia 12/14/2016  . Hyponatremia 12/14/2016  . Accelerated hypertension 12/14/2016  . Acute renal insufficiency 12/14/2016  . Thrombocytopenia ([place]) 12/14/2016  . DKA (diabetic ketoacidoses) ([place]) 12/10/2016  . Pressure injury of skin 12/10/2016  . Closed head injury with petechial brain hemorrhage ([place]) 11/30/2012  . SDH (subdural hematoma) ([place]) 11/30/2012  . Diabetes mellitus ([place]) 11/30/2012  . Laceration 11/30/2012    Orientation RESPIRATION BLADDER Height & Weight     Self, Time  Normal Continent Weight: 125 lb 6.4 oz (56.9 kg) Height:  5\' 4"  (162.6 cm)  BEHAVIORAL SYMPTOMS/MOOD NEUROLOGICAL BOWEL NUTRITION STATUS      Continent Diet (Diabetes diet)  AMBULATORY STATUS COMMUNICATION OF NEEDS Skin   Extensive Assist Verbally Normal                       Personal Care Assistance Level of Assistance  Bathing, Dressing, Feeding Bathing Assistance: Limited assistance Feeding assistance: Independent Dressing Assistance: Limited assistance     Functional Limitations Info             SPECIAL CARE FACTORS FREQUENCY  PT (By licensed PT), OT (By licensed OT)     PT Frequency: Up to 5X per day, 5 days  per week OT Frequency: Up to 5X per day, 5 days per week            Contractures Contractures Info: Present    Additional Factors Info  Code Status, Allergies Code Status Info: DNR Allergies Info: Norvasc Amlodipine Besylate, Celebrex Celecoxib           Current Medications (12/14/2016):  This is the current hospital active medication list Current Facility-Administered Medications  Medication Dose Route Frequency Provider Last Rate Last Dose  . acetaminophen (TYLENOL) tablet 650 mg  650 mg Oral Q6H PRN Dustin Flock, MD      . aspirin EC tablet 81 mg  81 mg Oral Daily Dustin Flock, MD   81 mg at 12/14/16 0854  . cloNIDine (CATAPRES) tablet 0.1 mg  0.1 mg Oral BID Dustin Flock, MD   0.1 mg at 12/14/16 0853  . enoxaparin (LOVENOX) injection 40 mg  40 mg Subcutaneous Q24H Dustin Flock, MD   40 mg at 12/13/16 2144  . hydrALAZINE (APRESOLINE) injection 10 mg  10 mg Intravenous Q6H PRN Dustin Flock, MD   10 mg at 12/11/16 [id number]  . insulin aspart (novoLOG) injection 0-5 Units  0-5 Units Subcutaneous QHS Alexis Hugelmeyer, DO   0 Units at 12/13/16 2146  . insulin aspart (novoLOG) injection 0-9 Units  0-9 Units Subcutaneous TID WC Alexis Hugelmeyer, DO   1 Units at 12/13/16 1711  . insulin aspart (novoLOG) injection 5 Units  5 Units Subcutaneous TID WC  Theodoro Grist, MD   5 Units at 12/14/16 [id number]  . insulin glargine (LANTUS) injection 16 Units  16 Units Subcutaneous QHS Theodoro Grist, MD   16 Units at 12/13/16 2145  . isosorbide mononitrate (IMDUR) 24 hr tablet 30 mg  30 mg Oral Daily Dustin Flock, MD   30 mg at 12/14/16 0854  . latanoprost (XALATAN) 0.005 % ophthalmic solution 1 drop  1 drop Both Eyes QHS Dustin Flock, MD   1 drop at 12/13/16 2146  . levothyroxine (SYNTHROID, LEVOTHROID) tablet 50 mcg  50 mcg Oral QAC breakfast Dustin Flock, MD   50 mcg at 12/14/16 0853  . metFORMIN (GLUCOPHAGE) tablet 500 mg  500 mg Oral BID WC Dustin Flock, MD   500 mg at 12/14/16 0853  .  metoprolol succinate (TOPROL-XL) 24 hr tablet 100 mg  100 mg Oral BID Dustin Flock, MD   100 mg at 12/14/16 0854  . nitroGLYCERIN (NITROSTAT) SL tablet 0.4 mg  0.4 mg Sublingual Q5 min PRN Dustin Flock, MD      . rosuvastatin (CRESTOR) tablet 5 mg  5 mg Oral Q M,W,F Dustin Flock, MD   5 mg at 12/14/16 0853  . senna-docusate (Senokot-S) tablet 1 tablet  1 tablet Oral BID PRN Dustin Flock, MD      . traMADol Veatrice Bourbon) tablet 100 mg  100 mg Oral Q6H PRN Dustin Flock, MD         Discharge Medications: Please see discharge summary for a list of discharge medications.  Relevant Imaging Results:  Relevant Lab Results:   Additional Information SS# [id number]  Tyrea Froberg, Veronia Beets, [place]

## 2016-12-14 NOTE — Clinical Social Work Placement (Signed)
   CLINICAL SOCIAL WORK PLACEMENT  NOTE  Date:  12/14/2016  Patient Details  Name: Jody Dorsey MRN: [id number] Date of Birth: 1930/06/01  Clinical Social Work is seeking post-discharge placement for this patient at the [place] level of care (*CSW will initial, date and re-position this form in  chart as items are completed):  Yes   Patient/family provided with [facility] Work Department's list of facilities offering this level of care within the geographic area requested by the patient (or if unable, by the patient's family).  Yes   Patient/family informed of their freedom to choose among providers that offer the needed level of care, that participate in Medicare, Medicaid or managed care program needed by the patient, have an available bed and are willing to accept the patient.  Yes   Patient/family informed of [facility]'s ownership interest in [facility], as well as of the fact that they are under no obligation to receive care at these facilities.  PASRR submitted to EDS on 12/12/16     PASRR number received on 12/12/16     Existing PASRR number confirmed on       FL2 transmitted to all facilities in geographic area requested by pt/family on 12/12/16     FL2 transmitted to all facilities within larger geographic area on       Patient informed that his/her managed care company has contracts with or will negotiate with certain facilities, including the following:        Yes   Patient/family informed of bed offers received.  Patient chooses bed at  [facility] )     Physician recommends and patient chooses bed at      Patient to be transferred to  [facility] ) on 12/14/16.  Patient to be transferred to facility by  [facility] )     Patient family notified on 12/14/16 of transfer.  Name of family member notified:   (Patient's daughter Santiago Glad is at bedside and awre of D/C today. )      PHYSICIAN       Additional Comment:    _______________________________________________ Tonae Livolsi, Veronia Beets, LCSW 12/14/2016, 12:42 PM

## 2016-12-14 NOTE — Discharge Summary (Signed)
[facility] NAME: Jody Dorsey    MR#:  [id number]  DATE OF BIRTH:  [birth year]  DATE OF ADMISSION:  12/10/2016 ADMITTING PHYSICIAN: Dustin Flock, MD  DATE OF DISCHARGE: 12/14/2016  PRIMARY CARE PHYSICIAN: PROVIDER NOT IN SYSTEM     ADMISSION DIAGNOSIS:  Dehydration [E86.0] Hyperglycemia [R73.9] Abnormal CK [R74.8] Elevated troponin I level [R74.8] Fall, initial encounter [W19.XXXA] Altered mental status, unspecified altered mental status type [R41.82]  DISCHARGE DIAGNOSIS:  Principal Problem:   DKA (diabetic ketoacidoses) ([place]) Active Problems:   Generalized weakness   Hypokalemia   Hyponatremia   Accelerated hypertension   Pressure injury of skin   Acute renal insufficiency   Thrombocytopenia (H[facility])   SECONDARY DIAGNOSIS:   Past Medical History:  Diagnosis Date  . CAD (coronary artery disease)    status post 2 stents (Dr. Claiborne Billings, Dr. Elisabeth Cara, Dr. Fath-cardiologist) 2008  . DM (diabetes mellitus) ([place])   . Glaucoma   . HTN (hypertension)     .pro HOSPITAL COURSE:   The patient is 83 year old Caucasian female with past medical history significant for diabetes mellitus, coronary artery disease, glaucoma, essential hypertension, who presents to the hospital after she was found on the floor by family member. When EMS arrived, her blood glucose level was found to be 500. She was brought to emergency room and she had elevated anion gap, DKA. She was admitted to the hospital for insulin IV infusion, IV fluids. Patient denied any nausea, vomiting or diarrhea. CT of the head done in the emergency room revealed progressive ventriculomegaly, cerebral atrophy, chest x-ray showed no acute disease. Her labs revealed hypokalemia, acidosis, elevated troponin, elevated white blood cell count. Patient was admitted to the hospital for IV insulin, IV fluids, and improved clinically. Her diabetic regimen was changed to insulin  Lantus and NovoLog. Hemoglobin A1c was checked, was found to be high at 11.5. Patient was rehydrated and improved. She was seen by physical therapist and recommended skilled nursing facility placement, where she will become today. While in the hospital patient was noted to have elevated blood pressure, requiring additional medications to control it. Discussion by problem: 1. DKA,  Improved with insulin drip and aggressive IV fluids. Continue insulin lantus 16 units HS, metformin and sliding scale insulin. , novolog 5 units AC, following blood glucose levels very closely. Hemoglobin A1c was 11.5. Patient's family feels, that insulin could be administered by a son-in-law daily, however, wish patient to be placed to skilled nursing facility for rehabilitation first before coming home. Blood glucose levels ranging between 85-180.   2. Generalized weakness, fall ,  related to the patient being very dehydrated and DKA. Echocardiogram was not performed during this admission, patient may benefit from echo as well as carotid ultrasound as outpatient, the patient was evaluated by  PT, skilled nursing facility was recommended, discharge today  3. Hypokalemia. Resolved with potassium supplementation. Check potassium , intermittent as outpatient since patient is on ACE inhibitor  4. Accelerated hypertension, continue all patient's medications,  Watch oral intake closely, advance fluid intake  5 Generalized weakness. PT evaluation was accomplished, skilled nursing facility was recommended, discharging today  6. Hyponatremia, seems to be stable and chronic, follow-up as outpatient, could be related to HCTZ  7. Thrombocytopenia, stable  8. Ventriculomegaly, likely ex vacuo , normal pressure hydrocephalus, may benefit from Diamox, needs physical therapy, may benefit her neurologist evaluation as outpatient  DISCHARGE CONDITIONS:   Stable  CONSULTS OBTAINED:  DRUG ALLERGIES:   Allergies  Allergen  Reactions  . Norvasc [Amlodipine Besylate]     Generic Norvasc-->cough  . Celebrex [Celecoxib] Rash    Hives, itching      DISCHARGE MEDICATIONS:   Current Discharge Medication List    START taking these medications   Details  insulin aspart (NOVOLOG) 100 UNIT/ML injection Inject 5 Units into the skin 3 (three) times daily with meals. And sliding scale insulin according to the scale, thank you Qty: 10 vial, Refills: 11    insulin glargine (LANTUS) 100 UNIT/ML injection Inject 0.16 mLs (16 Units total) into the skin at bedtime. Qty: 10 mL, Refills: 11    traMADol (ULTRAM) 50 MG tablet Take 2 tablets (100 mg total) by mouth every 6 (six) hours as needed for moderate pain. Qty: 30 tablet, Refills: 0      CONTINUE these medications which have NOT CHANGED   Details  acetaminophen (TYLENOL) 325 MG tablet Take 650 mg by mouth every 6 (six) hours as needed.    amLODipine (NORVASC) 5 MG tablet Take 5 mg by mouth daily.     cloNIDine (CATAPRES) 0.1 MG tablet Take 0.1 mg by mouth 2 (two) times daily.    isosorbide mononitrate (IMDUR) 30 MG 24 hr tablet TAKE 1 TABLET DAILY Qty: 90 tablet, Refills: 0    latanoprost (XALATAN) 0.005 % ophthalmic solution Place 1 drop into both eyes at bedtime.     metFORMIN (GLUCOPHAGE) 500 MG tablet Take 500 mg by mouth 2 (two) times daily with a meal.    metoprolol succinate (TOPROL-XL) 100 MG 24 hr tablet Take 100 mg by mouth 2 (two) times daily. Take with or immediately following a meal.    nitroGLYCERIN (NITROSTAT) 0.4 MG SL tablet Place 0.4 mg under the tongue every 5 (five) minutes as needed.    olmesartan-hydrochlorothiazide (BENICAR HCT) 40-25 MG tablet Take 1 tablet by mouth daily.    senna-docusate (SENOKOT-S) 8.6-50 MG per tablet Take 1 tablet by mouth 2 (two) times daily as needed for constipation. Qty: 60 tablet, Refills: 0    aspirin 81 MG tablet Take 1 tablet (81 mg total) by mouth daily. Start on 12/07/2012 Qty: 30 tablet     levothyroxine (SYNTHROID, LEVOTHROID) 50 MCG tablet Take 50 mcg by mouth daily.    Menthol-Methyl Salicylate (MUSCLE RUB) 10-15 % CREA Apply 1 application topically 2 (two) times daily as needed. Qty: 1 Tube, Refills: 0    polymixin-bacitracin (POLYSPORIN) 500-10000 UNIT/GM OINT ointment Apply 1 application topically 2 (two) times daily. Qty: 1 Tube, Refills: 0    potassium chloride (KLOR-CON) 8 MEQ tablet Take 8 mEq by mouth 2 (two) times daily.    rosuvastatin (CRESTOR) 5 MG tablet Take 5 mg by mouth every Monday, Wednesday, and Friday.      STOP taking these medications     exenatide (BYETTA) 10 MCG/0.04ML SOPN injection      glyBURIDE micronized (GLYNASE) 6 MG tablet      traMADol (ULTRAM-ER) 200 MG 24 hr tablet          DISCHARGE INSTRUCTIONS:    The patient is to follow-up with primary care physician, neurologist as outpatient  If you experience worsening of your admission symptoms, develop shortness of breath, life threatening emergency, suicidal or homicidal thoughts you must seek medical attention immediately by calling 911 or calling your MD immediately  if symptoms less severe.  You Must read complete instructions/literature along with all the possible adverse reactions/side effects for all the Medicines  you take and that have been prescribed to you. Take any new Medicines after you have completely understood and accept all the possible adverse reactions/side effects.   Please note  You were cared for by a hospitalist during your hospital stay. If you have any questions about your discharge medications or the care you received while you were in the hospital after you are discharged, you can call the unit and asked to speak with the hospitalist on call if the hospitalist that took care of you is not available. Once you are discharged, your primary care physician will handle any further medical issues. Please note that NO REFILLS for any discharge medications will be  authorized once you are discharged, as it is imperative that you return to your primary care physician (or establish a relationship with a primary care physician if you do not have one) for your aftercare needs so that they can reassess your need for medications and monitor your lab values.    Today   CHIEF COMPLAINT:   Chief Complaint  Patient presents with  . Altered Mental Status  . Fall    HISTORY OF PRESENT ILLNESS:  Jody Dorsey  is a 83 y.o. female with a known history of diabetes mellitus, coronary artery disease, glaucoma, essential hypertension, who presents to the hospital after she was found on the floor by family member. When EMS arrived, her blood glucose level was found to be 500. She was brought to emergency room and she had elevated anion gap, DKA. She was admitted to the hospital for insulin IV infusion, IV fluids. Patient denied any nausea, vomiting or diarrhea. CT of the head done in the emergency room revealed progressive ventriculomegaly, cerebral atrophy, chest x-ray showed no acute disease. Her labs revealed hypokalemia, acidosis, elevated troponin, elevated white blood cell count. Patient was admitted to the hospital for IV insulin, IV fluids, and improved clinically. Her diabetic regimen was changed to insulin Lantus and NovoLog. Hemoglobin A1c was checked, was found to be high at 11.5. Patient was rehydrated and improved. She was seen by physical therapist and recommended skilled nursing facility placement, where she will become today. While in the hospital patient was noted to have elevated blood pressure, requiring additional medications to control it. Discussion by problem: 1. DKA,  Improved with insulin drip and aggressive IV fluids. Continue insulin lantus 16 units HS, metformin and sliding scale insulin. , novolog 5 units AC, following blood glucose levels very closely. Hemoglobin A1c was 11.5. Patient's family feels, that insulin could be administered by a  son-in-law daily, however, wish patient to be placed to skilled nursing facility for rehabilitation first before coming home. Blood glucose levels ranging between 85-180.   2. Generalized weakness, fall ,  related to the patient being very dehydrated and DKA. Echocardiogram was not performed during this admission, patient may benefit from echo as well as carotid ultrasound as outpatient, the patient was evaluated by  PT, skilled nursing facility was recommended, discharge today  3. Hypokalemia. Resolved with potassium supplementation. Check potassium , intermittent as outpatient since patient is on ACE inhibitor  4. Accelerated hypertension, continue all patient's medications,  Watch oral intake closely, advance fluid intake  5 Generalized weakness. PT evaluation was accomplished, skilled nursing facility was recommended, discharging today  6. Hyponatremia, seems to be stable and chronic, follow-up as outpatient, could be related to HCTZ  7. Thrombocytopenia, stable  8. Ventriculomegaly, likely ex vacuo , normal pressure hydrocephalus, may benefit from Diamox, needs physical therapy, may  benefit her neurologist evaluation as outpatient    VITAL SIGNS:  Blood pressure (!) 149/77, pulse 64, temperature 97.5 F (36.4 C), temperature source Oral, resp. rate 16, height 5\' 4"  (1.626 m), weight 56.9 kg (125 lb 6.4 oz), SpO2 99 %.  I/O:   Intake/Output Summary (Last 24 hours) at 12/14/16 0909 Last data filed at 12/13/16 1401  Gross per 24 hour  Intake              480 ml  Output                0 ml  Net              480 ml    PHYSICAL EXAMINATION:  GENERAL:  83 y.o.-year-old patient lying in the bed with no acute distress.  EYES: Pupils equal, round, reactive to light and accommodation. No scleral icterus. Extraocular muscles intact.  HEENT: Head atraumatic, normocephalic. Oropharynx and nasopharynx clear.  NECK:  Supple, no jugular venous distention. No thyroid enlargement, no  tenderness.  LUNGS: Normal breath sounds bilaterally, no wheezing, rales,rhonchi or crepitation. No use of accessory muscles of respiration.  CARDIOVASCULAR: S1, S2 normal. No murmurs, rubs, or gallops.  ABDOMEN: Soft, non-tender, non-distended. Bowel sounds present. No organomegaly or mass.  EXTREMITIES: No pedal edema, cyanosis, or clubbing.  NEUROLOGIC: Cranial nerves II through XII are intact. Muscle strength 5/5 in all extremities. Sensation intact. Gait not checked.  PSYCHIATRIC: The patient is alert and oriented x 3.  SKIN: No obvious rash, lesion, or ulcer.   DATA REVIEW:   CBC  Recent Labs Lab 12/14/16 0428  WBC 4.1  HGB 12.7  HCT 38.0  PLT 131*    Chemistries   Recent Labs Lab 12/10/16 0901  12/14/16 0428  NA 139  < > 133*  K 3.0*  < > 3.6  CL 98*  < > 103  CO2 18*  < > 26  GLUCOSE 428*  < > 89  BUN 27*  < > 32*  CREATININE 0.98  < > 0.87  CALCIUM 9.3  < > 8.5*  AST 40  --   --   ALT 25  --   --   ALKPHOS 61  --   --   BILITOT 2.2*  --   --   < > = values in this interval not displayed.  Cardiac Enzymes  Recent Labs Lab 12/10/16 0901  TROPONINI 0.04*    Microbiology Results  Results for orders placed or performed during the hospital encounter of 12/10/16  MRSA culture     Status: None   Collection Time: 12/10/16  3:50 PM  Result Value Ref Range Status   Specimen Description Body Fluid  Final   Special Requests NONE  Final   Culture   Final    NO MRSA DETECTED Performed at [facility], [address] [place], [place] [address]    Report Status 12/12/2016 FINAL  Final    RADIOLOGY:  No results found.  EKG:   Orders placed or performed during the hospital encounter of 12/10/16  . ED EKG  . ED EKG  . EKG 12-Lead  . EKG 12-Lead      Management plans discussed with the patient, family and they are in agreement.  CODE STATUS:     Code Status Orders        Start     Ordered   12/10/16 2016  Do not attempt resuscitation  (DNR)  Continuous  Question Answer Comment  In the event of cardiac or respiratory ARREST Do not call a "code blue"   In the event of cardiac or respiratory ARREST Do not perform Intubation, CPR, defibrillation or ACLS   In the event of cardiac or respiratory ARREST Use medication by any route, position, wound care, and other measures to relive pain and suffering. May use oxygen, suction and manual treatment of airway obstruction as needed for comfort.      12/10/16 2015    Code Status History    Date Active Date Inactive Code Status Order ID Comments User Context   12/10/2016  4:57 PM 12/10/2016  8:15 PM Full Code [id number]  Dustin Flock, MD Inpatient   11/30/2012 10:08 PM 12/04/2012  7:42 PM DNR [id number]  Dion Body, RN Inpatient    Advance Directive Documentation   Flowsheet Row Most Recent Value  Type of Advance Directive  Healthcare Power of Attorney, Living will, Out of facility DNR (pink MOST or yellow form)  Pre-existing out of facility DNR order (yellow form or pink MOST form)  No data  "MOST" Form in Place?  No data      TOTAL TIME TAKING CARE OF THIS PATIENT: 40 minutes.    Theodoro Grist M.D on 12/14/2016 at 9:09 AM  Between 7am to 6pm - Pager - [phone]  After 6pm go to [email or web address] - password [email or web address] [facility]  Office  [phone]  CC: Primary care physician; PROVIDER NOT IN SYSTEM

## 2016-12-14 NOTE — Progress Notes (Signed)
Clinical Social Worker (CSW) presented bed offers to patient's daughter Santiago Glad at bedside. [facility] liaison spoke with Santiago Glad and made her aware that they cannot accept her because of an open insurance claim with [place]. Daughter took care of the claim and stated that medicare will have it off their system in 24-48 hours.   Daughter chose [employer] Per [facility] admissions coordinator at [facility] patient can come today to room 202-B. RN will call report at [phone] and arrange EMS for transport. CSW sent D/C order to [facility] via [place]. Please reconsult if future social work needs arise. CSW signing off.   [employer], LCSW [phone]

## 2016-12-14 NOTE — Care Management Important Message (Signed)
Important Message  Patient Details  Name: Jody Dorsey MRN: [id number] Date of Birth: Jul 01, 1930   Medicare Important Message Given:  Yes    Shelbie Ammons, RN 12/14/2016, 11:25 AM

## 2016-12-15 DIAGNOSIS — E871 Hypo-osmolality and hyponatremia: Secondary | ICD-10-CM | POA: Diagnosis not present

## 2016-12-15 DIAGNOSIS — E119 Type 2 diabetes mellitus without complications: Secondary | ICD-10-CM | POA: Diagnosis not present

## 2016-12-15 DIAGNOSIS — R05 Cough: Secondary | ICD-10-CM | POA: Diagnosis not present

## 2016-12-15 LAB — GLUCOSE, CAPILLARY: Glucose-Capillary: 209 mg/dL — ABNORMAL HIGH (ref 65–99)

## 2016-12-16 DIAGNOSIS — E871 Hypo-osmolality and hyponatremia: Secondary | ICD-10-CM | POA: Diagnosis not present

## 2016-12-16 LAB — GLUCOSE, CAPILLARY
GLUCOSE-CAPILLARY: 148 mg/dL — AB (ref 65–99)
Glucose-Capillary: 252 mg/dL — ABNORMAL HIGH (ref 65–99)

## 2016-12-17 DIAGNOSIS — E871 Hypo-osmolality and hyponatremia: Secondary | ICD-10-CM | POA: Diagnosis not present

## 2016-12-17 LAB — GLUCOSE, CAPILLARY
GLUCOSE-CAPILLARY: 426 mg/dL — AB (ref 65–99)
Glucose-Capillary: 265 mg/dL — ABNORMAL HIGH (ref 65–99)
Glucose-Capillary: 358 mg/dL — ABNORMAL HIGH (ref 65–99)
Glucose-Capillary: 417 mg/dL — ABNORMAL HIGH (ref 65–99)

## 2016-12-18 DIAGNOSIS — E871 Hypo-osmolality and hyponatremia: Secondary | ICD-10-CM | POA: Diagnosis not present

## 2016-12-18 LAB — GLUCOSE, CAPILLARY
GLUCOSE-CAPILLARY: 141 mg/dL — AB (ref 65–99)
GLUCOSE-CAPILLARY: 382 mg/dL — AB (ref 65–99)
GLUCOSE-CAPILLARY: 304 mg/dL — AB (ref 65–99)
GLUCOSE-CAPILLARY: 159 mg/dL — AB (ref 65–99)
Glucose-Capillary: 102 mg/dL — ABNORMAL HIGH (ref 65–99)
Glucose-Capillary: 167 mg/dL — ABNORMAL HIGH (ref 65–99)
Glucose-Capillary: 309 mg/dL — ABNORMAL HIGH (ref 65–99)

## 2016-12-21 DIAGNOSIS — E871 Hypo-osmolality and hyponatremia: Secondary | ICD-10-CM | POA: Diagnosis not present

## 2016-12-21 LAB — GLUCOSE, CAPILLARY
GLUCOSE-CAPILLARY: 113 mg/dL — AB (ref 65–99)
GLUCOSE-CAPILLARY: 113 mg/dL — AB (ref 65–99)
GLUCOSE-CAPILLARY: 220 mg/dL — AB (ref 65–99)
GLUCOSE-CAPILLARY: 240 mg/dL — AB (ref 65–99)
GLUCOSE-CAPILLARY: 246 mg/dL — AB (ref 65–99)
GLUCOSE-CAPILLARY: 248 mg/dL — AB (ref 65–99)
GLUCOSE-CAPILLARY: 308 mg/dL — AB (ref 65–99)
GLUCOSE-CAPILLARY: 63 mg/dL — AB (ref 65–99)
Glucose-Capillary: 158 mg/dL — ABNORMAL HIGH (ref 65–99)
Glucose-Capillary: 344 mg/dL — ABNORMAL HIGH (ref 65–99)
Glucose-Capillary: 346 mg/dL — ABNORMAL HIGH (ref 65–99)
Glucose-Capillary: 102 mg/dL — ABNORMAL HIGH (ref 65–99)
Glucose-Capillary: 193 mg/dL — ABNORMAL HIGH (ref 65–99)
Glucose-Capillary: 250 mg/dL — ABNORMAL HIGH (ref 65–99)
Glucose-Capillary: 301 mg/dL — ABNORMAL HIGH (ref 65–99)
Glucose-Capillary: 45 mg/dL — ABNORMAL LOW (ref 65–99)
Glucose-Capillary: 83 mg/dL (ref 65–99)

## 2016-12-22 DIAGNOSIS — E871 Hypo-osmolality and hyponatremia: Secondary | ICD-10-CM | POA: Diagnosis not present

## 2016-12-22 LAB — GLUCOSE, CAPILLARY
GLUCOSE-CAPILLARY: 250 mg/dL — AB (ref 65–99)
Glucose-Capillary: 363 mg/dL — ABNORMAL HIGH (ref 65–99)

## 2016-12-23 LAB — GLUCOSE, CAPILLARY
GLUCOSE-CAPILLARY: 322 mg/dL — AB (ref 65–99)
Glucose-Capillary: 166 mg/dL — ABNORMAL HIGH (ref 65–99)
Glucose-Capillary: 300 mg/dL — ABNORMAL HIGH (ref 65–99)
Glucose-Capillary: 335 mg/dL — ABNORMAL HIGH (ref 65–99)
Glucose-Capillary: 230 mg/dL — ABNORMAL HIGH (ref 65–99)

## 2016-12-24 ENCOUNTER — Non-Acute Institutional Stay (SKILLED_NURSING_FACILITY): Payer: Medicare Other | Admitting: Gerontology

## 2016-12-24 ENCOUNTER — Other Ambulatory Visit
Admission: RE | Admit: 2016-12-24 | Discharge: 2016-12-24 | Disposition: A | Discharge status: Home / Self Care | Payer: Medicare Other | Source: Ambulatory Visit | Attending: Gerontology | Admitting: Gerontology

## 2016-12-24 ENCOUNTER — Other Ambulatory Visit: Payer: Self-pay | Admitting: Gerontology

## 2016-12-24 DIAGNOSIS — R0989 Other specified symptoms and signs involving the circulatory and respiratory systems: Secondary | ICD-10-CM | POA: Insufficient documentation

## 2016-12-24 DIAGNOSIS — R1312 Dysphagia, oropharyngeal phase: Secondary | ICD-10-CM

## 2016-12-24 DIAGNOSIS — R05 Cough: Secondary | ICD-10-CM

## 2016-12-24 DIAGNOSIS — R531 Weakness: Secondary | ICD-10-CM

## 2016-12-24 DIAGNOSIS — R739 Hyperglycemia, unspecified: Secondary | ICD-10-CM | POA: Diagnosis not present

## 2016-12-24 DIAGNOSIS — E871 Hypo-osmolality and hyponatremia: Secondary | ICD-10-CM

## 2016-12-24 DIAGNOSIS — R5381 Other malaise: Secondary | ICD-10-CM | POA: Insufficient documentation

## 2016-12-24 DIAGNOSIS — R51 Headache: Secondary | ICD-10-CM | POA: Diagnosis not present

## 2016-12-24 DIAGNOSIS — R059 Cough, unspecified: Secondary | ICD-10-CM

## 2016-12-24 LAB — GLUCOSE, CAPILLARY
GLUCOSE-CAPILLARY: 190 mg/dL — AB (ref 65–99)
GLUCOSE-CAPILLARY: 251 mg/dL — AB (ref 65–99)
GLUCOSE-CAPILLARY: 200 mg/dL — AB (ref 65–99)
Glucose-Capillary: 338 mg/dL — ABNORMAL HIGH (ref 65–99)
Glucose-Capillary: 285 mg/dL — ABNORMAL HIGH (ref 65–99)

## 2016-12-24 LAB — CBC WITH DIFFERENTIAL/PLATELET
Basophils Absolute: 0 10*3/uL (ref 0–0.1)
Basophils Relative: 0 %
EOS ABS: 0 10*3/uL (ref 0–0.7)
EOS PCT: 0 %
HCT: 32.7 % — ABNORMAL LOW (ref 35.0–47.0)
Hemoglobin: 11.1 g/dL — ABNORMAL LOW (ref 12.0–16.0)
LYMPHS ABS: 1 10*3/uL (ref 1.0–3.6)
Lymphocytes Relative: 16 %
MCH: 31.4 pg (ref 26.0–34.0)
MCHC: 34 g/dL (ref 32.0–36.0)
MCV: 92.2 fL (ref 80.0–100.0)
MONO ABS: 0.4 10*3/uL (ref 0.2–0.9)
MONOS PCT: 6 %
Neutro Abs: 4.9 10*3/uL (ref 1.4–6.5)
Neutrophils Relative %: 78 %
PLATELETS: 168 10*3/uL (ref 150–440)
RBC: 3.55 MIL/uL — ABNORMAL LOW (ref 3.80–5.20)
RDW: 15.6 % — AB (ref 11.5–14.5)
WBC: 6.3 10*3/uL (ref 3.6–11.0)

## 2016-12-24 LAB — COMPREHENSIVE METABOLIC PANEL
ALK PHOS: 61 U/L (ref 38–126)
ALT: 17 U/L (ref 14–54)
ANION GAP: 7 (ref 5–15)
AST: 17 U/L (ref 15–41)
Albumin: 2.5 g/dL — ABNORMAL LOW (ref 3.5–5.0)
BUN: 32 mg/dL — ABNORMAL HIGH (ref 6–20)
CALCIUM: 8.4 mg/dL — AB (ref 8.9–10.3)
CHLORIDE: 89 mmol/L — AB (ref 101–111)
CO2: 30 mmol/L (ref 22–32)
Creatinine, Ser: 0.88 mg/dL (ref 0.44–1.00)
GFR calc non Af Amer: 59 mL/min — ABNORMAL LOW (ref >60)
Glucose, Bld: 146 mg/dL — ABNORMAL HIGH (ref 65–99)
POTASSIUM: 4 mmol/L (ref 3.5–5.1)
SODIUM: 126 mmol/L — AB (ref 135–145)
Total Bilirubin: 0.5 mg/dL (ref 0.3–1.2)
Total Protein: 5.8 g/dL — ABNORMAL LOW (ref 6.5–8.1)

## 2016-12-24 LAB — BRAIN NATRIURETIC PEPTIDE: B Natriuretic Peptide: 441 pg/mL — ABNORMAL HIGH (ref 0.0–100.0)

## 2016-12-24 NOTE — Progress Notes (Signed)
Location:      Place of Service:  SNF (31) Provider:  Toni Arthurs, NP-C  PROVIDER NOT IN SYSTEM  Patient Care Team: Provider Not In System as PCP - General Teodoro Spray, MD as Consulting Physician (Cardiology)  Extended Emergency Contact Information Primary Emergency Contact: Jettie Booze of [place] Phone: [phone] Relation: Daughter Secondary Emergency Contact: Gerty of [place] Phone: [phone] Relation: Grandson  Code Status:  dnr Goals of care: Advanced Directive information Advanced Directives 12/10/2016  Does Patient Have a Medical Advance Directive? Yes  Type of Paramedic of [place];Living will;Out of facility DNR (pink MOST or yellow form)  Does patient want to make changes to medical advance directive? Yes (Inpatient - patient defers changing a medical advance directive at this time)  Copy of [place] in Chart? No - copy requested  Pre-existing out of facility DNR order (yellow form or pink MOST form) -     Chief Complaint  Patient presents with  . Acute Visit    HPI:  Pt is a 82 y.o. female seen today for an acute visit for persistent hyperglycemia and cough with congestion. Pt has had CBG's consistently above 200 since admit. Insulin adjustments have been made several times with ongoing titration of 75/25. Pt is not having any complications from this. Pt does c/o cough, congestion, productive cough with green/yellow sputum. C/o upper back, muscular pain with cough, throat pain and Bilateral ear pain. Generalized increased weakness and malaise. Pt reports chills but unknown if she had a fever at that time. Occipital Headache intermittently for several days- achey pain without n/v. No eye drainage. No ear drainage/ pain. Does c/o nasal congestion, uses a "spray" at home but unsure the name. VSS have remained stable.   Past Medical History:  Diagnosis Date  . CAD  (coronary artery disease)    status post 2 stents (Dr. Claiborne Billings, Dr. Elisabeth Cara, Dr. Fath-cardiologist) 2008  . DM (diabetes mellitus) ([place])   . Glaucoma   . HTN (hypertension)    Past Surgical History:  Procedure Laterality Date  . CATARACT EXTRACTION     left eye  . CORONARY ANGIOPLASTY WITH STENT PLACEMENT     x 2    Allergies  Allergen Reactions  . Norvasc [Amlodipine Besylate]     Generic Norvasc-->cough  . Celebrex [Celecoxib] Rash    Hives, itching      Allergies as of 12/24/2016      Reactions   Norvasc [amlodipine Besylate]    Generic Norvasc-->cough   Celebrex [celecoxib] Rash   Hives, itching       Medication List       Accurate as of 12/24/16  2:36 PM. Always use your most recent med list.          acetaminophen 325 MG tablet Commonly known as:  TYLENOL Take 650 mg by mouth every 6 (six) hours as needed.   amLODipine 5 MG tablet Commonly known as:  NORVASC Take 5 mg by mouth daily.   aspirin 81 MG tablet Take 1 tablet (81 mg total) by mouth daily. Start on 12/07/2012   cloNIDine 0.1 MG tablet Commonly known as:  CATAPRES Take 0.1 mg by mouth 2 (two) times daily.   insulin aspart 100 UNIT/ML injection Commonly known as:  novoLOG Inject 5 Units into the skin 3 (three) times daily with meals. And sliding scale insulin according to the scale, thank you   insulin glargine 100 UNIT/ML  injection Commonly known as:  LANTUS Inject 0.16 mLs (16 Units total) into the skin at bedtime.   isosorbide mononitrate 30 MG 24 hr tablet Commonly known as:  IMDUR TAKE 1 TABLET DAILY   latanoprost 0.005 % ophthalmic solution Commonly known as:  XALATAN Place 1 drop into both eyes at bedtime.   levothyroxine 50 MCG tablet Commonly known as:  SYNTHROID, LEVOTHROID Take 50 mcg by mouth daily.   metFORMIN 500 MG tablet Commonly known as:  GLUCOPHAGE Take 500 mg by mouth 2 (two) times daily with a meal.   metoprolol succinate 100 MG 24 hr tablet Commonly known  as:  TOPROL-XL Take 100 mg by mouth 2 (two) times daily. Take with or immediately following a meal.   MUSCLE RUB 10-15 % Crea Apply 1 application topically 2 (two) times daily as needed.   nitroGLYCERIN 0.4 MG SL tablet Commonly known as:  NITROSTAT Place 0.4 mg under the tongue every 5 (five) minutes as needed.   olmesartan-hydrochlorothiazide 40-25 MG tablet Commonly known as:  BENICAR HCT Take 1 tablet by mouth daily.   polymixin-bacitracin 500-10000 UNIT/GM Oint ointment Apply 1 application topically 2 (two) times daily.   potassium chloride 8 MEQ tablet Commonly known as:  KLOR-CON Take 8 mEq by mouth 2 (two) times daily.   rosuvastatin 5 MG tablet Commonly known as:  CRESTOR Take 5 mg by mouth every Monday, Wednesday, and Friday.   senna-docusate 8.6-50 MG tablet Commonly known as:  Senokot-S Take 1 tablet by mouth 2 (two) times daily as needed for constipation.   traMADol 50 MG tablet Commonly known as:  ULTRAM Take 2 tablets (100 mg total) by mouth every 6 (six) hours as needed for moderate pain.       Review of Systems  Constitutional: Positive for chills, fatigue and fever (questionable). Negative for activity change, appetite change and diaphoresis.  HENT: Positive for congestion and sore throat. Negative for ear discharge, ear pain, facial swelling, nosebleeds, rhinorrhea, sinus pain, sinus pressure, sneezing, trouble swallowing and voice change.   Eyes: Negative for pain, redness and visual disturbance.  Respiratory: Positive for cough. Negative for apnea, choking, chest tightness, shortness of breath and wheezing.   Cardiovascular: Negative for chest pain, palpitations and leg swelling.  Gastrointestinal: Positive for abdominal pain. Negative for abdominal distention, constipation, diarrhea, nausea and vomiting.  Genitourinary: Negative for difficulty urinating, dysuria, frequency and urgency.  Musculoskeletal: Positive for back pain. Negative for gait  problem and myalgias. Arthralgias: typical arthritis.  Skin: Negative for color change, pallor, rash and wound.  Neurological: Positive for weakness and headaches. Negative for dizziness, tremors, syncope, speech difficulty and numbness.  Psychiatric/Behavioral: Negative for agitation and behavioral problems.  All other systems reviewed and are negative.    There is no immunization history on file for this patient. Pertinent  Health Maintenance Due  Topic Date Due  . FOOT EXAM  08/28/1940  . OPHTHALMOLOGY EXAM  [birth year]/28/1940  . DEXA SCAN  08/28/1997  . PNA vac Low Risk Adult (1 of 2 - PCV13) 08/28/1997  . INFLUENZA VACCINE  06/09/2016  . HEMOGLOBIN A1C  06/11/2017   No flowsheet data found. Functional Status Survey:    Vitals:   12/24/16 0600  BP: (!) 159/56  Pulse: 62  Resp: 17  Temp: 97.5 F (36.4 C)  SpO2: 96%   There is no height or weight on file to calculate BMI. Physical Exam  Constitutional: She is oriented to person, place, and time. Vital signs are normal. She  appears well-developed and well-nourished. She appears lethargic. She is active and cooperative. She has a sickly appearance. She does not appear ill. No distress.  HENT:  Head: Normocephalic and atraumatic.  Mouth/Throat: Uvula is midline and oropharynx is clear and moist. Mucous membranes are not pale, dry and not cyanotic.  Eyes: Conjunctivae, EOM and lids are normal. Pupils are equal, round, and reactive to light.  Neck: Trachea normal, normal range of motion and full passive range of motion without pain. Neck supple. No JVD present. No tracheal deviation, no edema and no erythema present. No thyromegaly present.  Cardiovascular: Normal rate, regular rhythm, normal heart sounds, intact distal pulses and normal pulses.  Exam reveals no gallop, no distant heart sounds and no friction rub.   No murmur heard. Pulses:      Dorsalis pedis pulses are 2+ on the right side, and 2+ on the left side.  Pulmonary/Chest:  Effort normal. No accessory muscle usage. No respiratory distress. She has no decreased breath sounds. She has no wheezes. She has rhonchi in the right lower field. She has rales in the right lower field. She exhibits no tenderness.  Abdominal: Soft. Normal appearance and bowel sounds are normal. She exhibits no distension and no ascites. There is tenderness in the left lower quadrant.  Musculoskeletal: Normal range of motion. She exhibits no edema or tenderness.  Expected osteoarthritis, stiffness  Neurological: She is oriented to person, place, and time. She has normal strength. She appears lethargic. She is not disoriented.  Skin: Skin is warm, dry and intact. She is not diaphoretic. No cyanosis. No pallor. Nails show no clubbing.  Psychiatric: She has a normal mood and affect. Her speech is normal and behavior is normal. Judgment and thought content normal. Cognition and memory are normal.  Nursing note and vitals reviewed.   Labs reviewed:  Recent Labs  12/12/16 0543 12/13/16 0444 12/14/16 0428  NA 133* 133* 133*  K 3.5 3.4* 3.6  CL 102 103 103  CO2 26 25 26   GLUCOSE 117* 106* 89  BUN 26* 31* 32*  CREATININE 0.87 1.01* 0.87  CALCIUM 8.6* 8.4* 8.5*    Recent Labs  12/10/16 0901  AST 40  ALT 25  ALKPHOS 61  BILITOT 2.2*  PROT 7.3  ALBUMIN 4.4    Recent Labs  12/10/16 0901 12/14/16 0428  WBC 11.9* 4.1  HGB 15.2 12.7  HCT 45.5 38.0  MCV 93.2 93.5  PLT 120* 131*   Lab Results  Component Value Date   TSH 0.980 11/30/2012   Lab Results  Component Value Date   HGBA1C 11.6 (H) 12/12/2016   Lab Results  Component Value Date   CHOL 122 11/30/2012   HDL 57 11/30/2012   LDLCALC 54 11/30/2012   TRIG 57 11/30/2012   CHOLHDL 2.1 11/30/2012    Significant Diagnostic Results in last 30 days:  Dg Chest 1 View  Result Date: 12/10/2016 CLINICAL DATA:  Altered mental status. Coronary artery disease, diabetes, and hypertension. EXAM: CHEST 1 VIEW COMPARISON:   11/30/2012 FINDINGS: The heart size and mediastinal contours are within normal limits. Aortic atherosclerosis noted. Both lungs are clear. No evidence of pleural effusion or pneumothorax. IMPRESSION: No active disease. Electronically Signed   By: Earle Gell M.D.   On: 12/10/2016 10:02   Ct Head Wo Contrast  Result Date: 12/10/2016 CLINICAL DATA:  Altered mental status.  Unwitnessed fall. EXAM: CT HEAD WITHOUT CONTRAST TECHNIQUE: Contiguous axial images were obtained from the base of the skull through  the vertex without intravenous contrast. COMPARISON:  Head CT 12/04/2012 FINDINGS: Brain: Slightly progressive age related cerebral atrophy, ventriculomegaly and periventricular white matter disease. No extra-axial fluid collections are identified. No CT findings for acute hemispheric infarction or intracranial hemorrhage. No mass lesions. The brainstem and cerebellum are normal. Vascular: Stable vascular calcifications. No aneurysm or hyperdense vessels. Skull: No skull fracture or bone lesion. Sinuses/Orbits: The paranasal sinuses and mastoid air cells are clear. The globes are intact. Other: No scalp lesion or hematoma. IMPRESSION: Progressive age related cerebral atrophy, ventriculomegaly and periventricular white matter disease since 2014. No acute intracranial findings or skull fracture. Electronically Signed   By: Marijo Sanes M.D.   On: 12/10/2016 09:42    Assessment/Plan 1. Cough  Labs  cxr  R/o influenza  ranitadine 75 mg po BID  Fluticasone 2 sprays each nostrol daily for cough, PND  If cough persists, will add Z-pak  2. Hyperglycemia  Increase the Humalog 75-25 to 14 units TID  3. Generalized weakness  Pro stat 30 mL po BID  Ensure liquid- 1 can po BID  4. Hyponatremia  Insert and maintain peripheral IV for infusion of IV fluids  0.9% NS 250 mL bolus over 1 hour, then  Continuous at 75 ml/ hr x 48 hours  Recheck labs next week  Family/ staff Communication:   Total  Time:  Documentation:  Face to Face:  Family/Phone:   Labs/tests ordered:  Cbc, met c, bnp, flu swab, 2 v cxr  Medication list reviewed and assessed for continued appropriateness.  Vikki Ports, NP-C Geriatric[facility] [phone], [place] [address] Cell Phone (Mon-Fri 8am-5pm):  [phone] On Call:  [phone] & follow prompts after 5pm & weekends Office Phone:  [phone] Office Fax:  [phone]

## 2016-12-25 ENCOUNTER — Ambulatory Visit
Admission: RE | Admit: 2016-12-25 | Discharge: 2016-12-25 | Disposition: A | Discharge status: Home / Self Care | Payer: Medicare Other | Source: Ambulatory Visit | Attending: Gerontology | Admitting: Gerontology

## 2016-12-25 DIAGNOSIS — E871 Hypo-osmolality and hyponatremia: Secondary | ICD-10-CM | POA: Diagnosis not present

## 2016-12-25 DIAGNOSIS — R1312 Dysphagia, oropharyngeal phase: Secondary | ICD-10-CM | POA: Diagnosis not present

## 2016-12-25 LAB — GLUCOSE, CAPILLARY
GLUCOSE-CAPILLARY: 211 mg/dL — AB (ref 65–99)
GLUCOSE-CAPILLARY: 202 mg/dL — AB (ref 65–99)
Glucose-Capillary: 236 mg/dL — ABNORMAL HIGH (ref 65–99)
Glucose-Capillary: 185 mg/dL — ABNORMAL HIGH (ref 65–99)
Glucose-Capillary: 150 mg/dL — ABNORMAL HIGH (ref 65–99)
Glucose-Capillary: 288 mg/dL — ABNORMAL HIGH (ref 65–99)

## 2016-12-25 NOTE — Therapy (Signed)
Jody Dorsey, [place], [address] Phone: [phone]   Fax:     Modified Barium Swallow  Patient Details  Name: Jody Dorsey MRN: [id number] Date of Birth: 09/19/1930 No Data Recorded  Encounter Date: 12/25/2016    Past Medical History:  Diagnosis Date  . CAD (coronary artery disease)    status post 2 stents (Dr. Claiborne Billings, Dr. Elisabeth Cara, Dr. Fath-cardiologist) 2008  . DM (diabetes mellitus) ([place])   . Glaucoma   . HTN (hypertension)     Past Surgical History:  Procedure Laterality Date  . CATARACT EXTRACTION     left eye  . CORONARY ANGIOPLASTY WITH STENT PLACEMENT     x 2    There were no vitals filed for this visit.   Subjective: Patient behavior: (alertness, ability to follow instructions, etc.): Patient is able to answer questions and follow directions.  She has a wet cough prior to beginning POs  Chief complaint: clinical indicators of aspiration   Objective:  Radiological Procedure: A videoflouroscopic evaluation of oral-preparatory, reflex initiation, and pharyngeal phases of the swallow was performed; as well as a screening of the upper esophageal phase.  I. POSTURE: Upright in MBS chair  II. VIEW: Lateral  III. COMPENSATORY STRATEGIES: N/A  IV. BOLUSES ADMINISTERED:   Thin Liquid: 2 small sips, 4 rapid, consecutive sips   Nectar-thick Liquid: 2 rapid consecutive sips    Puree: 2 teaspoon presentations   Mechanical Soft: 1/4 graham cracker in applesauce  V. RESULTS OF EVALUATION: A. ORAL PREPARATORY PHASE: (The lips, tongue, and velum are observed for strength and coordination)       **Overall Severity Rating: Minimal; slightly sluggish management, functional to masticate hard cracker in moist substrate  B. SWALLOW INITIATION/REFLEX: (The reflex is normal if "triggered" by the time the bolus reached the base of the tongue)  **Overall Severity Rating: Mild; triggers at the  valleculae i C. PHARYNGEAL PHASE: (Pharyngeal function is normal if the bolus shows rapid, smooth, and continuous transit through the pharynx and there is no pharyngeal residue after the swallow)  **Overall Severity Rating: Mild; decreased anterior hyolaryngeal movement and incomplete epiglottic inversion with residue trapped within the valleculae   D. LARYNGEAL PENETRATION: (Material entering into the laryngeal inlet/vestibule but not aspirated) None  E. ASPIRATION: None  F. ESOPHAGEAL PHASE: (Screening of the upper esophagus): no abnormality within the viewable cervical esophagus  ASSESSMENT: This 83 year old woman, with acute URI, is presenting with mild oropharyngeal dysphagia.  Oral control of the bolus including oral hold, rotary mastication, and anterior to posterior transfer are within functional limits, slightly disorganized and sluggish. Timing of the pharyngeal swallow is mildly delayed, triggering at the valleculae.  There is decreased anterior hyolaryngeal movement and incomplete epiglottic inversion with residue trapped within the valleculae post swallow.   There is no observed laryngeal penetration or tracheal aspiration.  The patient does not appear to be at significant risk for prandial aspiration.  PLAN/RECOMMENDATIONS:   A. Diet: Regular   B. Swallowing Precautions: Standard   C. Recommended consultation to: MD, management of URI   D. Therapy recommendations: monitor for change in swallow    E. Results and recommendations were discussed with the patient immediately following the study and the final report routed to the referring practitioner and treating SLP.    Patient will benefit from skilled therapeutic intervention in order to improve the following deficits and impairments:   Dysphagia, oropharyngeal - Plan: DG OP Swallowing  Func-Medicare/Speech Path, DG OP Swallowing Func-Medicare/Speech Path      G-Codes - 2016/12/27 1309    Functional Assessment Tool Used  MBSS, clinical judgment   Functional Limitations Swallowing   Swallow Current Status [id number]) At least 20 percent but less than 40 percent impaired, limited or restricted   Swallow Goal Status [id number]) At least 20 percent but less than 40 percent impaired, limited or restricted   Swallow Discharge Status [phone]) At least 20 percent but less than 40 percent impaired, limited or restricted          Problem List Patient Active Problem List   Diagnosis Date Noted  . Hyperglycemia 12/24/2016  . Generalized weakness 12/14/2016  . Hypokalemia 12/14/2016  . Hyponatremia 12/14/2016  . Accelerated hypertension 12/14/2016  . Acute renal insufficiency 12/14/2016  . Thrombocytopenia ([place]) 12/14/2016  . DKA (diabetic ketoacidoses) ([place]) 12/10/2016  . Pressure injury of skin 12/10/2016  . Closed head injury with petechial brain hemorrhage ([place]) 11/30/2012  . SDH (subdural hematoma) ([place]) 11/30/2012  . Diabetes mellitus ([place]) 11/30/2012  . Laceration 11/30/2012   Jody Sea, MS/CCC- SLP  Lou Miner 12/27/16, 1:10 PM  [facility] [place], [place], [address] Phone: [phone]   Fax:     Name: Jody Dorsey MRN: [id number] Date of Birth: 11/19/1929

## 2016-12-28 DIAGNOSIS — E871 Hypo-osmolality and hyponatremia: Secondary | ICD-10-CM | POA: Diagnosis not present

## 2016-12-28 LAB — GLUCOSE, CAPILLARY
GLUCOSE-CAPILLARY: 158 mg/dL — AB (ref 65–99)
GLUCOSE-CAPILLARY: 304 mg/dL — AB (ref 65–99)
Glucose-Capillary: 141 mg/dL — ABNORMAL HIGH (ref 65–99)
Glucose-Capillary: 243 mg/dL — ABNORMAL HIGH (ref 65–99)
Glucose-Capillary: 230 mg/dL — ABNORMAL HIGH (ref 65–99)
Glucose-Capillary: 253 mg/dL — ABNORMAL HIGH (ref 65–99)
Glucose-Capillary: 149 mg/dL — ABNORMAL HIGH (ref 65–99)
Glucose-Capillary: 191 mg/dL — ABNORMAL HIGH (ref 65–99)
Glucose-Capillary: 283 mg/dL — ABNORMAL HIGH (ref 65–99)
Glucose-Capillary: 257 mg/dL — ABNORMAL HIGH (ref 65–99)

## 2016-12-29 DIAGNOSIS — E871 Hypo-osmolality and hyponatremia: Secondary | ICD-10-CM | POA: Diagnosis not present

## 2016-12-29 LAB — GLUCOSE, CAPILLARY
Glucose-Capillary: 110 mg/dL — ABNORMAL HIGH (ref 65–99)
Glucose-Capillary: 247 mg/dL — ABNORMAL HIGH (ref 65–99)
Glucose-Capillary: 85 mg/dL (ref 65–99)
Glucose-Capillary: 215 mg/dL — ABNORMAL HIGH (ref 65–99)

## 2016-12-30 DIAGNOSIS — E871 Hypo-osmolality and hyponatremia: Secondary | ICD-10-CM | POA: Diagnosis not present

## 2016-12-30 LAB — GLUCOSE, CAPILLARY
GLUCOSE-CAPILLARY: 374 mg/dL — AB (ref 65–99)
Glucose-Capillary: 403 mg/dL — ABNORMAL HIGH (ref 65–99)
Glucose-Capillary: 158 mg/dL — ABNORMAL HIGH (ref 65–99)
Glucose-Capillary: 189 mg/dL — ABNORMAL HIGH (ref 65–99)
Glucose-Capillary: 202 mg/dL — ABNORMAL HIGH (ref 65–99)

## 2016-12-31 LAB — GLUCOSE, CAPILLARY
GLUCOSE-CAPILLARY: 150 mg/dL — AB (ref 65–99)
Glucose-Capillary: 142 mg/dL — ABNORMAL HIGH (ref 65–99)
Glucose-Capillary: 154 mg/dL — ABNORMAL HIGH (ref 65–99)
Glucose-Capillary: 169 mg/dL — ABNORMAL HIGH (ref 65–99)

## 2017-01-01 DIAGNOSIS — E871 Hypo-osmolality and hyponatremia: Secondary | ICD-10-CM | POA: Diagnosis not present

## 2017-01-01 LAB — COMPREHENSIVE METABOLIC PANEL
ALK PHOS: 73 U/L (ref 38–126)
ALT: 20 U/L (ref 14–54)
AST: 19 U/L (ref 15–41)
Albumin: 2.3 g/dL — ABNORMAL LOW (ref 3.5–5.0)
Anion gap: 9 (ref 5–15)
BILIRUBIN TOTAL: 0.3 mg/dL (ref 0.3–1.2)
BUN: 37 mg/dL — AB (ref 6–20)
CALCIUM: 8.3 mg/dL — AB (ref 8.9–10.3)
CO2: 29 mmol/L (ref 22–32)
Chloride: 90 mmol/L — ABNORMAL LOW (ref 101–111)
Creatinine, Ser: 0.95 mg/dL (ref 0.44–1.00)
GFR calc Af Amer: >60 mL/min (ref >60)
GFR, EST NON AFRICAN AMERICAN: 53 mL/min — AB (ref >60)
Glucose, Bld: 304 mg/dL — ABNORMAL HIGH (ref 65–99)
POTASSIUM: 4.2 mmol/L (ref 3.5–5.1)
Sodium: 128 mmol/L — ABNORMAL LOW (ref 135–145)
TOTAL PROTEIN: 6 g/dL — AB (ref 6.5–8.1)

## 2017-01-01 LAB — CBC WITH DIFFERENTIAL/PLATELET
BASOS ABS: 0 10*3/uL (ref 0–0.1)
Basophils Relative: 0 %
Eosinophils Absolute: 0.1 10*3/uL (ref 0–0.7)
Eosinophils Relative: 1 %
HEMATOCRIT: 30.2 % — AB (ref 35.0–47.0)
Hemoglobin: 10 g/dL — ABNORMAL LOW (ref 12.0–16.0)
LYMPHS PCT: 10 %
Lymphs Abs: 0.8 10*3/uL — ABNORMAL LOW (ref 1.0–3.6)
MCH: 31.3 pg (ref 26.0–34.0)
MCHC: 33.2 g/dL (ref 32.0–36.0)
MCV: 94.1 fL (ref 80.0–100.0)
MONO ABS: 0.7 10*3/uL (ref 0.2–0.9)
MONOS PCT: 9 %
NEUTROS ABS: 6.6 10*3/uL — AB (ref 1.4–6.5)
Neutrophils Relative %: 80 %
Platelets: 312 10*3/uL (ref 150–440)
RBC: 3.21 MIL/uL — ABNORMAL LOW (ref 3.80–5.20)
RDW: 15.2 % — AB (ref 11.5–14.5)
WBC: 8.2 10*3/uL (ref 3.6–11.0)

## 2017-01-01 LAB — GLUCOSE, CAPILLARY
GLUCOSE-CAPILLARY: 265 mg/dL — AB (ref 65–99)
GLUCOSE-CAPILLARY: 106 mg/dL — AB (ref 65–99)
GLUCOSE-CAPILLARY: 209 mg/dL — AB (ref 65–99)
Glucose-Capillary: 150 mg/dL — ABNORMAL HIGH (ref 65–99)
Glucose-Capillary: 260 mg/dL — ABNORMAL HIGH (ref 65–99)
Glucose-Capillary: 177 mg/dL — ABNORMAL HIGH (ref 65–99)

## 2017-01-04 DIAGNOSIS — E871 Hypo-osmolality and hyponatremia: Secondary | ICD-10-CM | POA: Diagnosis not present

## 2017-01-04 LAB — GLUCOSE, CAPILLARY
GLUCOSE-CAPILLARY: 227 mg/dL — AB (ref 65–99)
GLUCOSE-CAPILLARY: 97 mg/dL (ref 65–99)
GLUCOSE-CAPILLARY: 118 mg/dL — AB (ref 65–99)
GLUCOSE-CAPILLARY: 106 mg/dL — AB (ref 65–99)
GLUCOSE-CAPILLARY: 274 mg/dL — AB (ref 65–99)
Glucose-Capillary: 172 mg/dL — ABNORMAL HIGH (ref 65–99)
Glucose-Capillary: 129 mg/dL — ABNORMAL HIGH (ref 65–99)
Glucose-Capillary: 127 mg/dL — ABNORMAL HIGH (ref 65–99)
Glucose-Capillary: 140 mg/dL — ABNORMAL HIGH (ref 65–99)
Glucose-Capillary: 242 mg/dL — ABNORMAL HIGH (ref 65–99)

## 2017-01-05 DIAGNOSIS — E871 Hypo-osmolality and hyponatremia: Secondary | ICD-10-CM | POA: Diagnosis not present

## 2017-01-05 LAB — GLUCOSE, CAPILLARY
GLUCOSE-CAPILLARY: 53 mg/dL — AB (ref 65–99)
GLUCOSE-CAPILLARY: 366 mg/dL — AB (ref 65–99)
GLUCOSE-CAPILLARY: 140 mg/dL — AB (ref 65–99)
GLUCOSE-CAPILLARY: 137 mg/dL — AB (ref 65–99)
Glucose-Capillary: 230 mg/dL — ABNORMAL HIGH (ref 65–99)
Glucose-Capillary: 215 mg/dL — ABNORMAL HIGH (ref 65–99)

## 2017-01-06 LAB — GLUCOSE, CAPILLARY
GLUCOSE-CAPILLARY: 161 mg/dL — AB (ref 65–99)
Glucose-Capillary: 153 mg/dL — ABNORMAL HIGH (ref 65–99)

## 2017-01-07 ENCOUNTER — Encounter
Admission: RE | Admit: 2017-01-07 | Discharge: 2017-01-07 | Disposition: A | Discharge status: Home / Self Care | Payer: Medicare Other | Source: Ambulatory Visit | Attending: Internal Medicine | Admitting: Internal Medicine

## 2017-01-07 DIAGNOSIS — E119 Type 2 diabetes mellitus without complications: Secondary | ICD-10-CM | POA: Diagnosis not present

## 2017-01-07 LAB — GLUCOSE, CAPILLARY
GLUCOSE-CAPILLARY: 94 mg/dL (ref 65–99)
GLUCOSE-CAPILLARY: 295 mg/dL — AB (ref 65–99)
GLUCOSE-CAPILLARY: 226 mg/dL — AB (ref 65–99)
Glucose-Capillary: 104 mg/dL — ABNORMAL HIGH (ref 65–99)

## 2017-01-08 DIAGNOSIS — E119 Type 2 diabetes mellitus without complications: Secondary | ICD-10-CM | POA: Diagnosis not present

## 2017-01-08 LAB — GLUCOSE, CAPILLARY
GLUCOSE-CAPILLARY: 184 mg/dL — AB (ref 65–99)
GLUCOSE-CAPILLARY: 204 mg/dL — AB (ref 65–99)
GLUCOSE-CAPILLARY: 317 mg/dL — AB (ref 65–99)
GLUCOSE-CAPILLARY: 218 mg/dL — AB (ref 65–99)
Glucose-Capillary: 71 mg/dL (ref 65–99)

## 2017-01-11 DIAGNOSIS — E119 Type 2 diabetes mellitus without complications: Secondary | ICD-10-CM | POA: Diagnosis not present

## 2017-01-11 LAB — GLUCOSE, CAPILLARY
GLUCOSE-CAPILLARY: 259 mg/dL — AB (ref 65–99)
Glucose-Capillary: 184 mg/dL — ABNORMAL HIGH (ref 65–99)
Glucose-Capillary: 178 mg/dL — ABNORMAL HIGH (ref 65–99)

## 2017-01-12 DIAGNOSIS — E119 Type 2 diabetes mellitus without complications: Secondary | ICD-10-CM | POA: Diagnosis not present

## 2017-01-12 LAB — GLUCOSE, CAPILLARY
GLUCOSE-CAPILLARY: 93 mg/dL (ref 65–99)
GLUCOSE-CAPILLARY: 83 mg/dL (ref 65–99)

## 2017-01-13 DIAGNOSIS — E119 Type 2 diabetes mellitus without complications: Secondary | ICD-10-CM | POA: Diagnosis not present

## 2017-01-13 LAB — GLUCOSE, CAPILLARY: GLUCOSE-CAPILLARY: 83 mg/dL (ref 65–99)

## 2017-01-15 DIAGNOSIS — E119 Type 2 diabetes mellitus without complications: Secondary | ICD-10-CM | POA: Diagnosis not present

## 2017-01-15 LAB — GLUCOSE, CAPILLARY: GLUCOSE-CAPILLARY: 267 mg/dL — AB (ref 65–99)

## 2017-01-18 LAB — GLUCOSE, CAPILLARY
GLUCOSE-CAPILLARY: 267 mg/dL — AB (ref 65–99)
GLUCOSE-CAPILLARY: 209 mg/dL — AB (ref 65–99)
GLUCOSE-CAPILLARY: 131 mg/dL — AB (ref 65–99)
Glucose-Capillary: 166 mg/dL — ABNORMAL HIGH (ref 65–99)
Glucose-Capillary: 288 mg/dL — ABNORMAL HIGH (ref 65–99)
Glucose-Capillary: 192 mg/dL — ABNORMAL HIGH (ref 65–99)

## 2017-01-21 DIAGNOSIS — E119 Type 2 diabetes mellitus without complications: Secondary | ICD-10-CM | POA: Diagnosis not present

## 2017-01-21 LAB — GLUCOSE, CAPILLARY
GLUCOSE-CAPILLARY: 228 mg/dL — AB (ref 65–99)
Glucose-Capillary: 120 mg/dL — ABNORMAL HIGH (ref 65–99)

## 2017-01-22 DIAGNOSIS — E119 Type 2 diabetes mellitus without complications: Secondary | ICD-10-CM | POA: Diagnosis not present

## 2017-01-22 LAB — GLUCOSE, CAPILLARY
GLUCOSE-CAPILLARY: 108 mg/dL — AB (ref 65–99)
GLUCOSE-CAPILLARY: 72 mg/dL (ref 65–99)

## 2017-01-25 DIAGNOSIS — E119 Type 2 diabetes mellitus without complications: Secondary | ICD-10-CM | POA: Diagnosis not present

## 2017-01-25 LAB — GLUCOSE, CAPILLARY: Glucose-Capillary: 263 mg/dL — ABNORMAL HIGH (ref 65–99)

## 2017-01-26 DIAGNOSIS — E119 Type 2 diabetes mellitus without complications: Secondary | ICD-10-CM | POA: Diagnosis not present

## 2017-01-26 LAB — GLUCOSE, CAPILLARY
Glucose-Capillary: 262 mg/dL — ABNORMAL HIGH (ref 65–99)
Glucose-Capillary: 148 mg/dL — ABNORMAL HIGH (ref 65–99)
Glucose-Capillary: 126 mg/dL — ABNORMAL HIGH (ref 65–99)

## 2017-01-27 LAB — GLUCOSE, CAPILLARY
Glucose-Capillary: 208 mg/dL — ABNORMAL HIGH (ref 65–99)
Glucose-Capillary: 136 mg/dL — ABNORMAL HIGH (ref 65–99)

## 2017-01-29 DIAGNOSIS — E119 Type 2 diabetes mellitus without complications: Secondary | ICD-10-CM | POA: Diagnosis not present

## 2017-01-29 LAB — GLUCOSE, CAPILLARY: Glucose-Capillary: 212 mg/dL — ABNORMAL HIGH (ref 65–99)

## 2017-02-01 LAB — GLUCOSE, CAPILLARY
GLUCOSE-CAPILLARY: 135 mg/dL — AB (ref 65–99)
GLUCOSE-CAPILLARY: 181 mg/dL — AB (ref 65–99)
GLUCOSE-CAPILLARY: 108 mg/dL — AB (ref 65–99)
Glucose-Capillary: 85 mg/dL (ref 65–99)
Glucose-Capillary: 82 mg/dL (ref 65–99)
Glucose-Capillary: 78 mg/dL (ref 65–99)
Glucose-Capillary: 132 mg/dL — ABNORMAL HIGH (ref 65–99)
Glucose-Capillary: 58 mg/dL — ABNORMAL LOW (ref 65–99)

## 2017-02-04 DIAGNOSIS — E119 Type 2 diabetes mellitus without complications: Secondary | ICD-10-CM | POA: Diagnosis not present

## 2017-02-04 LAB — GLUCOSE, CAPILLARY
GLUCOSE-CAPILLARY: 164 mg/dL — AB (ref 65–99)
GLUCOSE-CAPILLARY: 231 mg/dL — AB (ref 65–99)
GLUCOSE-CAPILLARY: 153 mg/dL — AB (ref 65–99)
Glucose-Capillary: 171 mg/dL — ABNORMAL HIGH (ref 65–99)
Glucose-Capillary: 83 mg/dL (ref 65–99)
Glucose-Capillary: 213 mg/dL — ABNORMAL HIGH (ref 65–99)

## 2017-02-05 DIAGNOSIS — E119 Type 2 diabetes mellitus without complications: Secondary | ICD-10-CM | POA: Diagnosis not present

## 2017-02-05 LAB — GLUCOSE, CAPILLARY
GLUCOSE-CAPILLARY: 99 mg/dL (ref 65–99)
GLUCOSE-CAPILLARY: 137 mg/dL — AB (ref 65–99)

## 2017-02-07 ENCOUNTER — Encounter
Admission: RE | Admit: 2017-02-07 | Discharge: 2017-02-07 | Disposition: A | Discharge status: Home / Self Care | Payer: Medicare Other | Source: Ambulatory Visit | Attending: Internal Medicine | Admitting: Internal Medicine

## 2017-02-07 DIAGNOSIS — E119 Type 2 diabetes mellitus without complications: Secondary | ICD-10-CM | POA: Insufficient documentation

## 2017-02-08 DIAGNOSIS — E119 Type 2 diabetes mellitus without complications: Secondary | ICD-10-CM | POA: Diagnosis not present

## 2017-02-08 LAB — GLUCOSE, CAPILLARY
GLUCOSE-CAPILLARY: 171 mg/dL — AB (ref 65–99)
Glucose-Capillary: 269 mg/dL — ABNORMAL HIGH (ref 65–99)
Glucose-Capillary: 188 mg/dL — ABNORMAL HIGH (ref 65–99)

## 2017-02-09 DIAGNOSIS — E119 Type 2 diabetes mellitus without complications: Secondary | ICD-10-CM | POA: Diagnosis not present

## 2017-02-09 LAB — GLUCOSE, CAPILLARY
Glucose-Capillary: 139 mg/dL — ABNORMAL HIGH (ref 65–99)
Glucose-Capillary: 150 mg/dL — ABNORMAL HIGH (ref 65–99)

## 2017-02-10 DIAGNOSIS — E119 Type 2 diabetes mellitus without complications: Secondary | ICD-10-CM | POA: Diagnosis not present

## 2017-02-10 LAB — GLUCOSE, CAPILLARY
GLUCOSE-CAPILLARY: 90 mg/dL (ref 65–99)
Glucose-Capillary: 302 mg/dL — ABNORMAL HIGH (ref 65–99)

## 2017-02-12 DIAGNOSIS — E119 Type 2 diabetes mellitus without complications: Secondary | ICD-10-CM | POA: Diagnosis not present

## 2017-02-12 LAB — GLUCOSE, CAPILLARY
Glucose-Capillary: 185 mg/dL — ABNORMAL HIGH (ref 65–99)
Glucose-Capillary: 164 mg/dL — ABNORMAL HIGH (ref 65–99)
Glucose-Capillary: 131 mg/dL — ABNORMAL HIGH (ref 65–99)

## 2017-02-13 DIAGNOSIS — E119 Type 2 diabetes mellitus without complications: Secondary | ICD-10-CM | POA: Diagnosis not present

## 2017-02-13 LAB — GLUCOSE, CAPILLARY
Glucose-Capillary: 108 mg/dL — ABNORMAL HIGH (ref 65–99)
Glucose-Capillary: 237 mg/dL — ABNORMAL HIGH (ref 65–99)
Glucose-Capillary: 147 mg/dL — ABNORMAL HIGH (ref 65–99)
Glucose-Capillary: 170 mg/dL — ABNORMAL HIGH (ref 65–99)

## 2017-02-14 DIAGNOSIS — E119 Type 2 diabetes mellitus without complications: Secondary | ICD-10-CM | POA: Diagnosis not present

## 2017-02-14 LAB — GLUCOSE, CAPILLARY
Glucose-Capillary: 99 mg/dL (ref 65–99)
Glucose-Capillary: 170 mg/dL — ABNORMAL HIGH (ref 65–99)
Glucose-Capillary: 179 mg/dL — ABNORMAL HIGH (ref 65–99)
Glucose-Capillary: 207 mg/dL — ABNORMAL HIGH (ref 65–99)

## 2017-02-15 DIAGNOSIS — E119 Type 2 diabetes mellitus without complications: Secondary | ICD-10-CM | POA: Diagnosis not present

## 2017-02-15 LAB — GLUCOSE, CAPILLARY
GLUCOSE-CAPILLARY: 112 mg/dL — AB (ref 65–99)
Glucose-Capillary: 127 mg/dL — ABNORMAL HIGH (ref 65–99)

## 2017-02-17 DIAGNOSIS — E119 Type 2 diabetes mellitus without complications: Secondary | ICD-10-CM | POA: Diagnosis not present

## 2017-02-17 LAB — GLUCOSE, CAPILLARY
Glucose-Capillary: 234 mg/dL — ABNORMAL HIGH (ref 65–99)
Glucose-Capillary: 184 mg/dL — ABNORMAL HIGH (ref 65–99)

## 2017-02-18 DIAGNOSIS — E119 Type 2 diabetes mellitus without complications: Secondary | ICD-10-CM | POA: Diagnosis not present

## 2017-02-18 LAB — GLUCOSE, CAPILLARY
GLUCOSE-CAPILLARY: 159 mg/dL — AB (ref 65–99)
Glucose-Capillary: 78 mg/dL (ref 65–99)

## 2017-02-19 DIAGNOSIS — E119 Type 2 diabetes mellitus without complications: Secondary | ICD-10-CM | POA: Diagnosis not present

## 2017-02-19 LAB — GLUCOSE, CAPILLARY: Glucose-Capillary: 149 mg/dL — ABNORMAL HIGH (ref 65–99)

## 2017-02-21 DIAGNOSIS — E119 Type 2 diabetes mellitus without complications: Secondary | ICD-10-CM | POA: Diagnosis not present

## 2017-02-21 LAB — GLUCOSE, CAPILLARY: GLUCOSE-CAPILLARY: 165 mg/dL — AB (ref 65–99)

## 2017-02-22 DIAGNOSIS — E119 Type 2 diabetes mellitus without complications: Secondary | ICD-10-CM | POA: Diagnosis not present

## 2017-02-22 LAB — GLUCOSE, CAPILLARY
GLUCOSE-CAPILLARY: 266 mg/dL — AB (ref 65–99)
GLUCOSE-CAPILLARY: 155 mg/dL — AB (ref 65–99)
GLUCOSE-CAPILLARY: 173 mg/dL — AB (ref 65–99)
Glucose-Capillary: 107 mg/dL — ABNORMAL HIGH (ref 65–99)

## 2017-02-23 DIAGNOSIS — E119 Type 2 diabetes mellitus without complications: Secondary | ICD-10-CM | POA: Diagnosis not present

## 2017-02-23 LAB — GLUCOSE, CAPILLARY
GLUCOSE-CAPILLARY: 167 mg/dL — AB (ref 65–99)
GLUCOSE-CAPILLARY: 228 mg/dL — AB (ref 65–99)
Glucose-Capillary: 159 mg/dL — ABNORMAL HIGH (ref 65–99)

## 2017-02-24 DIAGNOSIS — E119 Type 2 diabetes mellitus without complications: Secondary | ICD-10-CM | POA: Diagnosis not present

## 2017-02-24 LAB — GLUCOSE, CAPILLARY
GLUCOSE-CAPILLARY: 221 mg/dL — AB (ref 65–99)
Glucose-Capillary: 175 mg/dL — ABNORMAL HIGH (ref 65–99)
Glucose-Capillary: 175 mg/dL — ABNORMAL HIGH (ref 65–99)

## 2017-02-25 DIAGNOSIS — E119 Type 2 diabetes mellitus without complications: Secondary | ICD-10-CM | POA: Diagnosis not present

## 2017-02-25 LAB — GLUCOSE, CAPILLARY
Glucose-Capillary: 164 mg/dL — ABNORMAL HIGH (ref 65–99)
Glucose-Capillary: 135 mg/dL — ABNORMAL HIGH (ref 65–99)

## 2017-02-26 DIAGNOSIS — E119 Type 2 diabetes mellitus without complications: Secondary | ICD-10-CM | POA: Diagnosis not present

## 2017-02-26 LAB — GLUCOSE, CAPILLARY
GLUCOSE-CAPILLARY: 138 mg/dL — AB (ref 65–99)
GLUCOSE-CAPILLARY: 124 mg/dL — AB (ref 65–99)
Glucose-Capillary: 105 mg/dL — ABNORMAL HIGH (ref 65–99)

## 2017-02-27 DIAGNOSIS — E119 Type 2 diabetes mellitus without complications: Secondary | ICD-10-CM | POA: Diagnosis not present

## 2017-02-27 LAB — GLUCOSE, CAPILLARY
GLUCOSE-CAPILLARY: 99 mg/dL (ref 65–99)
GLUCOSE-CAPILLARY: 237 mg/dL — AB (ref 65–99)
Glucose-Capillary: 208 mg/dL — ABNORMAL HIGH (ref 65–99)

## 2017-02-28 DIAGNOSIS — E119 Type 2 diabetes mellitus without complications: Secondary | ICD-10-CM | POA: Diagnosis not present

## 2017-02-28 LAB — GLUCOSE, CAPILLARY
GLUCOSE-CAPILLARY: 241 mg/dL — AB (ref 65–99)
Glucose-Capillary: 158 mg/dL — ABNORMAL HIGH (ref 65–99)
Glucose-Capillary: 160 mg/dL — ABNORMAL HIGH (ref 65–99)

## 2017-03-01 DIAGNOSIS — E119 Type 2 diabetes mellitus without complications: Secondary | ICD-10-CM | POA: Diagnosis not present

## 2017-03-01 LAB — GLUCOSE, CAPILLARY
GLUCOSE-CAPILLARY: 138 mg/dL — AB (ref 65–99)
Glucose-Capillary: 173 mg/dL — ABNORMAL HIGH (ref 65–99)
Glucose-Capillary: 177 mg/dL — ABNORMAL HIGH (ref 65–99)

## 2017-03-02 DIAGNOSIS — E119 Type 2 diabetes mellitus without complications: Secondary | ICD-10-CM | POA: Diagnosis not present

## 2017-03-02 LAB — GLUCOSE, CAPILLARY
GLUCOSE-CAPILLARY: 139 mg/dL — AB (ref 65–99)
Glucose-Capillary: 191 mg/dL — ABNORMAL HIGH (ref 65–99)

## 2017-03-03 DIAGNOSIS — E119 Type 2 diabetes mellitus without complications: Secondary | ICD-10-CM | POA: Diagnosis not present

## 2017-03-03 LAB — GLUCOSE, CAPILLARY
GLUCOSE-CAPILLARY: 232 mg/dL — AB (ref 65–99)
Glucose-Capillary: 186 mg/dL — ABNORMAL HIGH (ref 65–99)
Glucose-Capillary: 144 mg/dL — ABNORMAL HIGH (ref 65–99)

## 2017-03-04 ENCOUNTER — Non-Acute Institutional Stay (SKILLED_NURSING_FACILITY): Payer: Medicare Other | Admitting: Gerontology

## 2017-03-04 DIAGNOSIS — R1312 Dysphagia, oropharyngeal phase: Secondary | ICD-10-CM | POA: Diagnosis not present

## 2017-03-04 DIAGNOSIS — E639 Nutritional deficiency, unspecified: Secondary | ICD-10-CM

## 2017-03-04 DIAGNOSIS — Z794 Long term (current) use of insulin: Secondary | ICD-10-CM

## 2017-03-04 DIAGNOSIS — I25119 Atherosclerotic heart disease of native coronary artery with unspecified angina pectoris: Secondary | ICD-10-CM | POA: Diagnosis not present

## 2017-03-04 DIAGNOSIS — I739 Peripheral vascular disease, unspecified: Secondary | ICD-10-CM | POA: Diagnosis not present

## 2017-03-04 DIAGNOSIS — E039 Hypothyroidism, unspecified: Secondary | ICD-10-CM

## 2017-03-04 DIAGNOSIS — H409 Unspecified glaucoma: Secondary | ICD-10-CM | POA: Diagnosis not present

## 2017-03-04 DIAGNOSIS — N183 Chronic kidney disease, stage 3 (moderate): Secondary | ICD-10-CM

## 2017-03-04 DIAGNOSIS — E1122 Type 2 diabetes mellitus with diabetic chronic kidney disease: Secondary | ICD-10-CM

## 2017-03-04 DIAGNOSIS — E785 Hyperlipidemia, unspecified: Secondary | ICD-10-CM

## 2017-03-04 DIAGNOSIS — Z9181 History of falling: Secondary | ICD-10-CM | POA: Diagnosis not present

## 2017-03-04 DIAGNOSIS — J309 Allergic rhinitis, unspecified: Secondary | ICD-10-CM | POA: Diagnosis not present

## 2017-03-04 DIAGNOSIS — I10 Essential (primary) hypertension: Secondary | ICD-10-CM | POA: Diagnosis not present

## 2017-03-04 DIAGNOSIS — E119 Type 2 diabetes mellitus without complications: Secondary | ICD-10-CM | POA: Diagnosis not present

## 2017-03-04 LAB — GLUCOSE, CAPILLARY
Glucose-Capillary: 130 mg/dL — ABNORMAL HIGH (ref 65–99)
Glucose-Capillary: 47 mg/dL — ABNORMAL LOW (ref 65–99)

## 2017-03-05 DIAGNOSIS — E119 Type 2 diabetes mellitus without complications: Secondary | ICD-10-CM | POA: Diagnosis not present

## 2017-03-05 LAB — GLUCOSE, CAPILLARY
GLUCOSE-CAPILLARY: 129 mg/dL — AB (ref 65–99)
Glucose-Capillary: 181 mg/dL — ABNORMAL HIGH (ref 65–99)
Glucose-Capillary: 180 mg/dL — ABNORMAL HIGH (ref 65–99)
Glucose-Capillary: 308 mg/dL — ABNORMAL HIGH (ref 65–99)

## 2017-03-06 DIAGNOSIS — E119 Type 2 diabetes mellitus without complications: Secondary | ICD-10-CM | POA: Diagnosis not present

## 2017-03-06 LAB — GLUCOSE, CAPILLARY: GLUCOSE-CAPILLARY: 179 mg/dL — AB (ref 65–99)

## 2017-03-07 DIAGNOSIS — E119 Type 2 diabetes mellitus without complications: Secondary | ICD-10-CM | POA: Diagnosis not present

## 2017-03-07 LAB — GLUCOSE, CAPILLARY: Glucose-Capillary: 220 mg/dL — ABNORMAL HIGH (ref 65–99)

## 2017-03-08 DIAGNOSIS — E119 Type 2 diabetes mellitus without complications: Secondary | ICD-10-CM | POA: Diagnosis not present

## 2017-03-08 DIAGNOSIS — E639 Nutritional deficiency, unspecified: Secondary | ICD-10-CM | POA: Insufficient documentation

## 2017-03-08 DIAGNOSIS — E785 Hyperlipidemia, unspecified: Secondary | ICD-10-CM | POA: Insufficient documentation

## 2017-03-08 DIAGNOSIS — Z9181 History of falling: Secondary | ICD-10-CM | POA: Insufficient documentation

## 2017-03-08 DIAGNOSIS — J309 Allergic rhinitis, unspecified: Secondary | ICD-10-CM | POA: Insufficient documentation

## 2017-03-08 DIAGNOSIS — I10 Essential (primary) hypertension: Secondary | ICD-10-CM | POA: Insufficient documentation

## 2017-03-08 DIAGNOSIS — H409 Unspecified glaucoma: Secondary | ICD-10-CM | POA: Insufficient documentation

## 2017-03-08 DIAGNOSIS — R1312 Dysphagia, oropharyngeal phase: Secondary | ICD-10-CM | POA: Insufficient documentation

## 2017-03-08 DIAGNOSIS — I25119 Atherosclerotic heart disease of native coronary artery with unspecified angina pectoris: Secondary | ICD-10-CM | POA: Insufficient documentation

## 2017-03-08 DIAGNOSIS — E039 Hypothyroidism, unspecified: Secondary | ICD-10-CM | POA: Insufficient documentation

## 2017-03-08 DIAGNOSIS — I739 Peripheral vascular disease, unspecified: Secondary | ICD-10-CM | POA: Insufficient documentation

## 2017-03-08 LAB — GLUCOSE, CAPILLARY
Glucose-Capillary: 185 mg/dL — ABNORMAL HIGH (ref 65–99)
Glucose-Capillary: 165 mg/dL — ABNORMAL HIGH (ref 65–99)
Glucose-Capillary: 135 mg/dL — ABNORMAL HIGH (ref 65–99)

## 2017-03-08 NOTE — Progress Notes (Signed)
Location:      Place of Service:  SNF (31) Provider:  Toni Arthurs, NP-C  Kirk Ruths., MD  Patient Care Team: Kirk Ruths, MD as PCP - General (Internal Medicine) Teodoro Spray, MD as Consulting Physician (Cardiology)  Extended Emergency Contact Information Primary Emergency Contact: Drake,Karen S Address: [place], [place] [address] Johnnette Litter of [place] Phone: [phone] Work Phone: [phone] Relation: Daughter Secondary Emergency Contact: [place] of [place] Phone: [phone] Relation: Grandson  Code Status:  DNR Goals of care: Advanced Directive information Advanced Directives 12/10/2016  Does Patient Have a Medical Advance Directive? Yes  Type of Paramedic of [place];Living will;Out of facility DNR (pink MOST or yellow form)  Does patient want to make changes to medical advance directive? Yes (Inpatient - patient defers changing a medical advance directive at this time)  Copy of [place] in Chart? No - copy requested  Pre-existing out of facility DNR order (yellow form or pink MOST form) -     Chief Complaint  Patient presents with  . Medical Management of Chronic Issues    HPI:  Pt is a 83 y.o. female seen today for medical management of chronic diseases. Pt has been stable over the past month. Pt has not had any falls. Her appetite is stable. She is voiding well and having regular BMs. She is continent of B&B. She ambulates in the room with a walker. She denies pain. She denies n/v/d/f/c/cp/sob/ha/abd pain/dizziness. VSS. No other complaints.     Past Medical History:  Diagnosis Date  . CAD (coronary artery disease)    status post 2 stents (Dr. Claiborne Billings, Dr. Elisabeth Cara, Dr. Fath-cardiologist) 2008  . DM (diabetes mellitus) ([place])   . Glaucoma   . HTN (hypertension)    Past Surgical History:  Procedure Laterality Date  . CATARACT EXTRACTION     left eye  . CORONARY ANGIOPLASTY WITH STENT PLACEMENT     x 2    Allergies  Allergen Reactions  . Norvasc [Amlodipine Besylate]     Generic Norvasc-->cough  . Celebrex [Celecoxib] Rash    Hives, itching      Allergies as of 03/04/2017      Reactions   Norvasc [amlodipine Besylate]    Generic Norvasc-->cough   Celebrex [celecoxib] Rash   Hives, itching       Medication List       Accurate as of 03/04/17 11:59 PM. Always use your most recent med list.          acetaminophen 325 MG tablet Commonly known as:  TYLENOL Take 650 mg by mouth every 6 (six) hours as needed.   amLODipine 5 MG tablet Commonly known as:  NORVASC Take 5 mg by mouth daily.   aspirin 81 MG tablet Take 1 tablet (81 mg total) by mouth daily. Start on 12/07/2012   cloNIDine 0.1 MG tablet Commonly known as:  CATAPRES Take 0.1 mg by mouth 2 (two) times daily.   insulin aspart 100 UNIT/ML injection Commonly known as:  novoLOG Inject 5 Units into the skin 3 (three) times daily with meals. And sliding scale insulin according to the scale, thank you   insulin glargine 100 UNIT/ML injection Commonly known as:  LANTUS Inject 0.16 mLs (16 Units total) into the skin at bedtime.   isosorbide mononitrate 30 MG 24 hr tablet Commonly known as:  IMDUR TAKE 1 TABLET DAILY  latanoprost 0.005 % ophthalmic solution Commonly known as:  XALATAN Place 1 drop into both eyes at bedtime.   levothyroxine 50 MCG tablet Commonly known as:  SYNTHROID, LEVOTHROID Take 50 mcg by mouth daily.   metFORMIN 500 MG tablet Commonly known as:  GLUCOPHAGE Take 500 mg by mouth 2 (two) times daily with a meal.   metoprolol succinate 100 MG 24 hr tablet Commonly known as:  TOPROL-XL Take 100 mg by mouth 2 (two) times daily. Take with or immediately following a meal.   MUSCLE RUB 10-15 % Crea Apply 1 application topically 2 (two) times daily as needed.   nitroGLYCERIN 0.4 MG SL tablet Commonly known as:   NITROSTAT Place 0.4 mg under the tongue every 5 (five) minutes as needed.   olmesartan-hydrochlorothiazide 40-25 MG tablet Commonly known as:  BENICAR HCT Take 1 tablet by mouth daily.   polymixin-bacitracin 500-10000 UNIT/GM Oint ointment Apply 1 application topically 2 (two) times daily.   potassium chloride 8 MEQ tablet Commonly known as:  KLOR-CON Take 8 mEq by mouth 2 (two) times daily.   rosuvastatin 5 MG tablet Commonly known as:  CRESTOR Take 5 mg by mouth every Monday, Wednesday, and Friday.   senna-docusate 8.6-50 MG tablet Commonly known as:  Senokot-S Take 1 tablet by mouth 2 (two) times daily as needed for constipation.   traMADol 50 MG tablet Commonly known as:  ULTRAM Take 2 tablets (100 mg total) by mouth every 6 (six) hours as needed for moderate pain.       Review of Systems  Constitutional: Negative for activity change, appetite change, chills, diaphoresis, fatigue and fever (questionable).  HENT: Negative for congestion, ear discharge, ear pain, facial swelling, nosebleeds, rhinorrhea, sinus pain, sinus pressure, sneezing, sore throat, trouble swallowing and voice change.   Respiratory: Negative for apnea, cough, choking, chest tightness, shortness of breath and wheezing.   Cardiovascular: Negative for chest pain, palpitations and leg swelling.  Gastrointestinal: Negative for abdominal distention, abdominal pain, constipation, diarrhea, nausea and vomiting.  Genitourinary: Negative for difficulty urinating, dysuria, frequency and urgency.  Musculoskeletal: Positive for arthralgias (typical arthritis). Negative for back pain, gait problem and myalgias.  Skin: Negative for color change, pallor, rash and wound.  Neurological: Positive for weakness. Negative for dizziness, tremors, syncope, speech difficulty, numbness and headaches.  Psychiatric/Behavioral: Negative for agitation and behavioral problems.  All other systems reviewed and are  negative.    There is no immunization history on file for this patient. Pertinent  Health Maintenance Due  Topic Date Due  . FOOT EXAM  08/28/1940  . OPHTHALMOLOGY EXAM  08/28/1940  . DEXA SCAN  08/28/1997  . PNA vac Low Risk Adult (1 of 2 - PCV13) 08/28/1997  . INFLUENZA VACCINE  06/09/2017  . HEMOGLOBIN A1C  06/11/2017   No flowsheet data found. Functional Status Survey:    Vitals:   03/04/17 0420  BP: (!) 179/71  Pulse: 74  Resp: 18  Temp: 97.6 F (36.4 C)  SpO2: 98%   There is no height or weight on file to calculate BMI. Physical Exam  Constitutional: She is oriented to person, place, and time. Vital signs are normal. She appears well-developed and well-nourished. She appears lethargic. She is active and cooperative. She has a sickly appearance. She does not appear ill. No distress.  HENT:  Head: Normocephalic and atraumatic.  Mouth/Throat: Uvula is midline and oropharynx is clear and moist. Mucous membranes are not pale, dry and not cyanotic.  Eyes: Conjunctivae, EOM and lids  are normal. Pupils are equal, round, and reactive to light.  Neck: Trachea normal, normal range of motion and full passive range of motion without pain. Neck supple. No JVD present. No tracheal deviation, no edema and no erythema present. No thyromegaly present.  Cardiovascular: Normal rate, regular rhythm, normal heart sounds, intact distal pulses and normal pulses.  Exam reveals no gallop, no distant heart sounds and no friction rub.   No murmur heard. Pulses:      Dorsalis pedis pulses are 2+ on the right side, and 2+ on the left side.  Pulmonary/Chest: Effort normal. No accessory muscle usage. No respiratory distress. She has no decreased breath sounds. She has no wheezes. She has rhonchi in the right lower field. She has no rales. She exhibits no tenderness.  Abdominal: Soft. Normal appearance and bowel sounds are normal. She exhibits no distension and no ascites. There is no tenderness.   Musculoskeletal: Normal range of motion. She exhibits no edema or tenderness.  Expected osteoarthritis, stiffness  Neurological: She is oriented to person, place, and time. She has normal strength. She appears lethargic. She is not disoriented.  Skin: Skin is warm, dry and intact. No rash noted. She is not diaphoretic. No cyanosis or erythema. No pallor. Nails show no clubbing.  Psychiatric: She has a normal mood and affect. Her speech is normal and behavior is normal. Judgment and thought content normal. Cognition and memory are normal.  Nursing note and vitals reviewed.   Labs reviewed:  Recent Labs  12/14/16 0428 12/24/16 1730 01/01/17 1600  NA 133* 126* 128*  K 3.6 4.0 4.2  CL 103 89* 90*  CO2 _0 GLUCOSE 89 146* 304*  BUN 32* 32* 37*  CREATININE 0.87 0.88 0.95  CALCIUM 8.5* 8.4* 8.3*    Recent Labs  12/10/16 0901 12/24/16 1730 01/01/17 1600  AST 40 17 19  ALT _1 ALKPHOS 61 61 73  BILITOT 2.2* 0.5 0.3  PROT 7.3 5.8* 6.0*  ALBUMIN 4.4 2.5* 2.3*    Recent Labs  12/14/16 0428 12/24/16 1730 01/01/17 1600  WBC 4.1 6.3 8.2  NEUTROABS  --  4.9 6.6*  HGB 12.7 11.1* 10.0*  HCT 38.0 32.7* 30.2*  MCV 93.5 92.2 94.1  PLT 131* 168 312   Lab Results  Component Value Date   TSH 0.980 11/30/2012   Lab Results  Component Value Date   HGBA1C 11.6 (H) 12/12/2016   Lab Results  Component Value Date   CHOL 122 11/30/2012   HDL 57 11/30/2012   LDLCALC 54 11/30/2012   TRIG 57 11/30/2012   CHOLHDL 2.1 11/30/2012    Significant Diagnostic Results in last 30 days:  No results found.  Assessment/Plan 1. Type 2 diabetes mellitus with stage 3 chronic kidney disease, with long-term current use of insulin (HCC)  Stable  Continue Humalog 75-25- 12 units SQ TID  Continue Metformin 500 mg po BID  FSBS AC/HS  2. Hx of falling  Stable   3. Dysphagia, oropharyngeal phase  Stable  Continue CCD diet  Add moisture/ gravy as a side on all  meals  4. Peripheral vascular disease, unspecified ([place])  Stable   5. Atherosclerosis of native coronary artery of native heart with angina pectoris (HCC)  Stable  Continue ASA 81 mg po Q Day  Continue Nitroglycerin 0.4 mg- 1 tablet SL Q 5 minutes x 3 for angina  6. Essential (primary) hypertension  Stable  Continue Amlodipine 5 mg po Q Day  Continue Clonidine 0.1 mg po BID  Continue Isosorbide ER 30 mg po Q Day  Continue Metoprolol ER 100 mg po BID  Continue Olmasartan-HCTZ 40-25 mg- 1 tablet po Q Day  Continue Potassium Chloride 8 meQ 1 tablet po BID  7. Glaucoma of both eyes, unspecified glaucoma type  Stable  Continue Latanoprost 0.005% 1 drop in both eyes at bedtime  8. Hypothyroidism, unspecified type  Stable  Continue levothyroxine 50 mcg po Q Day  9. Allergic rhinitis, unspecified seasonality, unspecified trigger  Stable  Continue Fluticasone 50 mcg- 1 spray each nostril Q Day  10. Nutritional deficiency  Continue Glucerna- 1 can BID between meals  Pro-stat 30 ml po BID  11. Hyperlipidemia  Stable  Continue Rosuvastatin 5 mg po three times/ week  Family/ staff Communication:   Total Time:  Documentation:  Face to Face:  Family/Phone:   Labs/tests ordered:  Cbc, met c  Medication list reviewed and assessed for continued appropriateness. Monthly medication orders reviewed and signed.  Vikki Ports, NP-C Geriatrics [facility] [phone], [place] [address] Cell Phone (Mon-Fri 8am-5pm):  [phone] On Call:  [phone] & follow prompts after 5pm & weekends Office Phone:  [phone] Office Fax:  [phone]

## 2017-03-09 ENCOUNTER — Encounter
Admission: RE | Admit: 2017-03-09 | Discharge: 2017-03-09 | Disposition: A | Discharge status: Home / Self Care | Payer: Medicare Other | Source: Ambulatory Visit | Attending: Internal Medicine | Admitting: Internal Medicine

## 2017-03-09 ENCOUNTER — Other Ambulatory Visit
Admission: RE | Admit: 2017-03-09 | Discharge: 2017-03-09 | Disposition: A | Discharge status: Home / Self Care | Payer: Medicare Other | Source: Ambulatory Visit | Attending: Gerontology | Admitting: Gerontology

## 2017-03-09 DIAGNOSIS — E871 Hypo-osmolality and hyponatremia: Secondary | ICD-10-CM | POA: Insufficient documentation

## 2017-03-09 DIAGNOSIS — E639 Nutritional deficiency, unspecified: Secondary | ICD-10-CM | POA: Insufficient documentation

## 2017-03-09 LAB — CBC WITH DIFFERENTIAL/PLATELET
BASOS ABS: 0 10*3/uL (ref 0–0.1)
Basophils Relative: 1 %
EOS PCT: 5 %
Eosinophils Absolute: 0.3 10*3/uL (ref 0–0.7)
HCT: 34.5 % — ABNORMAL LOW (ref 35.0–47.0)
Hemoglobin: 11.5 g/dL — ABNORMAL LOW (ref 12.0–16.0)
LYMPHS PCT: 23 %
Lymphs Abs: 1.2 10*3/uL (ref 1.0–3.6)
MCH: 32.9 pg (ref 26.0–34.0)
MCHC: 33.3 g/dL (ref 32.0–36.0)
MCV: 98.9 fL (ref 80.0–100.0)
MONO ABS: 0.4 10*3/uL (ref 0.2–0.9)
Monocytes Relative: 7 %
Neutro Abs: 3.3 10*3/uL (ref 1.4–6.5)
Neutrophils Relative %: 64 %
PLATELETS: 170 10*3/uL (ref 150–440)
RBC: 3.49 MIL/uL — ABNORMAL LOW (ref 3.80–5.20)
RDW: 14.6 % — AB (ref 11.5–14.5)
WBC: 5.2 10*3/uL (ref 3.6–11.0)

## 2017-03-09 LAB — COMPREHENSIVE METABOLIC PANEL
ALT: 17 U/L (ref 14–54)
ANION GAP: 8 (ref 5–15)
AST: 17 U/L (ref 15–41)
Albumin: 4 g/dL (ref 3.5–5.0)
Alkaline Phosphatase: 44 U/L (ref 38–126)
BUN: 44 mg/dL — ABNORMAL HIGH (ref 6–20)
CHLORIDE: 100 mmol/L — AB (ref 101–111)
CO2: 30 mmol/L (ref 22–32)
Calcium: 9.5 mg/dL (ref 8.9–10.3)
Creatinine, Ser: 0.95 mg/dL (ref 0.44–1.00)
GFR, EST NON AFRICAN AMERICAN: 53 mL/min — AB (ref >60)
Glucose, Bld: 180 mg/dL — ABNORMAL HIGH (ref 65–99)
POTASSIUM: 3.6 mmol/L (ref 3.5–5.1)
Sodium: 138 mmol/L (ref 135–145)
Total Bilirubin: 0.7 mg/dL (ref 0.3–1.2)
Total Protein: 7.2 g/dL (ref 6.5–8.1)

## 2017-03-09 LAB — GLUCOSE, CAPILLARY
GLUCOSE-CAPILLARY: 153 mg/dL — AB (ref 65–99)
GLUCOSE-CAPILLARY: 129 mg/dL — AB (ref 65–99)
Glucose-Capillary: 208 mg/dL — ABNORMAL HIGH (ref 65–99)

## 2017-03-11 DIAGNOSIS — E871 Hypo-osmolality and hyponatremia: Secondary | ICD-10-CM | POA: Diagnosis not present

## 2017-03-11 LAB — GLUCOSE, CAPILLARY
GLUCOSE-CAPILLARY: 165 mg/dL — AB (ref 65–99)
Glucose-Capillary: 153 mg/dL — ABNORMAL HIGH (ref 65–99)
Glucose-Capillary: 159 mg/dL — ABNORMAL HIGH (ref 65–99)
Glucose-Capillary: 231 mg/dL — ABNORMAL HIGH (ref 65–99)

## 2017-03-12 DIAGNOSIS — E871 Hypo-osmolality and hyponatremia: Secondary | ICD-10-CM | POA: Diagnosis not present

## 2017-03-12 LAB — GLUCOSE, CAPILLARY
GLUCOSE-CAPILLARY: 295 mg/dL — AB (ref 65–99)
Glucose-Capillary: 123 mg/dL — ABNORMAL HIGH (ref 65–99)

## 2017-03-15 DIAGNOSIS — E871 Hypo-osmolality and hyponatremia: Secondary | ICD-10-CM | POA: Diagnosis not present

## 2017-03-15 LAB — GLUCOSE, CAPILLARY
GLUCOSE-CAPILLARY: 151 mg/dL — AB (ref 65–99)
GLUCOSE-CAPILLARY: 124 mg/dL — AB (ref 65–99)
GLUCOSE-CAPILLARY: 228 mg/dL — AB (ref 65–99)
GLUCOSE-CAPILLARY: 218 mg/dL — AB (ref 65–99)
Glucose-Capillary: 118 mg/dL — ABNORMAL HIGH (ref 65–99)
Glucose-Capillary: 114 mg/dL — ABNORMAL HIGH (ref 65–99)
Glucose-Capillary: 229 mg/dL — ABNORMAL HIGH (ref 65–99)
Glucose-Capillary: 94 mg/dL (ref 65–99)
Glucose-Capillary: 187 mg/dL — ABNORMAL HIGH (ref 65–99)
Glucose-Capillary: 159 mg/dL — ABNORMAL HIGH (ref 65–99)
Glucose-Capillary: 126 mg/dL — ABNORMAL HIGH (ref 65–99)
Glucose-Capillary: 231 mg/dL — ABNORMAL HIGH (ref 65–99)

## 2017-03-16 DIAGNOSIS — E871 Hypo-osmolality and hyponatremia: Secondary | ICD-10-CM | POA: Diagnosis not present

## 2017-03-16 LAB — GLUCOSE, CAPILLARY
GLUCOSE-CAPILLARY: 152 mg/dL — AB (ref 65–99)
Glucose-Capillary: 172 mg/dL — ABNORMAL HIGH (ref 65–99)
Glucose-Capillary: 254 mg/dL — ABNORMAL HIGH (ref 65–99)

## 2017-03-17 LAB — GLUCOSE, CAPILLARY
Glucose-Capillary: 145 mg/dL — ABNORMAL HIGH (ref 65–99)
Glucose-Capillary: 196 mg/dL — ABNORMAL HIGH (ref 65–99)

## 2017-03-18 DIAGNOSIS — E871 Hypo-osmolality and hyponatremia: Secondary | ICD-10-CM | POA: Diagnosis not present

## 2017-03-18 LAB — GLUCOSE, CAPILLARY
GLUCOSE-CAPILLARY: 317 mg/dL — AB (ref 65–99)
Glucose-Capillary: 147 mg/dL — ABNORMAL HIGH (ref 65–99)

## 2017-03-19 DIAGNOSIS — E871 Hypo-osmolality and hyponatremia: Secondary | ICD-10-CM | POA: Diagnosis not present

## 2017-03-19 LAB — GLUCOSE, CAPILLARY
GLUCOSE-CAPILLARY: 152 mg/dL — AB (ref 65–99)
GLUCOSE-CAPILLARY: 171 mg/dL — AB (ref 65–99)
GLUCOSE-CAPILLARY: 164 mg/dL — AB (ref 65–99)
Glucose-Capillary: 223 mg/dL — ABNORMAL HIGH (ref 65–99)
Glucose-Capillary: 142 mg/dL — ABNORMAL HIGH (ref 65–99)

## 2017-03-22 DIAGNOSIS — E871 Hypo-osmolality and hyponatremia: Secondary | ICD-10-CM | POA: Diagnosis not present

## 2017-03-22 LAB — GLUCOSE, CAPILLARY
GLUCOSE-CAPILLARY: 122 mg/dL — AB (ref 65–99)
GLUCOSE-CAPILLARY: 172 mg/dL — AB (ref 65–99)
GLUCOSE-CAPILLARY: 144 mg/dL — AB (ref 65–99)
GLUCOSE-CAPILLARY: 128 mg/dL — AB (ref 65–99)
GLUCOSE-CAPILLARY: 90 mg/dL (ref 65–99)
Glucose-Capillary: 157 mg/dL — ABNORMAL HIGH (ref 65–99)
Glucose-Capillary: 134 mg/dL — ABNORMAL HIGH (ref 65–99)
Glucose-Capillary: 149 mg/dL — ABNORMAL HIGH (ref 65–99)
Glucose-Capillary: 202 mg/dL — ABNORMAL HIGH (ref 65–99)
Glucose-Capillary: 180 mg/dL — ABNORMAL HIGH (ref 65–99)
Glucose-Capillary: 169 mg/dL — ABNORMAL HIGH (ref 65–99)

## 2017-03-23 DIAGNOSIS — E871 Hypo-osmolality and hyponatremia: Secondary | ICD-10-CM | POA: Diagnosis not present

## 2017-03-23 LAB — GLUCOSE, CAPILLARY
GLUCOSE-CAPILLARY: 60 mg/dL — AB (ref 65–99)
Glucose-Capillary: 181 mg/dL — ABNORMAL HIGH (ref 65–99)
Glucose-Capillary: 166 mg/dL — ABNORMAL HIGH (ref 65–99)
Glucose-Capillary: 118 mg/dL — ABNORMAL HIGH (ref 65–99)
Glucose-Capillary: 137 mg/dL — ABNORMAL HIGH (ref 65–99)

## 2017-03-24 LAB — GLUCOSE, CAPILLARY
GLUCOSE-CAPILLARY: 216 mg/dL — AB (ref 65–99)
Glucose-Capillary: 136 mg/dL — ABNORMAL HIGH (ref 65–99)
Glucose-Capillary: 156 mg/dL — ABNORMAL HIGH (ref 65–99)

## 2017-03-25 DIAGNOSIS — E871 Hypo-osmolality and hyponatremia: Secondary | ICD-10-CM | POA: Diagnosis not present

## 2017-03-25 LAB — GLUCOSE, CAPILLARY
GLUCOSE-CAPILLARY: 187 mg/dL — AB (ref 65–99)
Glucose-Capillary: 166 mg/dL — ABNORMAL HIGH (ref 65–99)
Glucose-Capillary: 191 mg/dL — ABNORMAL HIGH (ref 65–99)

## 2017-03-26 DIAGNOSIS — E871 Hypo-osmolality and hyponatremia: Secondary | ICD-10-CM | POA: Diagnosis not present

## 2017-03-26 LAB — GLUCOSE, CAPILLARY
GLUCOSE-CAPILLARY: 171 mg/dL — AB (ref 65–99)
Glucose-Capillary: 297 mg/dL — ABNORMAL HIGH (ref 65–99)

## 2017-03-29 DIAGNOSIS — E871 Hypo-osmolality and hyponatremia: Secondary | ICD-10-CM | POA: Diagnosis not present

## 2017-03-29 LAB — GLUCOSE, CAPILLARY
GLUCOSE-CAPILLARY: 88 mg/dL (ref 65–99)
GLUCOSE-CAPILLARY: 152 mg/dL — AB (ref 65–99)
GLUCOSE-CAPILLARY: 253 mg/dL — AB (ref 65–99)
GLUCOSE-CAPILLARY: 102 mg/dL — AB (ref 65–99)
Glucose-Capillary: 262 mg/dL — ABNORMAL HIGH (ref 65–99)
Glucose-Capillary: 113 mg/dL — ABNORMAL HIGH (ref 65–99)
Glucose-Capillary: 214 mg/dL — ABNORMAL HIGH (ref 65–99)
Glucose-Capillary: 101 mg/dL — ABNORMAL HIGH (ref 65–99)
Glucose-Capillary: 220 mg/dL — ABNORMAL HIGH (ref 65–99)
Glucose-Capillary: 148 mg/dL — ABNORMAL HIGH (ref 65–99)
Glucose-Capillary: 160 mg/dL — ABNORMAL HIGH (ref 65–99)
Glucose-Capillary: 175 mg/dL — ABNORMAL HIGH (ref 65–99)

## 2017-03-30 DIAGNOSIS — E871 Hypo-osmolality and hyponatremia: Secondary | ICD-10-CM | POA: Diagnosis not present

## 2017-03-30 LAB — GLUCOSE, CAPILLARY: Glucose-Capillary: 281 mg/dL — ABNORMAL HIGH (ref 65–99)

## 2017-03-31 LAB — GLUCOSE, CAPILLARY
GLUCOSE-CAPILLARY: 234 mg/dL — AB (ref 65–99)
GLUCOSE-CAPILLARY: 168 mg/dL — AB (ref 65–99)
GLUCOSE-CAPILLARY: 188 mg/dL — AB (ref 65–99)
GLUCOSE-CAPILLARY: 147 mg/dL — AB (ref 65–99)
Glucose-Capillary: 200 mg/dL — ABNORMAL HIGH (ref 65–99)

## 2017-04-01 DIAGNOSIS — E871 Hypo-osmolality and hyponatremia: Secondary | ICD-10-CM | POA: Diagnosis not present

## 2017-04-01 LAB — GLUCOSE, CAPILLARY
GLUCOSE-CAPILLARY: 205 mg/dL — AB (ref 65–99)
Glucose-Capillary: 166 mg/dL — ABNORMAL HIGH (ref 65–99)
Glucose-Capillary: 203 mg/dL — ABNORMAL HIGH (ref 65–99)

## 2017-04-02 DIAGNOSIS — E871 Hypo-osmolality and hyponatremia: Secondary | ICD-10-CM | POA: Diagnosis not present

## 2017-04-02 LAB — GLUCOSE, CAPILLARY
Glucose-Capillary: 161 mg/dL — ABNORMAL HIGH (ref 65–99)
Glucose-Capillary: 171 mg/dL — ABNORMAL HIGH (ref 65–99)
Glucose-Capillary: 200 mg/dL — ABNORMAL HIGH (ref 65–99)

## 2017-04-03 DIAGNOSIS — E871 Hypo-osmolality and hyponatremia: Secondary | ICD-10-CM | POA: Diagnosis not present

## 2017-04-03 LAB — GLUCOSE, CAPILLARY
Glucose-Capillary: 182 mg/dL — ABNORMAL HIGH (ref 65–99)
Glucose-Capillary: 218 mg/dL — ABNORMAL HIGH (ref 65–99)

## 2017-04-04 DIAGNOSIS — E871 Hypo-osmolality and hyponatremia: Secondary | ICD-10-CM | POA: Diagnosis not present

## 2017-04-04 LAB — GLUCOSE, CAPILLARY
GLUCOSE-CAPILLARY: 128 mg/dL — AB (ref 65–99)
GLUCOSE-CAPILLARY: 113 mg/dL — AB (ref 65–99)

## 2017-04-05 DIAGNOSIS — E871 Hypo-osmolality and hyponatremia: Secondary | ICD-10-CM | POA: Diagnosis not present

## 2017-04-05 LAB — GLUCOSE, CAPILLARY
Glucose-Capillary: 206 mg/dL — ABNORMAL HIGH (ref 65–99)
Glucose-Capillary: 101 mg/dL — ABNORMAL HIGH (ref 65–99)

## 2017-04-06 DIAGNOSIS — E871 Hypo-osmolality and hyponatremia: Secondary | ICD-10-CM | POA: Diagnosis not present

## 2017-04-06 LAB — GLUCOSE, CAPILLARY
Glucose-Capillary: 195 mg/dL — ABNORMAL HIGH (ref 65–99)
Glucose-Capillary: 203 mg/dL — ABNORMAL HIGH (ref 65–99)

## 2017-04-07 ENCOUNTER — Non-Acute Institutional Stay (SKILLED_NURSING_FACILITY): Payer: Medicare Other | Admitting: Gerontology

## 2017-04-07 DIAGNOSIS — E639 Nutritional deficiency, unspecified: Secondary | ICD-10-CM | POA: Diagnosis not present

## 2017-04-07 DIAGNOSIS — I10 Essential (primary) hypertension: Secondary | ICD-10-CM | POA: Diagnosis not present

## 2017-04-07 DIAGNOSIS — R1312 Dysphagia, oropharyngeal phase: Secondary | ICD-10-CM

## 2017-04-07 DIAGNOSIS — N183 Chronic kidney disease, stage 3 unspecified: Secondary | ICD-10-CM

## 2017-04-07 DIAGNOSIS — H409 Unspecified glaucoma: Secondary | ICD-10-CM | POA: Diagnosis not present

## 2017-04-07 DIAGNOSIS — E871 Hypo-osmolality and hyponatremia: Secondary | ICD-10-CM | POA: Diagnosis not present

## 2017-04-07 DIAGNOSIS — E039 Hypothyroidism, unspecified: Secondary | ICD-10-CM

## 2017-04-07 DIAGNOSIS — Z794 Long term (current) use of insulin: Secondary | ICD-10-CM

## 2017-04-07 DIAGNOSIS — I739 Peripheral vascular disease, unspecified: Secondary | ICD-10-CM | POA: Diagnosis not present

## 2017-04-07 DIAGNOSIS — E785 Hyperlipidemia, unspecified: Secondary | ICD-10-CM | POA: Diagnosis not present

## 2017-04-07 DIAGNOSIS — E1122 Type 2 diabetes mellitus with diabetic chronic kidney disease: Secondary | ICD-10-CM | POA: Diagnosis not present

## 2017-04-07 DIAGNOSIS — I25119 Atherosclerotic heart disease of native coronary artery with unspecified angina pectoris: Secondary | ICD-10-CM

## 2017-04-07 DIAGNOSIS — Z9181 History of falling: Secondary | ICD-10-CM | POA: Diagnosis not present

## 2017-04-07 DIAGNOSIS — J309 Allergic rhinitis, unspecified: Secondary | ICD-10-CM

## 2017-04-07 LAB — GLUCOSE, CAPILLARY
GLUCOSE-CAPILLARY: 115 mg/dL — AB (ref 65–99)
Glucose-Capillary: 234 mg/dL — ABNORMAL HIGH (ref 65–99)
Glucose-Capillary: 197 mg/dL — ABNORMAL HIGH (ref 65–99)

## 2017-04-07 NOTE — Progress Notes (Signed)
Location:      Place of Service:  SNF (31) Provider:  Toni Arthurs, NP-C  Kirk Ruths, MD  Patient Care Team: Kirk Ruths, MD as PCP - General (Internal Medicine) Ubaldo Glassing Javier Docker, MD as Consulting Physician (Cardiology)  Extended Emergency Contact Information Primary Emergency Contact: Drake,Karen S Address: [place], [place] [address] Johnnette Litter of [place] Phone: [phone] Work Phone: [phone] Relation: Daughter Secondary Emergency Contact: [place] of [place] Phone: [phone] Relation: Grandson  Code Status:  DNR Goals of care: Advanced Directive information Advanced Directives 12/10/2016  Does Patient Have a Medical Advance Directive? Yes  Type of Paramedic of [place];Living will;Out of facility DNR (pink MOST or yellow form)  Does patient want to make changes to medical advance directive? Yes (Inpatient - patient defers changing a medical advance directive at this time)  Copy of [place] in Chart? No - copy requested  Pre-existing out of facility DNR order (yellow form or pink MOST form) -     Chief Complaint  Patient presents with  . Medical Management of Chronic Issues    HPI:  Pt is a 83 y.o. female seen today for medical management of chronic diseases. Pt has been stable over the past month. Pt has not had any falls. Her appetite is stable. She is voiding well and having regular BMs. She is continent of B&B. She ambulates in the room with a walker. She denies pain. She denies n/v/d/f/c/cp/sob/ha/abd pain/dizziness. VSS. No other complaints.     Past Medical History:  Diagnosis Date  . CAD (coronary artery disease)    status post 2 stents (Dr. Claiborne Billings, Dr. Elisabeth Cara, Dr. Fath-cardiologist) 2008  . DM (diabetes mellitus) ([place])   . Glaucoma   . HTN (hypertension)    Past Surgical History:  Procedure Laterality Date  . CATARACT EXTRACTION     left eye  . CORONARY ANGIOPLASTY WITH STENT PLACEMENT     x 2    Allergies  Allergen Reactions  . Norvasc [Amlodipine Besylate]     Generic Norvasc-->cough  . Celebrex [Celecoxib] Rash    Hives, itching      Allergies as of 04/07/2017      Reactions   Norvasc [amlodipine Besylate]    Generic Norvasc-->cough   Celebrex [celecoxib] Rash   Hives, itching       Medication List       Accurate as of 04/07/17 10:05 PM. Always use your most recent med list.          acetaminophen 325 MG tablet Commonly known as:  TYLENOL Take 650 mg by mouth every 6 (six) hours as needed.   amLODipine 5 MG tablet Commonly known as:  NORVASC Take 5 mg by mouth daily.   aspirin 81 MG tablet Take 1 tablet (81 mg total) by mouth daily. Start on 12/07/2012   cloNIDine 0.1 MG tablet Commonly known as:  CATAPRES Take 0.1 mg by mouth 2 (two) times daily.   insulin aspart 100 UNIT/ML injection Commonly known as:  novoLOG Inject 5 Units into the skin 3 (three) times daily with meals. And sliding scale insulin according to the scale, thank you   insulin glargine 100 UNIT/ML injection Commonly known as:  LANTUS Inject 0.16 mLs (16 Units total) into the skin at bedtime.   isosorbide mononitrate 30 MG 24 hr tablet Commonly known as:  IMDUR TAKE 1 TABLET  DAILY   latanoprost 0.005 % ophthalmic solution Commonly known as:  XALATAN Place 1 drop into both eyes at bedtime.   levothyroxine 50 MCG tablet Commonly known as:  SYNTHROID, LEVOTHROID Take 50 mcg by mouth daily.   metFORMIN 500 MG tablet Commonly known as:  GLUCOPHAGE Take 500 mg by mouth 2 (two) times daily with a meal.   metoprolol succinate 100 MG 24 hr tablet Commonly known as:  TOPROL-XL Take 100 mg by mouth 2 (two) times daily. Take with or immediately following a meal.   MUSCLE RUB 10-15 % Crea Apply 1 application topically 2 (two) times daily as needed.   nitroGLYCERIN 0.4 MG SL tablet Commonly known as:   NITROSTAT Place 0.4 mg under the tongue every 5 (five) minutes as needed.   olmesartan-hydrochlorothiazide 40-25 MG tablet Commonly known as:  BENICAR HCT Take 1 tablet by mouth daily.   polymixin-bacitracin 500-10000 UNIT/GM Oint ointment Apply 1 application topically 2 (two) times daily.   potassium chloride 8 MEQ tablet Commonly known as:  KLOR-CON Take 8 mEq by mouth 2 (two) times daily.   rosuvastatin 5 MG tablet Commonly known as:  CRESTOR Take 5 mg by mouth every Monday, Wednesday, and Friday.   senna-docusate 8.6-50 MG tablet Commonly known as:  Senokot-S Take 1 tablet by mouth 2 (two) times daily as needed for constipation.   traMADol 50 MG tablet Commonly known as:  ULTRAM Take 2 tablets (100 mg total) by mouth every 6 (six) hours as needed for moderate pain.       Review of Systems  Constitutional: Negative for activity change, appetite change, chills, diaphoresis, fatigue and fever (questionable).  HENT: Negative for congestion, ear discharge, ear pain, facial swelling, nosebleeds, rhinorrhea, sinus pain, sinus pressure, sneezing, sore throat, trouble swallowing and voice change.   Respiratory: Negative for apnea, cough, choking, chest tightness, shortness of breath and wheezing.   Cardiovascular: Negative for chest pain, palpitations and leg swelling.  Gastrointestinal: Negative for abdominal distention, abdominal pain, constipation, diarrhea, nausea and vomiting.  Genitourinary: Negative for difficulty urinating, dysuria, frequency and urgency.  Musculoskeletal: Positive for arthralgias (typical arthritis). Negative for back pain, gait problem and myalgias.  Skin: Negative for color change, pallor, rash and wound.  Neurological: Positive for weakness. Negative for dizziness, tremors, syncope, speech difficulty, numbness and headaches.  Psychiatric/Behavioral: Negative for agitation and behavioral problems.  All other systems reviewed and are  negative.    There is no immunization history on file for this patient. Pertinent  Health Maintenance Due  Topic Date Due  . FOOT EXAM  08/28/1940  . OPHTHALMOLOGY EXAM  08/28/1940  . DEXA SCAN  08/28/1997  . PNA vac Low Risk Adult (1 of 2 - PCV13) 08/28/1997  . INFLUENZA VACCINE  06/09/2017  . HEMOGLOBIN A1C  06/11/2017   No flowsheet data found. Functional Status Survey:    Vitals:   04/01/17 0130  BP: (!) 155/76  Pulse: 83  Resp: 16  Temp: 98 F (36.7 C)  SpO2: 98%   There is no height or weight on file to calculate BMI. Physical Exam  Constitutional: She is oriented to person, place, and time. Vital signs are normal. She appears well-developed and well-nourished. She appears lethargic. She is active and cooperative. She has a sickly appearance. She does not appear ill. No distress.  HENT:  Head: Normocephalic and atraumatic.  Mouth/Throat: Uvula is midline and oropharynx is clear and moist. Mucous membranes are not pale, dry and not cyanotic.  Eyes: Conjunctivae,  EOM and lids are normal. Pupils are equal, round, and reactive to light.  Neck: Trachea normal, normal range of motion and full passive range of motion without pain. Neck supple. No JVD present. No tracheal deviation, no edema and no erythema present. No thyromegaly present.  Cardiovascular: Normal rate, regular rhythm, normal heart sounds, intact distal pulses and normal pulses.  Exam reveals no gallop, no distant heart sounds and no friction rub.   No murmur heard. Pulses:      Dorsalis pedis pulses are 2+ on the right side, and 2+ on the left side.  Pulmonary/Chest: Effort normal. No accessory muscle usage. No respiratory distress. She has no decreased breath sounds. She has no wheezes. She has rhonchi in the right lower field. She has no rales. She exhibits no tenderness.  Abdominal: Soft. Normal appearance and bowel sounds are normal. She exhibits no distension and no ascites. There is no tenderness.   Musculoskeletal: Normal range of motion. She exhibits no edema or tenderness.  Expected osteoarthritis, stiffness  Neurological: She is oriented to person, place, and time. She has normal strength. She appears lethargic. She is not disoriented.  Skin: Skin is warm, dry and intact. No rash noted. She is not diaphoretic. No cyanosis or erythema. No pallor. Nails show no clubbing.  Psychiatric: She has a normal mood and affect. Her speech is normal and behavior is normal. Judgment and thought content normal. Cognition and memory are normal.  Nursing note and vitals reviewed.   Labs reviewed:  Recent Labs  12/24/16 1730 01/01/17 1600 03/09/17 0816  NA 126* 128* 138  K 4.0 4.2 3.6  CL 89* 90* 100*  CO2 30 29 30   GLUCOSE 146* 304* 180*  BUN 32* 37* 44*  CREATININE 0.88 0.95 0.95  CALCIUM 8.4* 8.3* 9.5    Recent Labs  12/24/16 1730 01/01/17 1600 03/09/17 0816  AST 17 19 17   ALT 17 20 17   ALKPHOS 61 73 44  BILITOT 0.5 0.3 0.7  PROT 5.8* 6.0* 7.2  ALBUMIN 2.5* 2.3* 4.0    Recent Labs  12/24/16 1730 01/01/17 1600 03/09/17 0816  WBC 6.3 8.2 5.2  NEUTROABS 4.9 6.6* 3.3  HGB 11.1* 10.0* 11.5*  HCT 32.7* 30.2* 34.5*  MCV 92.2 94.1 98.9  PLT 168 312 170   Lab Results  Component Value Date   TSH 0.980 11/30/2012   Lab Results  Component Value Date   HGBA1C 11.6 (H) 12/12/2016   Lab Results  Component Value Date   CHOL 122 11/30/2012   HDL 57 11/30/2012   LDLCALC 54 11/30/2012   TRIG 57 11/30/2012   CHOLHDL 2.1 11/30/2012    Significant Diagnostic Results in last 30 days:  No results found.  Assessment/Plan 1. Type 2 diabetes mellitus with stage 3 chronic kidney disease, with long-term current use of insulin (HCC)  Stable  Continue Humalog 75-25- 12 units SQ TID  Continue Metformin 500 mg po BID  FSBS AC/HS  2. Hx of falling  Stable   3. Dysphagia, oropharyngeal phase  Stable  Continue CCD diet  Add moisture/ gravy as a side on all  meals  4. Peripheral vascular disease, unspecified ([place])  Stable   5. Atherosclerosis of native coronary artery of native heart with angina pectoris (HCC)  Stable  Continue ASA 81 mg po Q Day  Continue Nitroglycerin 0.4 mg- 1 tablet SL Q 5 minutes x 3 for angina  6. Essential (primary) hypertension  Stable  Continue Amlodipine 10 mg po Q Day  Continue Clonidine 0.1 mg po BID  Continue Isosorbide ER 30 mg po Q Day  Continue Metoprolol ER 100 mg po BID  Continue Olmasartan-HCTZ 40-25 mg- 1 tablet po Q Day  Continue Potassium Chloride 8 meQ 1 tablet po BID  7. Glaucoma of both eyes, unspecified glaucoma type  Stable  Continue Latanoprost 0.005% 1 drop in both eyes at bedtime  8. Hypothyroidism, unspecified type  Stable  Continue levothyroxine 50 mcg po Q Day  9. Allergic rhinitis, unspecified seasonality, unspecified trigger  Stable  Continue Fluticasone 50 mcg- 1 spray each nostril Q Day  10. Nutritional deficiency  Stable  Continue Glucerna- 1 can BID between meals  DC Pro-stat 30 ml po BID  11. Hyperlipidemia  Stable  Continue Rosuvastatin 5 mg po three times/ week  Family/ staff Communication:   Total Time:  Documentation:  Face to Face:  Family/Phone:   Labs/tests ordered:    Medication list reviewed and assessed for continued appropriateness. Monthly medication orders reviewed and signed.  Vikki Ports, NP-C Geriatrics [facility] [phone], [place] [address] Cell Phone (Mon-Fri 8am-5pm):  [phone] On Call:  [phone] & follow prompts after 5pm & weekends Office Phone:  [phone] Office Fax:  [phone]

## 2017-04-08 DIAGNOSIS — E871 Hypo-osmolality and hyponatremia: Secondary | ICD-10-CM | POA: Diagnosis not present

## 2017-04-08 LAB — GLUCOSE, CAPILLARY: GLUCOSE-CAPILLARY: 195 mg/dL — AB (ref 65–99)

## 2017-04-09 ENCOUNTER — Encounter
Admission: RE | Admit: 2017-04-09 | Discharge: 2017-04-09 | Disposition: A | Discharge status: Home / Self Care | Payer: Medicare Other | Source: Ambulatory Visit | Attending: Internal Medicine | Admitting: Internal Medicine

## 2017-04-09 DIAGNOSIS — R300 Dysuria: Secondary | ICD-10-CM | POA: Insufficient documentation

## 2017-04-09 DIAGNOSIS — E1122 Type 2 diabetes mellitus with diabetic chronic kidney disease: Secondary | ICD-10-CM | POA: Insufficient documentation

## 2017-04-13 DIAGNOSIS — R300 Dysuria: Secondary | ICD-10-CM | POA: Diagnosis not present

## 2017-04-13 DIAGNOSIS — E1122 Type 2 diabetes mellitus with diabetic chronic kidney disease: Secondary | ICD-10-CM | POA: Diagnosis present

## 2017-04-13 LAB — LIPID PANEL
Cholesterol: 165 mg/dL (ref 0–200)
HDL: 70 mg/dL (ref >40)
LDL Cholesterol: 76 mg/dL (ref 0–99)
TRIGLYCERIDES: 95 mg/dL (ref <150)
Total CHOL/HDL Ratio: 2.4 RATIO
VLDL: 19 mg/dL (ref 0–40)

## 2017-04-13 LAB — TSH: TSH: 2.686 u[IU]/mL (ref 0.350–4.500)

## 2017-04-14 LAB — HEMOGLOBIN A1C
HEMOGLOBIN A1C: 6.5 % — AB (ref 4.8–5.6)
Mean Plasma Glucose: 140 mg/dL

## 2017-05-02 ENCOUNTER — Other Ambulatory Visit
Admission: RE | Admit: 2017-05-02 | Discharge: 2017-05-02 | Disposition: A | Discharge status: Home / Self Care | Payer: Medicare Other | Source: Skilled Nursing Facility | Attending: Gerontology | Admitting: Gerontology

## 2017-05-02 DIAGNOSIS — E871 Hypo-osmolality and hyponatremia: Secondary | ICD-10-CM | POA: Diagnosis present

## 2017-05-02 DIAGNOSIS — E639 Nutritional deficiency, unspecified: Secondary | ICD-10-CM | POA: Diagnosis present

## 2017-05-02 LAB — URINALYSIS, COMPLETE (UACMP) WITH MICROSCOPIC
BILIRUBIN URINE: NEGATIVE
Bacteria, UA: NONE SEEN
GLUCOSE, UA: NEGATIVE mg/dL
HGB URINE DIPSTICK: NEGATIVE
Ketones, ur: NEGATIVE mg/dL
NITRITE: NEGATIVE
PROTEIN: 30 mg/dL — AB
Specific Gravity, Urine: 1.016 (ref 1.005–1.030)
pH: 5 (ref 5.0–8.0)

## 2017-05-04 LAB — URINE CULTURE

## 2017-05-09 ENCOUNTER — Encounter
Admission: RE | Admit: 2017-05-09 | Discharge: 2017-05-09 | Disposition: A | Discharge status: Home / Self Care | Payer: Medicare Other | Source: Ambulatory Visit | Attending: Internal Medicine | Admitting: Internal Medicine

## 2017-05-13 ENCOUNTER — Non-Acute Institutional Stay (SKILLED_NURSING_FACILITY): Payer: Medicare Other | Admitting: Gerontology

## 2017-05-13 DIAGNOSIS — Z794 Long term (current) use of insulin: Principal | ICD-10-CM

## 2017-05-13 DIAGNOSIS — H409 Unspecified glaucoma: Secondary | ICD-10-CM

## 2017-05-13 DIAGNOSIS — R1312 Dysphagia, oropharyngeal phase: Secondary | ICD-10-CM

## 2017-05-13 DIAGNOSIS — I739 Peripheral vascular disease, unspecified: Secondary | ICD-10-CM

## 2017-05-13 DIAGNOSIS — J309 Allergic rhinitis, unspecified: Secondary | ICD-10-CM

## 2017-05-13 DIAGNOSIS — E784 Other hyperlipidemia: Secondary | ICD-10-CM

## 2017-05-13 DIAGNOSIS — E1122 Type 2 diabetes mellitus with diabetic chronic kidney disease: Secondary | ICD-10-CM

## 2017-05-13 DIAGNOSIS — E639 Nutritional deficiency, unspecified: Secondary | ICD-10-CM | POA: Diagnosis not present

## 2017-05-13 DIAGNOSIS — I10 Essential (primary) hypertension: Secondary | ICD-10-CM

## 2017-05-13 DIAGNOSIS — E039 Hypothyroidism, unspecified: Secondary | ICD-10-CM

## 2017-05-13 DIAGNOSIS — Z9181 History of falling: Secondary | ICD-10-CM

## 2017-05-13 DIAGNOSIS — I25119 Atherosclerotic heart disease of native coronary artery with unspecified angina pectoris: Secondary | ICD-10-CM

## 2017-05-13 DIAGNOSIS — E785 Hyperlipidemia, unspecified: Secondary | ICD-10-CM

## 2017-05-13 DIAGNOSIS — N183 Chronic kidney disease, stage 3 (moderate): Principal | ICD-10-CM

## 2017-05-14 ENCOUNTER — Encounter: Payer: Self-pay | Admitting: Gerontology

## 2017-05-14 NOTE — Progress Notes (Signed)
Location:   [facility] [facility] Service:  SNF [phone]) Provider:  Toni Arthurs, NP-C  Kirk Ruths, MD  Patient Care Team: Kirk Ruths, MD as PCP - General (Internal Medicine) Ubaldo Glassing Javier Docker, MD as Consulting Physician (Cardiology)  Extended Emergency Contact Information Primary Emergency Contact: Drake,Karen S Address: [place], [place] [address] Johnnette Litter of [place] Phone: [phone] Work Phone: [phone] Relation: Daughter Secondary Emergency Contact: [place]u of [place] Phone: [phone] Relation: Grandson  Code Status:  DNR Goals of care: Advanced Directive information Advanced Directives 05/13/2017  Does Patient Have a Medical Advance Directive? Yes  Type of Advance Directive Out of facility DNR (pink MOST or yellow form)  Does patient want to make changes to medical advance directive? No - Patient declined  Copy of [place] in Chart? -  Pre-existing out of facility DNR order (yellow form or pink MOST form) -     Chief Complaint  Patient presents with  . Medical Management of Chronic Issues    Routine Visit    HPI:  Pt is a 83 y.o. female seen today for medical management of chronic diseases. Pt has been stable over the past month. Pt has not had any falls. Her appetite is stable. She is voiding well and having regular BMs. She is continent of [place]. She ambulates in the room with a walker. She denies pain. She denies n/v/d/f/c/cp/sob/ha/abd pain/dizziness. A1C recently checked and is stable. TSH stable. Cholesterol well controlled on minimal statin. Will d/c statin to reduce polypharmacy. VSS. No other complaints.     Past Medical History:  Diagnosis Date  . CAD (coronary artery disease)    status post 2 stents (Dr. Claiborne Billings, Dr. Elisabeth Cara, Dr. Fath-cardiologist) 2008  . DM (diabetes mellitus) ([place])   . Glaucoma   . HTN (hypertension)    Past Surgical History:    Procedure Laterality Date  . CATARACT EXTRACTION     left eye  . CORONARY ANGIOPLASTY WITH STENT PLACEMENT     x 2    Allergies  Allergen Reactions  . Ace Inhibitors     Other reaction(s): Unknown  . Amlodipine     Other reaction(s): Unknown  . Norvasc [Amlodipine Besylate]     Generic Norvasc-->cough  . Simvastatin     Other reaction(s): Unknown  . Celebrex [Celecoxib] Rash    Hives, itching      Allergies as of 05/13/2017      Reactions   Norvasc [amlodipine Besylate]    Generic Norvasc-->cough   Celebrex [celecoxib] Rash   Hives, itching       Medication List       Accurate as of 05/13/17 11:59 PM. Always use your most recent med list.          acetaminophen 325 MG tablet Commonly known as:  TYLENOL Take 650 mg by mouth every 6 (six) hours as needed for moderate pain.   amLODipine 5 MG tablet Commonly known as:  NORVASC Take 10 mg by mouth daily. 2 tabs   aspirin 81 MG chewable tablet Chew 81 mg by mouth daily.   cloNIDine 0.1 MG tablet Commonly known as:  CATAPRES Take 0.1 mg by mouth 2 (two) times daily.   DERMACLOUD Crea Apply liberal amount topically to area of skin irritation as needed. Ok to leave at bedside   dextrose 40 % Gel Commonly known as:  GLUTOSE Take 1 Tube by mouth every 30 (thirty) minutes as needed for low blood sugar. Until BS > 70 and pt is still responsive.   fluticasone 50 MCG/ACT nasal spray Commonly known as:  FLONASE Place 1 spray into both nostrils daily.   GLUCAGON EMERGENCY 1 MG injection Generic drug:  glucagon Inject 1 mg into the muscle once as needed. For hypoglycemia (symptomatic) not responding to oral glucose or snack. May repeat x 1 after 15 mins. Use only if pt is becoming unresponsive.   insulin lispro protamine-lispro (75-25) 100 UNIT/ML Susp injection Commonly known as:  HUMALOG 75/25 MIX Inject 12 Units into the skin 3 (three) times daily with meals.   isosorbide mononitrate 30 MG 24 hr tablet Commonly  known as:  IMDUR TAKE 1 TABLET DAILY   latanoprost 0.005 % ophthalmic solution Commonly known as:  XALATAN Place 1 drop into both eyes at bedtime.   levothyroxine 50 MCG tablet Commonly known as:  SYNTHROID, LEVOTHROID Take 50 mcg by mouth daily.   metFORMIN 500 MG tablet Commonly known as:  GLUCOPHAGE Take 500 mg by mouth 2 (two) times daily with a meal.   metoprolol succinate 100 MG 24 hr tablet Commonly known as:  TOPROL-XL Take 100 mg by mouth 2 (two) times daily. Take with or immediately following a meal.   MUSCLE RUB 10-15 % Crea Apply 1 application topically 2 (two) times daily as needed.   nitroGLYCERIN 0.4 MG SL tablet Commonly known as:  NITROSTAT Place 0.4 mg under the tongue every 5 (five) minutes as needed.   olmesartan-hydrochlorothiazide 40-25 MG tablet Commonly known as:  BENICAR HCT Take 1 tablet by mouth daily.   potassium chloride 8 MEQ tablet Commonly known as:  KLOR-CON Take 8 mEq by mouth 2 (two) times daily.   senna-docusate 8.6-50 MG tablet Commonly known as:  Senokot-S Take 1 tablet by mouth 2 (two) times daily as needed for constipation.   traMADol 50 MG tablet Commonly known as:  ULTRAM Take 2 tablets (100 mg total) by mouth every 6 (six) hours as needed for moderate pain.       Review of Systems  Constitutional: Negative for activity change, appetite change, chills, diaphoresis, fatigue and fever (questionable).  HENT: Negative for congestion, ear discharge, ear pain, facial swelling, nosebleeds, rhinorrhea, sinus pain, sinus pressure, sneezing, sore throat, trouble swallowing and voice change.   Respiratory: Negative for apnea, cough, choking, chest tightness, shortness of breath and wheezing.   Cardiovascular: Negative for chest pain, palpitations and leg swelling.  Gastrointestinal: Negative for abdominal distention, abdominal pain, constipation, diarrhea, nausea and vomiting.  Genitourinary: Negative for difficulty urinating, dysuria,  frequency and urgency.  Musculoskeletal: Positive for arthralgias (typical arthritis). Negative for back pain, gait problem and myalgias.  Skin: Negative for color change, pallor, rash and wound.  Neurological: Positive for weakness. Negative for dizziness, tremors, syncope, speech difficulty, numbness and headaches.  Psychiatric/Behavioral: Negative for agitation and behavioral problems.  All other systems reviewed and are negative.   Immunization History  Administered Date(s) Administered  . Influenza-Unspecified 08/13/2015, 08/12/2016   Pertinent  Health Maintenance Due  Topic Date Due  . FOOT EXAM  08/28/1940  . OPHTHALMOLOGY EXAM  08/28/1940  . DEXA SCAN  08/28/1997  . PNA vac Low Risk Adult (1 of 2 - PCV13) 08/28/1997  . INFLUENZA VACCINE  06/09/2017  . HEMOGLOBIN A1C  10/13/2017   No flowsheet data found. Functional Status Survey:    Vitals:   05/13/17 1437  BP: Marland Kitchen)  150/61  Pulse: 70  Resp: 16  Temp: 98 F (36.7 C)  SpO2: 99%  Weight: 145 lb 3.2 oz (65.9 kg)  Height: 5\' 4"  (1.626 m)   Body mass index is 24.92 kg/m. Physical Exam  Constitutional: She is oriented to person, place, and time. Vital signs are normal. She appears well-developed and well-nourished. She appears lethargic. She is active and cooperative. She has a sickly appearance. She does not appear ill. No distress.  HENT:  Head: Normocephalic and atraumatic.  Mouth/Throat: Uvula is midline and oropharynx is clear and moist. Mucous membranes are not pale, dry and not cyanotic.  Eyes: Conjunctivae, EOM and lids are normal. Pupils are equal, round, and reactive to light.  Neck: Trachea normal, normal range of motion and full passive range of motion without pain. Neck supple. No JVD present. No tracheal deviation, no edema and no erythema present. No thyromegaly present.  Cardiovascular: Normal rate, regular rhythm, normal heart sounds, intact distal pulses and normal pulses.  Exam reveals no gallop, no  distant heart sounds and no friction rub.   No murmur heard. Pulses:      Dorsalis pedis pulses are 2+ on the right side, and 2+ on the left side.  Pulmonary/Chest: Effort normal. No accessory muscle usage. No respiratory distress. She has no decreased breath sounds. She has no wheezes. She has rhonchi in the right lower field. She has no rales. She exhibits no tenderness.  Abdominal: Soft. Normal appearance and bowel sounds are normal. She exhibits no distension and no ascites. There is no tenderness.  Musculoskeletal: Normal range of motion. She exhibits no edema or tenderness.  Expected osteoarthritis, stiffness  Neurological: She is oriented to person, place, and time. She has normal strength. She appears lethargic. She is not disoriented.  Skin: Skin is warm, dry and intact. No rash noted. She is not diaphoretic. No cyanosis or erythema. No pallor. Nails show no clubbing.  Psychiatric: She has a normal mood and affect. Her speech is normal and behavior is normal. Judgment and thought content normal. Cognition and memory are normal.  Nursing note and vitals reviewed.   Labs reviewed:  Recent Labs  12/24/16 1730 01/01/17 1600 03/09/17 0816  NA 126* 128* 138  K 4.0 4.2 3.6  CL 89* 90* 100*  CO2 30 29 30   GLUCOSE 146* 304* 180*  BUN 32* 37* 44*  CREATININE 0.88 0.95 0.95  CALCIUM 8.4* 8.3* 9.5    Recent Labs  12/24/16 1730 01/01/17 1600 03/09/17 0816  AST 17 19 17   ALT 17 20 17   ALKPHOS 61 73 44  BILITOT 0.5 0.3 0.7  PROT 5.8* 6.0* 7.2  ALBUMIN 2.5* 2.3* 4.0    Recent Labs  12/24/16 1730 01/01/17 1600 03/09/17 0816  WBC 6.3 8.2 5.2  NEUTROABS 4.9 6.6* 3.3  HGB 11.1* 10.0* 11.5*  HCT 32.7* 30.2* 34.5*  MCV 92.2 94.1 98.9  PLT 168 312 170   Lab Results  Component Value Date   TSH 2.686 04/13/2017   Lab Results  Component Value Date   HGBA1C 6.5 (H) 04/13/2017   Lab Results  Component Value Date   CHOL 165 04/13/2017   HDL 70 04/13/2017   LDLCALC 76  04/13/2017   TRIG 95 04/13/2017   CHOLHDL 2.4 04/13/2017    Significant Diagnostic Results in last 30 days:  No results found.  Assessment/Plan 1. Type 2 diabetes mellitus with stage 3 chronic kidney disease, with long-term current use of insulin (HCC)  Stable  Continue  Humalog 75-25- 12 units SQ TID  Continue Metformin 500 mg po BID  FSBS AC/HS  2. Hx of falling  Stable   3. Dysphagia, oropharyngeal phase  Stable  Continue CCD diet  Add moisture/ gravy as a side on all meals  4. Peripheral vascular disease, unspecified ([place])  Stable   5. Atherosclerosis of native coronary artery of native heart with angina pectoris (HCC)  Stable  Continue ASA 81 mg po Q Day  Continue Nitroglycerin 0.4 mg- 1 tablet SL Q 5 minutes x 3 for angina  6. Essential (primary) hypertension  Stable  Continue Amlodipine 10 mg po Q Day  Continue Clonidine 0.1 mg po BID  Continue Isosorbide ER 30 mg po Q Day  Continue Metoprolol ER 100 mg po BID  Continue Olmasartan-HCTZ 40-25 mg- 1 tablet po Q Day  Continue Potassium Chloride 8 meQ 1 tablet po BID  7. Glaucoma of both eyes, unspecified glaucoma type  Stable  Continue Latanoprost 0.005% 1 drop in both eyes at bedtime  8. Hypothyroidism, unspecified type  Stable  Continue levothyroxine 50 mcg po Q Day  9. Allergic rhinitis, unspecified seasonality, unspecified trigger  Stable  Continue Fluticasone 50 mcg- 1 spray each nostril Q Day  10. Nutritional deficiency  Stable  Discontinue Glucerna- 1 can BID between meals as PO intake has improved  11. Hyperlipidemia  Stable  Discontinue Rosuvastatin 5 mg po three times/ week  Family/ staff Communication:   Total Time:  Documentation:  Face to Face:  Family/Phone:   Labs/tests ordered:    Medication list reviewed and assessed for continued appropriateness. Monthly medication orders reviewed and signed.  Vikki Ports, NP-C Geriatrics [facility] [phone], [place] [address] Cell Phone (Mon-Fri 8am-5pm):  [phone] On Call:  [phone] & follow prompts after 5pm & weekends Office Phone:  [phone] Office Fax:  [phone]

## 2017-05-20 ENCOUNTER — Non-Acute Institutional Stay (SKILLED_NURSING_FACILITY): Payer: Medicare Other | Admitting: Gerontology

## 2017-05-20 ENCOUNTER — Encounter: Payer: Self-pay | Admitting: Gerontology

## 2017-05-20 DIAGNOSIS — H00014 Hordeolum externum left upper eyelid: Secondary | ICD-10-CM | POA: Diagnosis not present

## 2017-05-20 NOTE — Progress Notes (Signed)
Location:   [facility] [place] [facility] [place] Room Number: [phone] Place of Service:  SNF [phone]) Provider:  Toni Arthurs, NP-C  Kirk Ruths, MD  Patient Care Team: Kirk Ruths, MD as PCP - General (Internal Medicine) Ubaldo Glassing Javier Docker, MD as Consulting Physician (Cardiology)  Extended Emergency Contact Information Primary Emergency Contact: Drake,Karen S Address: [place], [place] [address] Johnnette Litter of [place] Phone: [phone] Work Phone: [phone] Relation: Daughter Secondary Emergency Contact: Bethania of [place] Phone: [phone] Relation: Grandson  Code Status:  DNR Goals of care: Advanced Directive information Advanced Directives 05/20/2017  Does Patient Have a Medical Advance Directive? Yes  Type of Advance Directive Out of facility DNR (pink MOST or yellow form)  Does patient want to make changes to medical advance directive? No - Patient declined  Copy of [place] in Chart? -  Pre-existing out of facility DNR order (yellow form or pink MOST form) -     Chief Complaint  Patient presents with  . Acute Visit    Follow up on eye pain    HPI:  Pt is a 83 y.o. female seen today for an acute visit for area of swelling on the left eye lid. Large, red, warm bump that is sore to touch on the left upper lid. Has been there for a few days. Pt c/o the eye itching and irritated. No blurred vision, no vision changes. No drainage. No conjunctival or scleral redness. Denies trauma. VSS. No other complaints.     Past Medical History:  Diagnosis Date  . CAD (coronary artery disease)    status post 2 stents (Dr. Claiborne Billings, Dr. Elisabeth Cara, Dr. Fath-cardiologist) 2008; MI, 10/2008  . Colonic adenoma    unspecified  . Diabetes mellitus type 2, uncomplicated ([place])   . DM (diabetes mellitus) ([place])   . Glaucoma   . HTN (hypertension)   . Microalbuminuria    diabetic  . Osteoarthritis   .  Thyroid disease    Past Surgical History:  Procedure Laterality Date  . ABDOMINAL HYSTERECTOMY    . APPENDECTOMY  1949  . CATARACT EXTRACTION     left eye  . CERVICAL LAMINECTOMY  1994  . CESAREAN SECTION     x 3  . COLONOSCOPY W/ POLYPECTOMY  11/2007   (RT[facility]) PCI/DES - Stents x 2 sequential- LAD 12/8/200  . CORONARY ANGIOPLASTY WITH STENT PLACEMENT     x 2  . ROTATOR CUFF REPAIR Left 1990    Allergies  Allergen Reactions  . Ace Inhibitors     Other reaction(s): Unknown  . Amlodipine     Other reaction(s): Unknown  . Norvasc [Amlodipine Besylate]     Generic Norvasc-->cough  . Simvastatin     Other reaction(s): Unknown  . Celebrex [Celecoxib] Rash    Hives, itching      Allergies as of 05/20/2017      Reactions   Ace Inhibitors    Other reaction(s): Unknown   Amlodipine    Other reaction(s): Unknown   Norvasc [amlodipine Besylate]    Generic Norvasc-->cough   Simvastatin    Other reaction(s): Unknown   Celebrex [celecoxib] Rash   Hives, itching       Medication List       Accurate as of 05/20/17  2:33 PM. Always use your most recent med list.          acetaminophen 325  MG tablet Commonly known as:  TYLENOL Take 650 mg by mouth every 6 (six) hours as needed for moderate pain.   amLODipine 5 MG tablet Commonly known as:  NORVASC Take 10 mg by mouth daily. 2 tabs   aspirin 81 MG chewable tablet Chew 81 mg by mouth daily.   cloNIDine 0.1 MG tablet Commonly known as:  CATAPRES Take 0.1 mg by mouth 2 (two) times daily.   DERMACLOUD Crea Apply liberal amount topically to area of skin irritation as needed. Ok to leave at bedside   dextrose 40 % Gel Commonly known as:  GLUTOSE Take 1 Tube by mouth every 30 (thirty) minutes as needed for low blood sugar. Until BS > 70 and pt is still responsive.   fluticasone 50 MCG/ACT nasal spray Commonly known as:  FLONASE Place 1 spray into both nostrils daily.   GLUCAGON EMERGENCY 1 MG injection Generic drug:   glucagon Inject 1 mg into the muscle once as needed. For hypoglycemia (symptomatic) not responding to oral glucose or snack. May repeat x 1 after 15 mins. Use only if pt is becoming unresponsive.   insulin lispro protamine-lispro (75-25) 100 UNIT/ML Susp injection Commonly known as:  HUMALOG 75/25 MIX Inject 12 Units into the skin 3 (three) times daily with meals.   isosorbide mononitrate 30 MG 24 hr tablet Commonly known as:  IMDUR TAKE 1 TABLET DAILY   latanoprost 0.005 % ophthalmic solution Commonly known as:  XALATAN Place 1 drop into both eyes at bedtime.   levothyroxine 50 MCG tablet Commonly known as:  SYNTHROID, LEVOTHROID Take 50 mcg by mouth daily before breakfast.   metFORMIN 500 MG tablet Commonly known as:  GLUCOPHAGE Take 500 mg by mouth 2 (two) times daily with a meal.   metoprolol succinate 100 MG 24 hr tablet Commonly known as:  TOPROL-XL Take 100 mg by mouth 2 (two) times daily. Take with or immediately following a meal.   MUSCLE RUB 10-15 % Crea Apply 1 application topically 2 (two) times daily as needed.   nitroGLYCERIN 0.4 MG SL tablet Commonly known as:  NITROSTAT Place 0.4 mg under the tongue every 5 (five) minutes as needed.   olmesartan-hydrochlorothiazide 40-25 MG tablet Commonly known as:  BENICAR HCT Take 1 tablet by mouth daily.   potassium chloride 8 MEQ tablet Commonly known as:  KLOR-CON Take 8 mEq by mouth 2 (two) times daily.   senna-docusate 8.6-50 MG tablet Commonly known as:  Senokot-S Take 1 tablet by mouth 2 (two) times daily as needed for constipation.   traMADol 50 MG tablet Commonly known as:  ULTRAM Take 2 tablets (100 mg total) by mouth every 6 (six) hours as needed for moderate pain.       Review of Systems  Constitutional: Negative for activity change, appetite change, chills, diaphoresis, fatigue and fever.  Eyes: Positive for pain (eye lid), redness (eye lid) and itching. Negative for photophobia, discharge and  visual disturbance.  All other systems reviewed and are negative.   Immunization History  Administered Date(s) Administered  . Influenza-Unspecified 08/13/2015, 08/12/2016   Pertinent  Health Maintenance Due  Topic Date Due  . FOOT EXAM  08/28/1940  . OPHTHALMOLOGY EXAM  08/28/1940  . DEXA SCAN  08/28/1997  . PNA vac Low Risk Adult (1 of 2 - PCV13) 08/28/1997  . INFLUENZA VACCINE  06/09/2017  . HEMOGLOBIN A1C  10/13/2017   No flowsheet data found. Functional Status Survey:    Vitals:   05/20/17 1415  BP: Marland Kitchen)  184/65  Pulse: 74  Resp: 19  Temp: 98 F (36.7 C)  SpO2: 98%  Weight: 145 lb 3.2 oz (65.9 kg)  Height: 5\' 4"  (1.626 m)   Body mass index is 24.92 kg/m. Physical Exam  Constitutional: She appears well-developed and well-nourished. No distress.  HENT:  Head: Normocephalic.  Eyes: Pupils are equal, round, and reactive to light. Conjunctivae and EOM are normal. Left eye exhibits hordeolum. Left eye exhibits no discharge and no exudate. Right conjunctiva is not injected. Left conjunctiva is not injected.  Neck: Normal range of motion. No JVD present.  Pulmonary/Chest: Effort normal.  Musculoskeletal: Normal range of motion.  Neurological: She is alert.  Skin: Skin is warm and dry. She is not diaphoretic.  Psychiatric: She has a normal mood and affect. Her behavior is normal. Judgment and thought content normal.    Labs reviewed:  Recent Labs  12/24/16 1730 01/01/17 1600 03/09/17 0816  NA 126* 128* 138  K 4.0 4.2 3.6  CL 89* 90* 100*  CO2 30 29 30   GLUCOSE 146* 304* 180*  BUN 32* 37* 44*  CREATININE 0.88 0.95 0.95  CALCIUM 8.4* 8.3* 9.5    Recent Labs  12/24/16 1730 01/01/17 1600 03/09/17 0816  AST 17 19 17   ALT 17 20 17   ALKPHOS 61 73 44  BILITOT 0.5 0.3 0.7  PROT 5.8* 6.0* 7.2  ALBUMIN 2.5* 2.3* 4.0    Recent Labs  12/24/16 1730 01/01/17 1600 03/09/17 0816  WBC 6.3 8.2 5.2  NEUTROABS 4.9 6.6* 3.3  HGB 11.1* 10.0* 11.5*  HCT 32.7*  30.2* 34.5*  MCV 92.2 94.1 98.9  PLT 168 312 170   Lab Results  Component Value Date   TSH 2.686 04/13/2017   Lab Results  Component Value Date   HGBA1C 6.5 (H) 04/13/2017   Lab Results  Component Value Date   CHOL 165 04/13/2017   HDL 70 04/13/2017   LDLCALC 76 04/13/2017   TRIG 95 04/13/2017   CHOLHDL 2.4 04/13/2017    Significant Diagnostic Results in last 30 days:  No results found.  Assessment/Plan 1. Hordeolum externum of left upper eyelid  Clean eye with baby shampoo and warm water BID x 7 days  Very warm, moist compresses to the left eye TID x 7 days  Erythromycin ointment 0.5% - 1/4 in ribbon in left eye TID x 7 days  Family/ staff Communication:   Total Time:  Documentation:  Face to Face:  Family/Phone:   Labs/tests ordered:    Medication list reviewed and assessed for continued appropriateness.  Vikki Ports, NP-C Geriatrics [facility] [phone], [place] [address] Cell Phone (Mon-Fri 8am-5pm):  [phone] On Call:  [phone] & follow prompts after 5pm & weekends Office Phone:  [phone] Office Fax:  [phone]

## 2017-06-09 ENCOUNTER — Encounter
Admission: RE | Admit: 2017-06-09 | Discharge: 2017-06-09 | Disposition: A | Discharge status: Home / Self Care | Payer: Medicare Other | Source: Ambulatory Visit | Attending: Internal Medicine | Admitting: Internal Medicine

## 2017-06-16 ENCOUNTER — Encounter: Payer: Self-pay | Admitting: Gerontology

## 2017-06-16 ENCOUNTER — Non-Acute Institutional Stay (SKILLED_NURSING_FACILITY): Payer: Medicare Other | Admitting: Gerontology

## 2017-06-16 DIAGNOSIS — I739 Peripheral vascular disease, unspecified: Secondary | ICD-10-CM | POA: Diagnosis not present

## 2017-06-16 DIAGNOSIS — N183 Chronic kidney disease, stage 3 unspecified: Secondary | ICD-10-CM

## 2017-06-16 DIAGNOSIS — E639 Nutritional deficiency, unspecified: Secondary | ICD-10-CM | POA: Diagnosis not present

## 2017-06-16 DIAGNOSIS — E785 Hyperlipidemia, unspecified: Secondary | ICD-10-CM | POA: Diagnosis not present

## 2017-06-16 DIAGNOSIS — J309 Allergic rhinitis, unspecified: Secondary | ICD-10-CM

## 2017-06-16 DIAGNOSIS — H409 Unspecified glaucoma: Secondary | ICD-10-CM

## 2017-06-16 DIAGNOSIS — R1312 Dysphagia, oropharyngeal phase: Secondary | ICD-10-CM | POA: Diagnosis not present

## 2017-06-16 DIAGNOSIS — I10 Essential (primary) hypertension: Secondary | ICD-10-CM | POA: Diagnosis not present

## 2017-06-16 DIAGNOSIS — Z9181 History of falling: Secondary | ICD-10-CM | POA: Diagnosis not present

## 2017-06-16 DIAGNOSIS — I25119 Atherosclerotic heart disease of native coronary artery with unspecified angina pectoris: Secondary | ICD-10-CM | POA: Diagnosis not present

## 2017-06-16 DIAGNOSIS — E1122 Type 2 diabetes mellitus with diabetic chronic kidney disease: Secondary | ICD-10-CM

## 2017-06-16 DIAGNOSIS — E039 Hypothyroidism, unspecified: Secondary | ICD-10-CM | POA: Diagnosis not present

## 2017-06-16 DIAGNOSIS — Z794 Long term (current) use of insulin: Secondary | ICD-10-CM

## 2017-06-16 NOTE — Progress Notes (Signed)
Location:   [facility] Room Number: [phone] Place of Service:  SNF [phone]) Provider:  Toni Arthurs, NP-C  Kirk Ruths, MD  Patient Care Team: Kirk Ruths, MD as PCP - General (Internal Medicine) Ubaldo Glassing Javier Docker, MD as Consulting Physician (Cardiology)  Extended Emergency Contact Information Primary Emergency Contact: Drake,Karen S Address: [place], [place] [address] Johnnette Litter of [place] Phone: [phone] Work Phone: [phone] Relation: Daughter Secondary Emergency Contact: [place] of [place] Phone: [phone] Relation: Grandson  Code Status:  DNR Goals of care: Advanced Directive information Advanced Directives 06/16/2017  Does Patient Have a Medical Advance Directive? Yes  Type of Advance Directive Out of facility DNR (pink MOST or yellow form)  Does patient want to make changes to medical advance directive? No - Patient declined  Copy of [place] in Chart? -  Pre-existing out of facility DNR order (yellow form or pink MOST form) -     Chief Complaint  Patient presents with  . Medical Management of Chronic Issues    Routine Visit    HPI:  Pt is a 83 y.o. female seen today for medical management of chronic diseases. Pt has been stable over the past month. Pt has not had any falls. Her appetite is stable. She is voiding well and having regular BMs. She is continent of B&B. She ambulates in the room with a walker. She denies pain. She denies n/v/d/f/c/cp/sob/ha/abd pain/dizziness. A1C recently checked and is stable. TSH stable. VSS. No other complaints.     Past Medical History:  Diagnosis Date  . CAD (coronary artery disease)    status post 2 stents (Dr. Claiborne Billings, Dr. Elisabeth Cara, Dr. Fath-cardiologist) 2008; MI, 10/2008  . Colonic adenoma    unspecified  . Diabetes mellitus type 2, uncomplicated ([place])   . DM (diabetes mellitus) ([place])   . Glaucoma   . HTN  (hypertension)   . Microalbuminuria    diabetic  . Osteoarthritis   . Thyroid disease    Past Surgical History:  Procedure Laterality Date  . ABDOMINAL HYSTERECTOMY    . APPENDECTOMY  1949  . CATARACT EXTRACTION     left eye  . CERVICAL LAMINECTOMY  1994  . CESAREAN SECTION     x 3  . COLONOSCOPY W/ POLYPECTOMY  11/2007   (RTE) PCI/DES - Stents x 2 sequential- LAD 12/8/200  . CORONARY ANGIOPLASTY WITH STENT PLACEMENT     x 2  . ROTATOR CUFF REPAIR Left 1990    Allergies  Allergen Reactions  . Ace Inhibitors     Other reaction(s): Unknown  . Amlodipine     Other reaction(s): Unknown  . Norvasc [Amlodipine Besylate]     Generic Norvasc-->cough  . Simvastatin     Other reaction(s): Unknown  . Celebrex [Celecoxib] Rash    Hives, itching      Allergies as of 06/16/2017      Reactions   Ace Inhibitors    Other reaction(s): Unknown   Amlodipine    Other reaction(s): Unknown   Norvasc [amlodipine Besylate]    Generic Norvasc-->cough   Simvastatin    Other reaction(s): Unknown   Celebrex [celecoxib] Rash   Hives, itching       Medication List       Accurate as of 06/16/17 10:09 AM. Always use your most recent med list.  acetaminophen 325 MG tablet Commonly known as:  TYLENOL Take 650 mg by mouth every 6 (six) hours as needed for moderate pain.   amLODipine 5 MG tablet Commonly known as:  NORVASC Take 10 mg by mouth daily. 2 tabs   aspirin 81 MG chewable tablet Chew 81 mg by mouth daily.   cloNIDine 0.1 MG tablet Commonly known as:  CATAPRES Take 0.1 mg by mouth 2 (two) times daily.   DERMACLOUD Crea Apply liberal amount topically to area of skin irritation as needed. Ok to leave at bedside   dextrose 40 % Gel Commonly known as:  GLUTOSE Take 1 Tube by mouth every 30 (thirty) minutes as needed for low blood sugar. Until BS > 70 and pt is still responsive.   fluticasone 50 MCG/ACT nasal spray Commonly known as:  FLONASE Place 1 spray into  both nostrils daily.   GLUCAGON EMERGENCY 1 MG injection Generic drug:  glucagon Inject 1 mg into the muscle once as needed. For hypoglycemia (symptomatic) not responding to oral glucose or snack. May repeat x 1 after 15 mins. Use only if pt is becoming unresponsive.   insulin lispro protamine-lispro (75-25) 100 UNIT/ML Susp injection Commonly known as:  HUMALOG 75/25 MIX Inject 12 Units into the skin 3 (three) times daily with meals.   isosorbide mononitrate 30 MG 24 hr tablet Commonly known as:  IMDUR TAKE 1 TABLET DAILY   latanoprost 0.005 % ophthalmic solution Commonly known as:  XALATAN Place 1 drop into both eyes at bedtime.   levothyroxine 50 MCG tablet Commonly known as:  SYNTHROID, LEVOTHROID Take 50 mcg by mouth daily before breakfast.   metFORMIN 500 MG tablet Commonly known as:  GLUCOPHAGE Take 500 mg by mouth 2 (two) times daily with a meal.   metoprolol succinate 100 MG 24 hr tablet Commonly known as:  TOPROL-XL Take 100 mg by mouth 2 (two) times daily. Take with or immediately following a meal.   MUSCLE RUB 10-15 % Crea Apply 1 application topically 2 (two) times daily as needed.   nitroGLYCERIN 0.4 MG SL tablet Commonly known as:  NITROSTAT Place 0.4 mg under the tongue every 5 (five) minutes as needed.   olmesartan-hydrochlorothiazide 40-25 MG tablet Commonly known as:  BENICAR HCT Take 1 tablet by mouth daily.   potassium chloride 8 MEQ tablet Commonly known as:  KLOR-CON Take 8 mEq by mouth 2 (two) times daily. Give with food. Do not crush   senna-docusate 8.6-50 MG tablet Commonly known as:  Senokot-S Take 1 tablet by mouth 2 (two) times daily as needed for constipation.   traMADol 50 MG tablet Commonly known as:  ULTRAM Take 2 tablets (100 mg total) by mouth every 6 (six) hours as needed for moderate pain.       Review of Systems  Constitutional: Negative for activity change, appetite change, chills, diaphoresis, fatigue and fever  (questionable).  HENT: Negative for congestion, ear discharge, ear pain, facial swelling, nosebleeds, rhinorrhea, sinus pain, sinus pressure, sneezing, sore throat, trouble swallowing and voice change.   Respiratory: Negative for apnea, cough, choking, chest tightness, shortness of breath and wheezing.   Cardiovascular: Negative for chest pain, palpitations and leg swelling.  Gastrointestinal: Negative for abdominal distention, abdominal pain, constipation, diarrhea, nausea and vomiting.  Genitourinary: Negative for difficulty urinating, dysuria, frequency and urgency.  Musculoskeletal: Positive for arthralgias (typical arthritis). Negative for back pain, gait problem and myalgias.  Skin: Negative for color change, pallor, rash and wound.  Neurological: Positive for weakness.  Negative for dizziness, tremors, syncope, speech difficulty, numbness and headaches.  Psychiatric/Behavioral: Negative for agitation and behavioral problems.  All other systems reviewed and are negative.   Immunization History  Administered Date(s) Administered  . Influenza-Unspecified 08/13/2015, 08/12/2016   Pertinent  Health Maintenance Due  Topic Date Due  . FOOT EXAM  08/28/1940  . OPHTHALMOLOGY EXAM  08/28/1940  . DEXA SCAN  08/28/1997  . PNA vac Low Risk Adult (1 of 2 - PCV13) 08/28/1997  . INFLUENZA VACCINE  06/09/2017  . HEMOGLOBIN A1C  10/13/2017   No flowsheet data found. Functional Status Survey:    Vitals:   06/16/17 0953  BP: (!) 181/75  Pulse: 75  Resp: 20  Temp: 97.8 F (36.6 C)  SpO2: 99%  Weight: 146 lb (66.2 kg)  Height: 5\' 4"  (1.626 m)   Body mass index is 25.06 kg/m. Physical Exam  Constitutional: She is oriented to person, place, and time. Vital signs are normal. She appears well-developed and well-nourished. She is active and cooperative. She does not appear ill. No distress.  HENT:  Head: Normocephalic and atraumatic.  Mouth/Throat: Uvula is midline and oropharynx is clear  and moist. Mucous membranes are not pale, dry and not cyanotic.  Eyes: Pupils are equal, round, and reactive to light. Conjunctivae, EOM and lids are normal.  Neck: Trachea normal, normal range of motion and full passive range of motion without pain. Neck supple. No JVD present. No tracheal deviation, no edema and no erythema present. No thyromegaly present.  Cardiovascular: Normal rate, regular rhythm, normal heart sounds, intact distal pulses and normal pulses.  Exam reveals no gallop, no distant heart sounds and no friction rub.   No murmur heard. Pulses:      Dorsalis pedis pulses are 2+ on the right side, and 2+ on the left side.  Pulmonary/Chest: Effort normal. No accessory muscle usage. No respiratory distress. She has no decreased breath sounds. She has no wheezes. She has no rhonchi. She has no rales. She exhibits no tenderness.  Abdominal: Soft. Normal appearance and bowel sounds are normal. She exhibits no distension and no ascites. There is no tenderness.  Musculoskeletal: Normal range of motion. She exhibits no edema or tenderness.  Expected osteoarthritis, stiffness  Neurological: She is alert and oriented to person, place, and time. She has normal strength. She is not disoriented.  Skin: Skin is warm, dry and intact. No rash noted. She is not diaphoretic. No cyanosis or erythema. No pallor. Nails show no clubbing.  Psychiatric: She has a normal mood and affect. Her speech is normal and behavior is normal. Judgment and thought content normal. Cognition and memory are normal.  Nursing note and vitals reviewed.   Labs reviewed:  Recent Labs  12/24/16 1730 01/01/17 1600 03/09/17 0816  NA 126* 128* 138  K 4.0 4.2 3.6  CL 89* 90* 100*  CO2 30 29 30   GLUCOSE 146* 304* 180*  BUN 32* 37* 44*  CREATININE 0.88 0.95 0.95  CALCIUM 8.4* 8.3* 9.5    Recent Labs  12/24/16 1730 01/01/17 1600 03/09/17 0816  AST 17 19 17   ALT 17 20 17   ALKPHOS 61 73 44  BILITOT 0.5 0.3 0.7    PROT 5.8* 6.0* 7.2  ALBUMIN 2.5* 2.3* 4.0    Recent Labs  12/24/16 1730 01/01/17 1600 03/09/17 0816  WBC 6.3 8.2 5.2  NEUTROABS 4.9 6.6* 3.3  HGB 11.1* 10.0* 11.5*  HCT 32.7* 30.2* 34.5*  MCV 92.2 94.1 98.9  PLT 168 312  170   Lab Results  Component Value Date   TSH 2.686 04/13/2017   Lab Results  Component Value Date   HGBA1C 6.5 (H) 04/13/2017   Lab Results  Component Value Date   CHOL 165 04/13/2017   HDL 70 04/13/2017   LDLCALC 76 04/13/2017   TRIG 95 04/13/2017   CHOLHDL 2.4 04/13/2017    Significant Diagnostic Results in last 30 days:  No results found.  Assessment/Plan 1. Type 2 diabetes mellitus with stage 3 chronic kidney disease, with long-term current use of insulin (HCC)  Stable  Continue Humalog 75-25- 12 units SQ TID  Continue Metformin 500 mg po BID  FSBS AC/HS  2. Hx of falling  Stable   3. Dysphagia, oropharyngeal phase  Stable  Continue CCD diet  Add moisture/ gravy as a side on all meals  4. Peripheral vascular disease, unspecified ([place])  Stable   5. Atherosclerosis of native coronary artery of native heart with angina pectoris (HCC)  Stable  Continue ASA 81 mg po Q Day  Continue Nitroglycerin 0.4 mg- 1 tablet SL Q 5 minutes x 3 for angina  6. Essential (primary) hypertension  Stable  Continue Amlodipine 10 mg po Q Day  Continue Clonidine 0.1 mg po BID  Continue Isosorbide ER 30 mg po Q Day  Continue Metoprolol ER 100 mg po BID  Continue Olmasartan-HCTZ 40-25 mg- 1 tablet po Q Day  Continue Potassium Chloride 8 meQ 1 tablet po BID  7. Glaucoma of both eyes, unspecified glaucoma type  Stable  Continue Latanoprost 0.005% 1 drop in both eyes at bedtime  8. Hypothyroidism, unspecified type  Stable  Continue levothyroxine 50 mcg po Q Day  9. Allergic rhinitis, unspecified seasonality, unspecified trigger  Stable  Continue Fluticasone 50 mcg- 1 spray each nostril Q Day  10. Nutritional  deficiency  Stable  11. Hyperlipidemia  Stable  No further medication management necessary  Family/ staff Communication:   Total Time:  Documentation:  Face to Face:  Family/Phone:   Labs/tests ordered:    Medication list reviewed and assessed for continued appropriateness. Monthly medication orders reviewed and signed.  Vikki Ports, NP-C Geriatrics [facility] [phone], [place] [address] Cell Phone (Mon-Fri 8am-5pm):  [phone] On Call:  [phone] & follow prompts after 5pm & weekends Office Phone:  [phone] Office Fax:  [phone]

## 2017-07-10 ENCOUNTER — Encounter: Admission: RE | Admit: 2017-07-10 | Payer: Medicare Other | Source: Ambulatory Visit | Admitting: Internal Medicine

## 2017-07-20 ENCOUNTER — Non-Acute Institutional Stay (SKILLED_NURSING_FACILITY): Payer: Medicare Other | Admitting: Gerontology

## 2017-07-20 ENCOUNTER — Encounter: Payer: Self-pay | Admitting: Gerontology

## 2017-07-20 DIAGNOSIS — Z794 Long term (current) use of insulin: Secondary | ICD-10-CM

## 2017-07-20 DIAGNOSIS — I25119 Atherosclerotic heart disease of native coronary artery with unspecified angina pectoris: Secondary | ICD-10-CM

## 2017-07-20 DIAGNOSIS — J309 Allergic rhinitis, unspecified: Secondary | ICD-10-CM | POA: Diagnosis not present

## 2017-07-20 DIAGNOSIS — E785 Hyperlipidemia, unspecified: Secondary | ICD-10-CM | POA: Diagnosis not present

## 2017-07-20 DIAGNOSIS — I10 Essential (primary) hypertension: Secondary | ICD-10-CM | POA: Diagnosis not present

## 2017-07-20 DIAGNOSIS — I739 Peripheral vascular disease, unspecified: Secondary | ICD-10-CM | POA: Diagnosis not present

## 2017-07-20 DIAGNOSIS — E639 Nutritional deficiency, unspecified: Secondary | ICD-10-CM

## 2017-07-20 DIAGNOSIS — E1122 Type 2 diabetes mellitus with diabetic chronic kidney disease: Secondary | ICD-10-CM | POA: Diagnosis not present

## 2017-07-20 DIAGNOSIS — N183 Chronic kidney disease, stage 3 (moderate): Secondary | ICD-10-CM

## 2017-07-20 DIAGNOSIS — R1312 Dysphagia, oropharyngeal phase: Secondary | ICD-10-CM

## 2017-07-20 DIAGNOSIS — H409 Unspecified glaucoma: Secondary | ICD-10-CM | POA: Diagnosis not present

## 2017-07-20 DIAGNOSIS — E039 Hypothyroidism, unspecified: Secondary | ICD-10-CM

## 2017-07-20 DIAGNOSIS — Z9181 History of falling: Secondary | ICD-10-CM | POA: Diagnosis not present

## 2017-07-20 NOTE — Progress Notes (Signed)
Location:   [facility] Room Number: [phone] Place of Service:  SNF [phone]) Provider:  Toni Arthurs, NP-C  Kirk Ruths, MD  Patient Care Team: Kirk Ruths, MD as PCP - General (Internal Medicine) Ubaldo Glassing Javier Docker, MD as Consulting Physician (Cardiology)  Extended Emergency Contact Information Primary Emergency Contact: Drake,Karen S Address: [place], [place] [address] Johnnette Litter of [place] Phone: [phone] Work Phone: [phone] Relation: Daughter Secondary Emergency Contact: [place] of [place] Phone: [phone] Relation: Grandson  Code Status:  DNR Goals of care: Advanced Directive information Advanced Directives 07/20/2017  Does Patient Have a Medical Advance Directive? Yes  Type of Advance Directive Out of facility DNR (pink MOST or yellow form)  Does patient want to make changes to medical advance directive? No - Patient declined  Copy of [place] in Chart? -  Pre-existing out of facility DNR order (yellow form or pink MOST form) -     Chief Complaint  Patient presents with  . Medical Management of Chronic Issues    Routine Visit    HPI:  Pt is a 83 y.o. female seen today for medical management of chronic diseases. Pt has been stable over the past month. Pt has not had any falls. Her appetite is stable. She is voiding well and having regular BMs. She is continent of B&B. She ambulates in the room with a walker. She denies pain. She denies n/v/d/f/c/cp/sob/ha/abd pain/dizziness. TSH stable. VSS. No other complaints.     Past Medical History:  Diagnosis Date  . CAD (coronary artery disease)    status post 2 stents (Dr. Claiborne Billings, Dr. Elisabeth Cara, Dr. Fath-cardiologist) 2008; MI, 10/2008  . Colonic adenoma    unspecified  . Diabetes mellitus type 2, uncomplicated ([place])   . DM (diabetes mellitus) ([place])   . Glaucoma   . HTN (hypertension)   . Microalbuminuria     diabetic  . Osteoarthritis   . Thyroid disease    Past Surgical History:  Procedure Laterality Date  . ABDOMINAL HYSTERECTOMY    . APPENDECTOMY  1949  . CATARACT EXTRACTION     left eye  . CERVICAL LAMINECTOMY  1994  . CESAREAN SECTION     x 3  . COLONOSCOPY W/ POLYPECTOMY  11/2007   (RTE) PCI/DES - Stents x 2 sequential- LAD 12/8/200  . CORONARY ANGIOPLASTY WITH STENT PLACEMENT     x 2  . ROTATOR CUFF REPAIR Left 1990    Allergies  Allergen Reactions  . Ace Inhibitors     Other reaction(s): Unknown  . Amlodipine     Other reaction(s): Unknown  . Norvasc [Amlodipine Besylate]     Generic Norvasc-->cough  . Simvastatin     Other reaction(s): Unknown  . Celebrex [Celecoxib] Rash    Hives, itching      Allergies as of 07/20/2017      Reactions   Ace Inhibitors    Other reaction(s): Unknown   Amlodipine    Other reaction(s): Unknown   Norvasc [amlodipine Besylate]    Generic Norvasc-->cough   Simvastatin    Other reaction(s): Unknown   Celebrex [celecoxib] Rash   Hives, itching       Medication List       Accurate as of 07/20/17 12:19 PM. Always use your most recent med list.          acetaminophen 325 MG tablet Commonly  known as:  TYLENOL Take 650 mg by mouth every 6 (six) hours as needed for moderate pain.   amLODipine 10 MG tablet Commonly known as:  NORVASC Take 10 mg by mouth daily.   aspirin 81 MG chewable tablet Chew 81 mg by mouth daily.   cloNIDine 0.1 MG tablet Commonly known as:  CATAPRES Take 0.1 mg by mouth 2 (two) times daily.   DERMACLOUD Crea Apply liberal amount topically to area of skin irritation as needed. Ok to leave at bedside   dextrose 40 % Gel Commonly known as:  GLUTOSE Take 1 Tube by mouth every 30 (thirty) minutes as needed for low blood sugar. Until BS > 70 and pt is still responsive.   fluticasone 50 MCG/ACT nasal spray Commonly known as:  FLONASE Place 1 spray into both nostrils daily.   GLUCAGON EMERGENCY  1 MG injection Generic drug:  glucagon Inject 1 mg into the muscle once as needed. For hypoglycemia (symptomatic) not responding to oral glucose or snack. May repeat x 1 after 15 mins. Use only if pt is becoming unresponsive.   insulin lispro protamine-lispro (75-25) 100 UNIT/ML Susp injection Commonly known as:  HUMALOG 75/25 MIX Inject 12 Units into the skin 3 (three) times daily with meals.   isosorbide mononitrate 30 MG 24 hr tablet Commonly known as:  IMDUR TAKE 1 TABLET DAILY   latanoprost 0.005 % ophthalmic solution Commonly known as:  XALATAN Place 1 drop into both eyes at bedtime.   levothyroxine 50 MCG tablet Commonly known as:  SYNTHROID, LEVOTHROID Take 50 mcg by mouth daily before breakfast.   metFORMIN 500 MG tablet Commonly known as:  GLUCOPHAGE Take 500 mg by mouth 2 (two) times daily with a meal.   metoprolol succinate 100 MG 24 hr tablet Commonly known as:  TOPROL-XL Take 100 mg by mouth 2 (two) times daily. Take with or immediately following a meal.   MUSCLE RUB 10-15 % Crea Apply 1 application topically 2 (two) times daily as needed.   nitroGLYCERIN 0.4 MG SL tablet Commonly known as:  NITROSTAT Place 0.4 mg under the tongue every 5 (five) minutes as needed.   olmesartan-hydrochlorothiazide 40-25 MG tablet Commonly known as:  BENICAR HCT Take 1 tablet by mouth daily.   potassium chloride 8 MEQ tablet Commonly known as:  KLOR-CON Take 8 mEq by mouth 2 (two) times daily. Give with food. Do not crush   senna-docusate 8.6-50 MG tablet Commonly known as:  Senokot-S Take 1 tablet by mouth 2 (two) times daily as needed for constipation.   traMADol 50 MG tablet Commonly known as:  ULTRAM Take 2 tablets (100 mg total) by mouth every 6 (six) hours as needed for moderate pain.       Review of Systems  Constitutional: Negative for activity change, appetite change, chills, diaphoresis, fatigue and fever (questionable).  HENT: Negative for congestion, ear  discharge, ear pain, facial swelling, nosebleeds, rhinorrhea, sinus pain, sinus pressure, sneezing, sore throat, trouble swallowing and voice change.   Respiratory: Negative for apnea, cough, choking, chest tightness, shortness of breath and wheezing.   Cardiovascular: Negative for chest pain, palpitations and leg swelling.  Gastrointestinal: Negative for abdominal distention, abdominal pain, constipation, diarrhea, nausea and vomiting.  Genitourinary: Negative for difficulty urinating, dysuria, frequency and urgency.  Musculoskeletal: Positive for arthralgias (typical arthritis). Negative for back pain, gait problem and myalgias.  Skin: Negative for color change, pallor, rash and wound.  Neurological: Positive for weakness. Negative for dizziness, tremors, syncope, speech difficulty,  numbness and headaches.  Psychiatric/Behavioral: Negative for agitation and behavioral problems.  All other systems reviewed and are negative.   Immunization History  Administered Date(s) Administered  . Influenza-Unspecified 08/13/2015, 08/12/2016   Pertinent  Health Maintenance Due  Topic Date Due  . FOOT EXAM  08/28/1940  . OPHTHALMOLOGY EXAM  08/28/1940  . DEXA SCAN  08/28/1997  . PNA vac Low Risk Adult (1 of 2 - PCV13) 08/28/1997  . INFLUENZA VACCINE  06/09/2017  . HEMOGLOBIN A1C  10/13/2017   No flowsheet data found. Functional Status Survey:    Vitals:   07/20/17 1156  BP: (!) 187/77  Pulse: 74  Resp: 18  Temp: 98.2 F (36.8 C)  SpO2: 100%  Weight: 152 lb 12.8 oz (69.3 kg)  Height: 5\' 4"  (1.626 m)   Body mass index is 26.23 kg/m. Physical Exam  Constitutional: She is oriented to person, place, and time. Vital signs are normal. She appears well-developed and well-nourished. She is active and cooperative. She does not appear ill. No distress.  HENT:  Head: Normocephalic and atraumatic.  Mouth/Throat: Uvula is midline and oropharynx is clear and moist. Mucous membranes are not pale, dry  and not cyanotic.  Eyes: Pupils are equal, round, and reactive to light. Conjunctivae, EOM and lids are normal.  Neck: Trachea normal, normal range of motion and full passive range of motion without pain. Neck supple. No JVD present. No tracheal deviation, no edema and no erythema present. No thyromegaly present.  Cardiovascular: Normal rate, regular rhythm, normal heart sounds, intact distal pulses and normal pulses.  Exam reveals no gallop, no distant heart sounds and no friction rub.   No murmur heard. Pulses:      Dorsalis pedis pulses are 2+ on the right side, and 2+ on the left side.  Pulmonary/Chest: Effort normal. No accessory muscle usage. No respiratory distress. She has no decreased breath sounds. She has no wheezes. She has no rhonchi. She has no rales. She exhibits no tenderness.  Abdominal: Soft. Normal appearance and bowel sounds are normal. She exhibits no distension and no ascites. There is no tenderness.  Musculoskeletal: Normal range of motion. She exhibits no edema or tenderness.  Expected osteoarthritis, stiffness  Neurological: She is alert and oriented to person, place, and time. She has normal strength. She is not disoriented.  Skin: Skin is warm, dry and intact. No rash noted. She is not diaphoretic. No cyanosis or erythema. No pallor. Nails show no clubbing.  Psychiatric: She has a normal mood and affect. Her speech is normal and behavior is normal. Judgment and thought content normal. Cognition and memory are normal.  Nursing note and vitals reviewed.   Labs reviewed:  Recent Labs  12/24/16 1730 01/01/17 1600 03/09/17 0816  NA 126* 128* 138  K 4.0 4.2 3.6  CL 89* 90* 100*  CO2 30 29 30   GLUCOSE 146* 304* 180*  BUN 32* 37* 44*  CREATININE 0.88 0.95 0.95  CALCIUM 8.4* 8.3* 9.5    Recent Labs  12/24/16 1730 01/01/17 1600 03/09/17 0816  AST 17 19 17   ALT 17 20 17   ALKPHOS 61 73 44  BILITOT 0.5 0.3 0.7  PROT 5.8* 6.0* 7.2  ALBUMIN 2.5* 2.3* 4.0     Recent Labs  12/24/16 1730 01/01/17 1600 03/09/17 0816  WBC 6.3 8.2 5.2  NEUTROABS 4.9 6.6* 3.3  HGB 11.1* 10.0* 11.5*  HCT 32.7* 30.2* 34.5*  MCV 92.2 94.1 98.9  PLT 168 312 170   Lab Results  Component Value Date   TSH 2.686 04/13/2017   Lab Results  Component Value Date   HGBA1C 6.5 (H) 04/13/2017   Lab Results  Component Value Date   CHOL 165 04/13/2017   HDL 70 04/13/2017   LDLCALC 76 04/13/2017   TRIG 95 04/13/2017   CHOLHDL 2.4 04/13/2017    Significant Diagnostic Results in last 30 days:  No results found.  Assessment/Plan 1. Type 2 diabetes mellitus with stage 3 chronic kidney disease, with long-term current use of insulin (HCC)  Stable  Continue Humalog 75-25- 12 units SQ TID  Continue Metformin 500 mg po BID  FSBS AC/HS  2. Hx of falling  Stable   3. Dysphagia, oropharyngeal phase  Stable  Continue CCD diet  Add moisture/ gravy as a side on all meals  4. Peripheral vascular disease, unspecified ([place])  Stable   5. Atherosclerosis of native coronary artery of native heart with angina pectoris (HCC)  Stable  Continue ASA 81 mg po Q Day  Continue Nitroglycerin 0.4 mg- 1 tablet SL Q 5 minutes x 3 for angina  6. Essential (primary) hypertension  Stable  Continue Amlodipine 10 mg po Q Day  Continue Clonidine 0.1 mg po BID  Continue Isosorbide ER 30 mg po Q Day  Continue Metoprolol ER 100 mg po BID  Continue Olmasartan-HCTZ 40-25 mg- 1 tablet po Q Day  Continue Potassium Chloride 8 meQ 1 tablet po BID  7. Glaucoma of both eyes, unspecified glaucoma type  Stable  Continue Latanoprost 0.005% 1 drop in both eyes at bedtime  8. Hypothyroidism, unspecified type  Stable  Continue levothyroxine 50 mcg po Q Day  9. Allergic rhinitis, unspecified seasonality, unspecified trigger  Stable  Continue Fluticasone 50 mcg- 1 spray each nostril Q Day  10. Nutritional deficiency  Stable  11. Hyperlipidemia  Stable  No  further medication management necessary  Family/ staff Communication:   Total Time:  Documentation:  Face to Face:  Family/Phone:   Labs/tests ordered:    Medication list reviewed and assessed for continued appropriateness. Monthly medication orders reviewed and signed.  Vikki Ports, NP-C Geriatrics [facility] [phone], [place] [address] Cell Phone (Mon-Fri 8am-5pm):  [phone] On Call:  [phone] & follow prompts after 5pm & weekends Office Phone:  [phone] Office Fax:  [phone]

## 2017-08-07 ENCOUNTER — Other Ambulatory Visit
Admission: RE | Admit: 2017-08-07 | Discharge: 2017-08-07 | Disposition: A | Discharge status: Home / Self Care | Payer: Medicare Other | Source: Ambulatory Visit | Attending: Gerontology | Admitting: Gerontology

## 2017-08-07 DIAGNOSIS — R3915 Urgency of urination: Secondary | ICD-10-CM | POA: Diagnosis present

## 2017-08-07 DIAGNOSIS — R350 Frequency of micturition: Secondary | ICD-10-CM | POA: Insufficient documentation

## 2017-08-07 DIAGNOSIS — R300 Dysuria: Secondary | ICD-10-CM | POA: Insufficient documentation

## 2017-08-07 LAB — URINALYSIS, COMPLETE (UACMP) WITH MICROSCOPIC
BACTERIA UA: NONE SEEN
Bilirubin Urine: NEGATIVE
KETONES UR: NEGATIVE mg/dL
Nitrite: NEGATIVE
PH: 5 (ref 5.0–8.0)
Protein, ur: 30 mg/dL — AB
Specific Gravity, Urine: 1.016 (ref 1.005–1.030)

## 2017-08-09 ENCOUNTER — Encounter
Admission: RE | Admit: 2017-08-09 | Discharge: 2017-08-09 | Disposition: A | Discharge status: Home / Self Care | Payer: Medicare Other | Source: Ambulatory Visit | Attending: Internal Medicine | Admitting: Internal Medicine

## 2017-08-09 LAB — URINE CULTURE

## 2017-08-12 ENCOUNTER — Other Ambulatory Visit
Admission: RE | Admit: 2017-08-12 | Discharge: 2017-08-12 | Disposition: A | Discharge status: Home / Self Care | Payer: Medicare Other | Source: Ambulatory Visit | Attending: Gerontology | Admitting: Gerontology

## 2017-08-12 DIAGNOSIS — R350 Frequency of micturition: Secondary | ICD-10-CM | POA: Insufficient documentation

## 2017-08-12 DIAGNOSIS — R300 Dysuria: Secondary | ICD-10-CM | POA: Insufficient documentation

## 2017-08-12 DIAGNOSIS — R3915 Urgency of urination: Secondary | ICD-10-CM | POA: Insufficient documentation

## 2017-08-12 LAB — URINALYSIS, COMPLETE (UACMP) WITH MICROSCOPIC
BILIRUBIN URINE: NEGATIVE
Bacteria, UA: NONE SEEN
GLUCOSE, UA: 150 mg/dL — AB
HGB URINE DIPSTICK: NEGATIVE
KETONES UR: NEGATIVE mg/dL
LEUKOCYTES UA: NEGATIVE
Nitrite: NEGATIVE
PROTEIN: NEGATIVE mg/dL
Specific Gravity, Urine: 1.009 (ref 1.005–1.030)
pH: 5 (ref 5.0–8.0)

## 2017-08-13 LAB — URINE CULTURE: Culture: NO GROWTH

## 2017-08-31 ENCOUNTER — Non-Acute Institutional Stay (SKILLED_NURSING_FACILITY): Payer: Medicare Other | Admitting: Gerontology

## 2017-08-31 ENCOUNTER — Encounter: Payer: Self-pay | Admitting: Gerontology

## 2017-08-31 DIAGNOSIS — Z794 Long term (current) use of insulin: Secondary | ICD-10-CM

## 2017-08-31 DIAGNOSIS — Z9181 History of falling: Secondary | ICD-10-CM

## 2017-08-31 DIAGNOSIS — I25119 Atherosclerotic heart disease of native coronary artery with unspecified angina pectoris: Secondary | ICD-10-CM | POA: Diagnosis not present

## 2017-08-31 DIAGNOSIS — I739 Peripheral vascular disease, unspecified: Secondary | ICD-10-CM

## 2017-08-31 DIAGNOSIS — J309 Allergic rhinitis, unspecified: Secondary | ICD-10-CM | POA: Diagnosis not present

## 2017-08-31 DIAGNOSIS — N183 Chronic kidney disease, stage 3 unspecified: Secondary | ICD-10-CM

## 2017-08-31 DIAGNOSIS — E785 Hyperlipidemia, unspecified: Secondary | ICD-10-CM

## 2017-08-31 DIAGNOSIS — H409 Unspecified glaucoma: Secondary | ICD-10-CM

## 2017-08-31 DIAGNOSIS — E039 Hypothyroidism, unspecified: Secondary | ICD-10-CM

## 2017-08-31 DIAGNOSIS — I10 Essential (primary) hypertension: Secondary | ICD-10-CM | POA: Diagnosis not present

## 2017-08-31 DIAGNOSIS — E639 Nutritional deficiency, unspecified: Secondary | ICD-10-CM

## 2017-08-31 DIAGNOSIS — R1312 Dysphagia, oropharyngeal phase: Secondary | ICD-10-CM | POA: Diagnosis not present

## 2017-08-31 DIAGNOSIS — E1122 Type 2 diabetes mellitus with diabetic chronic kidney disease: Secondary | ICD-10-CM | POA: Diagnosis not present

## 2017-09-02 ENCOUNTER — Other Ambulatory Visit
Admission: RE | Admit: 2017-09-02 | Discharge: 2017-09-02 | Disposition: A | Discharge status: Home / Self Care | Payer: Medicare Other | Source: Ambulatory Visit | Attending: Internal Medicine | Admitting: Internal Medicine

## 2017-09-02 DIAGNOSIS — R410 Disorientation, unspecified: Secondary | ICD-10-CM | POA: Diagnosis present

## 2017-09-02 LAB — URINALYSIS, COMPLETE (UACMP) WITH MICROSCOPIC
BILIRUBIN URINE: NEGATIVE
Bacteria, UA: NONE SEEN
GLUCOSE, UA: 50 mg/dL — AB
Hgb urine dipstick: NEGATIVE
KETONES UR: NEGATIVE mg/dL
LEUKOCYTES UA: NEGATIVE
Nitrite: NEGATIVE
PROTEIN: NEGATIVE mg/dL
RBC / HPF: NONE SEEN RBC/hpf (ref 0–5)
SQUAMOUS EPITHELIAL / LPF: NONE SEEN
Specific Gravity, Urine: 1.011 (ref 1.005–1.030)
WBC, UA: NONE SEEN WBC/hpf (ref 0–5)
pH: 5 (ref 5.0–8.0)

## 2017-09-03 LAB — URINE CULTURE

## 2017-09-08 NOTE — Progress Notes (Signed)
Location:   [facility] Room Number: [id number] Place of Service:  SNF [phone]) Provider:  Toni Arthurs, NP-C  Kirk Ruths, MD  Patient Care Team: Kirk Ruths, MD as PCP - General (Internal Medicine) Ubaldo Glassing Javier Docker, MD as Consulting Physician (Cardiology)  Extended Emergency Contact Information Primary Emergency Contact: Drake,Karen S Address: [place], [place] [address] [place] of [place] Phone: [phone] Work Phone: [phone] Relation: Daughter Secondary Emergency Contact: [place] Phone: [phone] Relation: Grandson  Code Status:  DNR Goals of care: Advanced Directive information Advanced Directives 08/31/2017  Does Patient Have a Medical Advance Directive? Yes  Type of Advance Directive -  Does patient want to make changes to medical advance directive? No - Patient declined  Copy of [place] in Chart? -  Pre-existing out of facility DNR order (yellow form or pink MOST form) -     Chief Complaint  Patient presents with  . Medical Management of Chronic Issues    Routine Visit    HPI:  Pt is a 83 y.o. female seen today for medical management of chronic diseases.  Patient has been stable over the past month.  Patient has not had any falls.  Her appetite is stable.  She is voiding well and having regular BMs.  Is continent of bowel and bladder.  She ambulates in the room with a walker.  She denies pain.  She denies n/v/d/f/c/cp/sob/ha/abd pain/dizziness/cough/congestion.  TSH stable.  Vital signs stable.  No other complaints.   Past Medical History:  Diagnosis Date  . CAD (coronary artery disease)    status post 2 stents (Dr. Claiborne Billings, Dr. Elisabeth Cara, Dr. Fath-cardiologist) 2008; MI, 10/2008  . Colonic adenoma    unspecified  . Diabetes mellitus type 2, uncomplicated ([place])   . DM (diabetes mellitus) ([place])   . Glaucoma   . HTN (hypertension)   .  Microalbuminuria    diabetic  . Osteoarthritis   . Thyroid disease    Past Surgical History:  Procedure Laterality Date  . ABDOMINAL HYSTERECTOMY    . APPENDECTOMY  1949  . CATARACT EXTRACTION     left eye  . CERVICAL LAMINECTOMY  1994  . CESAREAN SECTION     x 3  . COLONOSCOPY W/ POLYPECTOMY  11/2007   (RTE) PCI/DES - Stents x 2 sequential- LAD 12/8/200  . CORONARY ANGIOPLASTY WITH STENT PLACEMENT     x 2  . ROTATOR CUFF REPAIR Left 1990    Allergies  Allergen Reactions  . Ace Inhibitors     Other reaction(s): Unknown  . Amlodipine     Other reaction(s): Unknown  . Norvasc [Amlodipine Besylate]     Generic Norvasc-->cough  . Simvastatin     Other reaction(s): Unknown  . Celebrex [Celecoxib] Rash    Hives, itching      Allergies as of 08/31/2017      Reactions   Ace Inhibitors    Other reaction(s): Unknown   Amlodipine    Other reaction(s): Unknown   Norvasc [amlodipine Besylate]    Generic Norvasc-->cough   Simvastatin    Other reaction(s): Unknown   Celebrex [celecoxib] Rash   Hives, itching       Medication List       Accurate as of 08/31/17 11:59 PM. Always use your most recent med list.          acetaminophen  325 MG tablet Commonly known as:  TYLENOL Take 650 mg by mouth every 6 (six) hours as needed for moderate pain.   amLODipine 10 MG tablet Commonly known as:  NORVASC Take 10 mg by mouth daily.   aspirin 81 MG chewable tablet Chew 81 mg by mouth daily.   cloNIDine 0.1 MG tablet Commonly known as:  CATAPRES Take 0.15 mg by mouth 2 (two) times daily. 1 and 1/2 tablets   DERMACLOUD Crea Apply liberal amount topically to area of skin irritation as needed. Ok to leave at bedside   dextrose 40 % Gel Commonly known as:  GLUTOSE Take 1 Tube by mouth every 30 (thirty) minutes as needed for low blood sugar. Until BS > 70 and pt is still responsive.   fluticasone 50 MCG/ACT nasal spray Commonly known as:  FLONASE Place 1 spray into both  nostrils daily.   GLUCAGON EMERGENCY 1 MG injection Generic drug:  glucagon Inject 1 mg into the muscle once as needed. For hypoglycemia (symptomatic) not responding to oral glucose or snack. May repeat x 1 after 15 mins. Use only if pt is becoming unresponsive.   insulin lispro protamine-lispro (75-25) 100 UNIT/ML Susp injection Commonly known as:  HUMALOG 75/25 MIX Inject 12 Units into the skin 3 (three) times daily with meals.   isosorbide mononitrate 30 MG 24 hr tablet Commonly known as:  IMDUR TAKE 1 TABLET DAILY   latanoprost 0.005 % ophthalmic solution Commonly known as:  XALATAN Place 1 drop into both eyes at bedtime.   levothyroxine 50 MCG tablet Commonly known as:  SYNTHROID, LEVOTHROID Take 50 mcg by mouth daily before breakfast.   metFORMIN 500 MG tablet Commonly known as:  GLUCOPHAGE Take 500 mg by mouth 2 (two) times daily with a meal.   metoprolol succinate 100 MG 24 hr tablet Commonly known as:  TOPROL-XL Take 100 mg by mouth 2 (two) times daily. Take with or immediately following a meal.   MUSCLE RUB 10-15 % Crea Apply 1 application topically 2 (two) times daily as needed.   nitroGLYCERIN 0.4 MG SL tablet Commonly known as:  NITROSTAT Place 0.4 mg under the tongue every 5 (five) minutes as needed.   olmesartan-hydrochlorothiazide 40-25 MG tablet Commonly known as:  BENICAR HCT Take 1 tablet by mouth daily.   potassium chloride 8 MEQ tablet Commonly known as:  KLOR-CON Take 8 mEq by mouth 2 (two) times daily. Give with food. Do not crush   senna-docusate 8.6-50 MG tablet Commonly known as:  Senokot-S Take 1 tablet by mouth 2 (two) times daily as needed for constipation.   traMADol 50 MG tablet Commonly known as:  ULTRAM Take 2 tablets (100 mg total) by mouth every 6 (six) hours as needed for moderate pain.       Review of Systems  Constitutional: Negative for activity change, appetite change, chills, diaphoresis, fatigue and fever  (questionable).  HENT: Negative for congestion, ear discharge, ear pain, facial swelling, nosebleeds, rhinorrhea, sinus pain, sinus pressure, sneezing, sore throat, trouble swallowing and voice change.   Respiratory: Negative for apnea, cough, choking, chest tightness, shortness of breath and wheezing.   Cardiovascular: Negative for chest pain, palpitations and leg swelling.  Gastrointestinal: Negative for abdominal distention, abdominal pain, constipation, diarrhea, nausea and vomiting.  Genitourinary: Negative for difficulty urinating, dysuria, frequency and urgency.  Musculoskeletal: Positive for arthralgias (typical arthritis). Negative for back pain, gait problem and myalgias.  Skin: Negative for color change, pallor, rash and wound.  Neurological: Positive for  weakness. Negative for dizziness, tremors, syncope, speech difficulty, numbness and headaches.  Psychiatric/Behavioral: Negative for agitation and behavioral problems.  All other systems reviewed and are negative.   Immunization History  Administered Date(s) Administered  . Influenza-Unspecified 08/13/2015, 08/12/2016, 08/03/2017   Pertinent  Health Maintenance Due  Topic Date Due  . FOOT EXAM  08/28/1940  . OPHTHALMOLOGY EXAM  08/28/1940  . DEXA SCAN  08/28/1997  . PNA vac Low Risk Adult (1 of 2 - PCV13) 08/09/2018 (Originally 08/28/1997)  . HEMOGLOBIN A1C  10/13/2017  . INFLUENZA VACCINE  Completed   No flowsheet data found. Functional Status Survey:    Vitals:   08/31/17 1428  BP: (!) 198/82  Pulse: 72  Resp: 19  Temp: 98 F (36.7 C)  SpO2: 96%  Weight: 156 lb 4.8 oz (70.9 kg)  Height: 5\' 4"  (1.626 m)   Body mass index is 26.83 kg/m. Physical Exam  Constitutional: She is oriented to person, place, and time. Vital signs are normal. She appears well-developed and well-nourished. She is active and cooperative. She does not appear ill. No distress.  HENT:  Head: Normocephalic and atraumatic.  Mouth/Throat:  Uvula is midline and oropharynx is clear and moist. Mucous membranes are not pale, dry and not cyanotic.  Eyes: Pupils are equal, round, and reactive to light. Conjunctivae, EOM and lids are normal.  Neck: Trachea normal, normal range of motion and full passive range of motion without pain. Neck supple. No JVD present. No tracheal deviation, no edema and no erythema present. No thyromegaly present.  Cardiovascular: Normal rate, regular rhythm, normal heart sounds, intact distal pulses and normal pulses.  Exam reveals no gallop, no distant heart sounds and no friction rub.   No murmur heard. Pulses:      Dorsalis pedis pulses are 2+ on the right side, and 2+ on the left side.  No edema  Pulmonary/Chest: Effort normal and breath sounds normal. No accessory muscle usage. No respiratory distress. She has no decreased breath sounds. She has no wheezes. She has no rhonchi. She has no rales. She exhibits no tenderness.  Abdominal: Soft. Normal appearance and bowel sounds are normal. She exhibits no distension and no ascites. There is no tenderness.  Musculoskeletal: Normal range of motion. She exhibits no edema or tenderness.  Expected osteoarthritis, stiffness  Neurological: She is alert and oriented to person, place, and time. She has normal strength. She is not disoriented.  Skin: Skin is warm, dry and intact. No rash noted. She is not diaphoretic. No cyanosis or erythema. No pallor. Nails show no clubbing.  Psychiatric: She has a normal mood and affect. Her speech is normal and behavior is normal. Judgment and thought content normal. Cognition and memory are normal.  Nursing note and vitals reviewed.   Labs reviewed:  Recent Labs  12/24/16 1730 01/01/17 1600 03/09/17 0816  NA 126* 128* 138  K 4.0 4.2 3.6  CL 89* 90* 100*  CO2 30 29 30   GLUCOSE 146* 304* 180*  BUN 32* 37* 44*  CREATININE 0.88 0.95 0.95  CALCIUM 8.4* 8.3* 9.5    Recent Labs  12/24/16 1730 01/01/17 1600 03/09/17 0816   AST 17 19 17   ALT 17 20 17   ALKPHOS 61 73 44  BILITOT 0.5 0.3 0.7  PROT 5.8* 6.0* 7.2  ALBUMIN 2.5* 2.3* 4.0    Recent Labs  12/24/16 1730 01/01/17 1600 03/09/17 0816  WBC 6.3 8.2 5.2  NEUTROABS 4.9 6.6* 3.3  HGB 11.1* 10.0* 11.5*  HCT  32.7* 30.2* 34.5*  MCV 92.2 94.1 98.9  PLT 168 312 170   Lab Results  Component Value Date   TSH 2.686 04/13/2017   Lab Results  Component Value Date   HGBA1C 6.5 (H) 04/13/2017   Lab Results  Component Value Date   CHOL 165 04/13/2017   HDL 70 04/13/2017   LDLCALC 76 04/13/2017   TRIG 95 04/13/2017   CHOLHDL 2.4 04/13/2017    Significant Diagnostic Results in last 30 days:  No results found.  Assessment/Plan 1. Type 2 diabetes mellitus with stage 3 chronic kidney disease, with long-term current use of insulin (HCC)  Stable  Continue Humalog 75-25- 12 units SQ TID  Continue Metformin 500 mg po BID  FSBS AC/HS  2. Hx of falling  Stable   3. Dysphagia, oropharyngeal phase  Stable  Continue CCD diet  Add moisture/ gravy as a side on all meals  4. Peripheral vascular disease, unspecified ([place])  Stable   5. Atherosclerosis of native coronary artery of native heart with angina pectoris (HCC)  Stable  Continue ASA 81 mg po Q Day  Continue Nitroglycerin 0.4 mg- 1 tablet SL Q 5 minutes x 3 for angina  6. Essential (primary) hypertension  Stable  Continue Amlodipine 10 mg po Q Day  Continue Clonidine 0.1 mg po BID  Continue Isosorbide ER 30 mg po Q Day  Continue Metoprolol ER 100 mg po BID  Continue Olmasartan-HCTZ 40-25 mg- 1 tablet po Q Day  Continue Potassium Chloride 8 meQ 1 tablet po BID  7. Glaucoma of both eyes, unspecified glaucoma type  Stable  Continue Latanoprost 0.005% 1 drop in both eyes at bedtime  8. Hypothyroidism, unspecified type  Stable  Continue levothyroxine 50 mcg po Q Day  9. Allergic rhinitis, unspecified seasonality, unspecified trigger  Stable  Continue  Fluticasone 50 mcg- 1 spray each nostril Q Day  10. Nutritional deficiency  Stable  11. Hyperlipidemia  Stable  No further medication management necessary  Continue all medications as listed above unless otherwise indicated  Family/ staff Communication:   Total Time:  Documentation:  Face to Face:  Family/Phone:   Labs/tests ordered: Next month  Medication list reviewed and assessed for continued appropriateness. Monthly medication orders reviewed and signed.  Vikki Ports, NP-C Geriatrics [facility] [phone], [place] [address] Cell Phone (Mon-Fri 8am-5pm):  [phone] On Call:  [phone] & follow prompts after 5pm & weekends Office Phone:  [phone] Office Fax:  [phone]

## 2017-09-09 ENCOUNTER — Encounter
Admission: RE | Admit: 2017-09-09 | Discharge: 2017-09-09 | Disposition: A | Discharge status: Home / Self Care | Payer: Medicare Other | Source: Ambulatory Visit | Attending: Internal Medicine | Admitting: Internal Medicine

## 2017-09-27 ENCOUNTER — Encounter: Payer: Self-pay | Admitting: Gerontology

## 2017-09-27 ENCOUNTER — Non-Acute Institutional Stay (SKILLED_NURSING_FACILITY): Payer: Medicare Other | Admitting: Gerontology

## 2017-09-27 DIAGNOSIS — R1312 Dysphagia, oropharyngeal phase: Secondary | ICD-10-CM | POA: Diagnosis not present

## 2017-09-27 DIAGNOSIS — Z794 Long term (current) use of insulin: Secondary | ICD-10-CM

## 2017-09-27 DIAGNOSIS — E785 Hyperlipidemia, unspecified: Secondary | ICD-10-CM

## 2017-09-27 DIAGNOSIS — E639 Nutritional deficiency, unspecified: Secondary | ICD-10-CM

## 2017-09-27 DIAGNOSIS — I25119 Atherosclerotic heart disease of native coronary artery with unspecified angina pectoris: Secondary | ICD-10-CM

## 2017-09-27 DIAGNOSIS — E1122 Type 2 diabetes mellitus with diabetic chronic kidney disease: Secondary | ICD-10-CM

## 2017-09-27 DIAGNOSIS — Z9181 History of falling: Secondary | ICD-10-CM | POA: Diagnosis not present

## 2017-09-27 DIAGNOSIS — N183 Chronic kidney disease, stage 3 unspecified: Secondary | ICD-10-CM

## 2017-09-27 DIAGNOSIS — I10 Essential (primary) hypertension: Secondary | ICD-10-CM

## 2017-09-27 DIAGNOSIS — E039 Hypothyroidism, unspecified: Secondary | ICD-10-CM

## 2017-09-27 DIAGNOSIS — J309 Allergic rhinitis, unspecified: Secondary | ICD-10-CM

## 2017-09-27 DIAGNOSIS — H409 Unspecified glaucoma: Secondary | ICD-10-CM

## 2017-09-27 DIAGNOSIS — I739 Peripheral vascular disease, unspecified: Secondary | ICD-10-CM

## 2017-10-09 ENCOUNTER — Encounter
Admission: RE | Admit: 2017-10-09 | Discharge: 2017-10-09 | Disposition: A | Discharge status: Home / Self Care | Payer: Medicare Other | Source: Ambulatory Visit | Attending: Internal Medicine | Admitting: Internal Medicine

## 2017-10-26 ENCOUNTER — Non-Acute Institutional Stay (SKILLED_NURSING_FACILITY): Payer: Medicare Other | Admitting: Gerontology

## 2017-10-26 ENCOUNTER — Encounter: Payer: Self-pay | Admitting: Gerontology

## 2017-10-26 DIAGNOSIS — I739 Peripheral vascular disease, unspecified: Secondary | ICD-10-CM | POA: Diagnosis not present

## 2017-10-26 DIAGNOSIS — M1990 Unspecified osteoarthritis, unspecified site: Secondary | ICD-10-CM

## 2017-10-26 DIAGNOSIS — E039 Hypothyroidism, unspecified: Secondary | ICD-10-CM | POA: Diagnosis not present

## 2017-10-26 DIAGNOSIS — R1312 Dysphagia, oropharyngeal phase: Secondary | ICD-10-CM

## 2017-10-26 DIAGNOSIS — Z9181 History of falling: Secondary | ICD-10-CM

## 2017-10-26 DIAGNOSIS — E639 Nutritional deficiency, unspecified: Secondary | ICD-10-CM | POA: Diagnosis not present

## 2017-10-26 DIAGNOSIS — I25119 Atherosclerotic heart disease of native coronary artery with unspecified angina pectoris: Secondary | ICD-10-CM

## 2017-10-26 DIAGNOSIS — J309 Allergic rhinitis, unspecified: Secondary | ICD-10-CM | POA: Diagnosis not present

## 2017-10-26 DIAGNOSIS — I10 Essential (primary) hypertension: Secondary | ICD-10-CM

## 2017-10-26 DIAGNOSIS — H409 Unspecified glaucoma: Secondary | ICD-10-CM

## 2017-10-26 DIAGNOSIS — E785 Hyperlipidemia, unspecified: Secondary | ICD-10-CM

## 2017-10-26 DIAGNOSIS — E1122 Type 2 diabetes mellitus with diabetic chronic kidney disease: Secondary | ICD-10-CM

## 2017-10-26 DIAGNOSIS — K5900 Constipation, unspecified: Secondary | ICD-10-CM

## 2017-10-26 DIAGNOSIS — Z794 Long term (current) use of insulin: Secondary | ICD-10-CM

## 2017-10-26 DIAGNOSIS — J40 Bronchitis, not specified as acute or chronic: Secondary | ICD-10-CM

## 2017-10-26 DIAGNOSIS — N183 Chronic kidney disease, stage 3 unspecified: Secondary | ICD-10-CM

## 2017-11-09 ENCOUNTER — Encounter
Admission: RE | Admit: 2017-11-09 | Discharge: 2017-11-09 | Disposition: A | Discharge status: Home / Self Care | Payer: Medicare Other | Source: Ambulatory Visit | Attending: Internal Medicine | Admitting: Internal Medicine

## 2017-11-11 NOTE — Progress Notes (Signed)
Location:   [facility] Room Number: [phone] Place of Service:  SNF [phone]) Provider:  Toni Arthurs, NP-C  Kirk Ruths, MD  Patient Care Team: Kirk Ruths, MD as PCP - General (Internal Medicine) Ubaldo Glassing Javier Docker, MD as Consulting Physician (Cardiology)  Extended Emergency Contact Information Primary Emergency Contact: Drake,Karen S Address: [place], [place] [address] Johnnette [place]itter of [place] Phone: [phone] Work Phone: [phone] Relation: Daughter Secondary Emergency Contact: [place] of [place] Phone: [phone] Relation: Grandson  Code Status:  DNR Goals of care: Advanced Directive information Advanced Directives 10/26/2017  Does Patient Have a Medical Advance Directive? Yes  Type of Advance Directive Out of facility DNR (pink MOST or yellow form);[place];Living will  Does patient want to make changes to medical advance directive? No - Patient declined  Copy of [place] in Chart? Yes  Pre-existing out of facility DNR order (yellow form or pink MOST form) -     Chief Complaint  Patient presents with  . Medical Management of Chronic Issues    Routine Visit    HPI:  Pt is a 84 y.o. female seen today for medical management of chronic diseases.  Patient has been stable over the past month.  Patient has not had any falls.  Her appetite is stable.  She is voiding well and having regular BMs.  She is continent of bowel and bladder.  She ambulates in the room with a walker.  Mobile on the unit in the wheelchair.  She denies pain.  TSH stable.  Vital signs stable.  No other complaints.   Past Medical History:  Diagnosis Date  . CAD (coronary artery disease)    status post 2 stents (Dr. Claiborne Billings, Dr. Elisabeth Cara, Dr. Fath-cardiologist) 2008; MI, 10/2008  . Colonic adenoma    unspecified  . Diabetes mellitus type 2, uncomplicated ([place])   . DM  (diabetes mellitus) ([place])   . Glaucoma   . HTN (hypertension)   . Microalbuminuria    diabetic  . Osteoarthritis   . Thyroid disease    Past Surgical History:  Procedure Laterality Date  . ABDOMINAL HYSTERECTOMY    . APPENDECTOMY  1949  . CATARACT EXTRACTION     left eye  . CERVICAL LAMINECTOMY  1994  . CESAREAN SECTION     x 3  . COLONOSCOPY W/ POLYPECTOMY  11/2007   (RTE) PCI/DES - Stents x 2 sequential- LAD 12/8/200  . CORONARY ANGIOPLASTY WITH STENT PLACEMENT     x 2  . ROTATOR CUFF REPAIR Left 1990    Allergies  Allergen Reactions  . Ace Inhibitors     Other reaction(s): Unknown  . Amlodipine     Other reaction(s): Unknown  . Norvasc [Amlodipine Besylate]     Generic Norvasc-->cough  . Simvastatin     Other reaction(s): Unknown  . Celebrex [Celecoxib] Rash    Hives, itching      Allergies as of 09/27/2017      Reactions   Ace Inhibitors    Other reaction(s): Unknown   Amlodipine    Other reaction(s): Unknown   Norvasc [amlodipine Besylate]    Generic Norvasc-->cough   Simvastatin    Other reaction(s): Unknown   Celebrex [celecoxib] Rash   Hives, itching       Medication List        Accurate as of 09/27/17 11:59 PM.  Always use your most recent med list.          acetaminophen 325 MG tablet Commonly known as:  TYLENOL Take 650 mg by mouth every 6 (six) hours as needed for moderate pain.   amLODipine 10 MG tablet Commonly known as:  NORVASC Take 10 mg by mouth daily.   aspirin 81 MG chewable tablet Chew 81 mg by mouth daily.   cloNIDine 0.1 MG tablet Commonly known as:  CATAPRES Take 0.15 mg by mouth 2 (two) times daily. 1 and 1/2 tablets   DERMACLOUD Crea Apply liberal amount topically to area of skin irritation as needed. Ok to leave at bedside   dextrose 40 % Gel Commonly known as:  GLUTOSE Take 1 Tube by mouth every 30 (thirty) minutes as needed for low blood sugar. Until BS > 70 and pt is still responsive.   fluticasone 50  MCG/ACT nasal spray Commonly known as:  FLONASE Place 1 spray into both nostrils daily.   GLUCAGON EMERGENCY 1 MG injection Generic drug:  glucagon Inject 1 mg into the muscle once as needed. For hypoglycemia (symptomatic) not responding to oral glucose or snack. May repeat x 1 after 15 mins. Use only if pt is becoming unresponsive.   insulin lispro protamine-lispro (75-25) 100 UNIT/ML Susp injection Commonly known as:  HUMALOG 75/25 MIX Inject 12 Units into the skin 3 (three) times daily with meals.   isosorbide mononitrate 30 MG 24 hr tablet Commonly known as:  IMDUR TAKE 1 TABLET DAILY   latanoprost 0.005 % ophthalmic solution Commonly known as:  XALATAN Place 1 drop into both eyes at bedtime.   levothyroxine 50 MCG tablet Commonly known as:  SYNTHROID, LEVOTHROID Take 50 mcg by mouth daily before breakfast.   metFORMIN 500 MG tablet Commonly known as:  GLUCOPHAGE Take 500 mg by mouth 2 (two) times daily with a meal.   metoprolol succinate 100 MG 24 hr tablet Commonly known as:  TOPROL-XL Take 100 mg by mouth 2 (two) times daily. Take with or immediately following a meal.   MUSCLE RUB 10-15 % Crea Apply 1 application topically 2 (two) times daily as needed.   nitroGLYCERIN 0.4 MG SL tablet Commonly known as:  NITROSTAT Place 0.4 mg under the tongue every 5 (five) minutes as needed.   olmesartan-hydrochlorothiazide 40-25 MG tablet Commonly known as:  BENICAR HCT Take 1 tablet by mouth daily.   potassium chloride 8 MEQ tablet Commonly known as:  KLOR-CON Take 8 mEq by mouth 2 (two) times daily. Give with food. Do not crush   senna-docusate 8.6-50 MG tablet Commonly known as:  Senokot-S Take 1 tablet by mouth 2 (two) times daily as needed for constipation.   traMADol 50 MG tablet Commonly known as:  ULTRAM Take 2 tablets (100 mg total) by mouth every 6 (six) hours as needed for moderate pain.       Review of Systems  Constitutional: Negative for activity  change, appetite change, chills, diaphoresis, fatigue and fever (questionable).  HENT: Negative for congestion, ear discharge, ear pain, facial swelling, mouth sores, nosebleeds, postnasal drip, rhinorrhea, sinus pressure, sinus pain, sneezing, sore throat, trouble swallowing and voice change.   Respiratory: Negative for apnea, cough, choking, chest tightness, shortness of breath and wheezing.   Cardiovascular: Negative for chest pain, palpitations and leg swelling.  Gastrointestinal: Negative for abdominal distention, abdominal pain, constipation, diarrhea, nausea and vomiting.  Genitourinary: Negative for difficulty urinating, dysuria, frequency and urgency.  Musculoskeletal: Positive for arthralgias (typical arthritis). Negative  for back pain, gait problem and myalgias.  Skin: Negative for color change, pallor, rash and wound.  Neurological: Positive for weakness. Negative for dizziness, tremors, syncope, speech difficulty, numbness and headaches.  Psychiatric/Behavioral: Negative for agitation and behavioral problems.  All other systems reviewed and are negative.   Immunization History  Administered Date(s) Administered  . Influenza-Unspecified 08/13/2015, 08/12/2016, 08/03/2017   Pertinent  Health Maintenance Due  Topic Date Due  . FOOT EXAM  08/28/1940  . OPHTHALMOLOGY EXAM  08/28/1940  . DEXA SCAN  08/28/1997  . HEMOGLOBIN A1C  10/13/2017  . PNA vac Low Risk Adult (1 of 2 - PCV13) 08/09/2018 (Originally 08/28/1997)  . INFLUENZA VACCINE  Completed   No flowsheet data found. Functional Status Survey:    Vitals:   09/27/17 1241  BP: (!) 168/74  Pulse: 77  Resp: 19  Temp: 98.1 F (36.7 C)  TempSrc: Oral  SpO2: 99%  Weight: 159 lb 4.8 oz (72.3 kg)  Height: 5\' 4"  (1.626 m)   Body mass index is 27.34 kg/m. Physical Exam  Constitutional: She is oriented to person, place, and time. Vital signs are normal. She appears well-developed and well-nourished. She is active and  cooperative. She does not appear ill. No distress.  HENT:  Head: Normocephalic and atraumatic.  Mouth/Throat: Uvula is midline and oropharynx is clear and moist. Mucous membranes are not pale, dry and not cyanotic.  Eyes: Conjunctivae, EOM and lids are normal. Pupils are equal, round, and reactive to light.  Neck: Trachea normal, normal range of motion and full passive range of motion without pain. Neck supple. No JVD present. No tracheal deviation, no edema and no erythema present. No thyromegaly present.  Cardiovascular: Normal rate, regular rhythm, normal heart sounds, intact distal pulses and normal pulses. Exam reveals no gallop, no distant heart sounds and no friction rub.  No murmur heard. Pulses:      Dorsalis pedis pulses are 2+ on the right side, and 2+ on the left side.  No edema  Pulmonary/Chest: Effort normal and breath sounds normal. No accessory muscle usage. No respiratory distress. She has no decreased breath sounds. She has no wheezes. She has no rhonchi. She has no rales. She exhibits no tenderness.  Abdominal: Soft. Normal appearance and bowel sounds are normal. She exhibits no distension and no ascites. There is no tenderness.  Musculoskeletal: Normal range of motion. She exhibits no edema or tenderness.  Expected osteoarthritis, stiffness  Neurological: She is alert and oriented to person, place, and time. She has normal strength. She is not disoriented. Coordination and gait abnormal.  Skin: Skin is warm, dry and intact. No rash noted. She is not diaphoretic. No cyanosis or erythema. No pallor. Nails show no clubbing.  Psychiatric: She has a normal mood and affect. Her behavior is normal. Judgment and thought content normal. Her speech is delayed. Cognition and memory are impaired. She exhibits abnormal recent memory and abnormal remote memory.  Nursing note and vitals reviewed.   Labs reviewed: Recent Labs    12/24/16 1730 01/01/17 1600 03/09/17 0816  NA 126* 128*  138  K 4.0 4.2 3.6  CL 89* 90* 100*  CO2 30 29 30   GLUCOSE 146* 304* 180*  BUN 32* 37* 44*  CREATININE 0.88 0.95 0.95  CALCIUM 8.4* 8.3* 9.5   Recent Labs    12/24/16 1730 01/01/17 1600 03/09/17 0816  AST 17 19 17   ALT 17 20 17   ALKPHOS 61 73 44  BILITOT 0.5 0.3 0.7  PROT  5.8* 6.0* 7.2  ALBUMIN 2.5* 2.3* 4.0   Recent Labs    12/24/16 1730 01/01/17 1600 03/09/17 0816  WBC 6.3 8.2 5.2  NEUTROABS 4.9 6.6* 3.3  HGB 11.1* 10.0* 11.5*  HCT 32.7* 30.2* 34.5*  MCV 92.2 94.1 98.9  PLT 168 312 170   Lab Results  Component Value Date   TSH 2.686 04/13/2017   Lab Results  Component Value Date   HGBA1C 6.5 (H) 04/13/2017   Lab Results  Component Value Date   CHOL 165 04/13/2017   HDL 70 04/13/2017   LDLCALC 76 04/13/2017   TRIG 95 04/13/2017   CHOLHDL 2.4 04/13/2017    Significant Diagnostic Results in last 30 days:  No results found.  Assessment/Plan 1. Type 2 diabetes mellitus with stage 3 chronic kidney disease, with long-term current use of insulin (HCC)  Stable  Continue Humalog 75-25- 12 units SQ TID  Continue Metformin 500 mg po BID  FSBS AC/HS  2. Hx of falling  Stable   3. Dysphagia, oropharyngeal phase  Stable  Continue CCD diet  Add moisture/ gravy as a side on all meals  4. Peripheral vascular disease, unspecified ([place])  Stable   5. Atherosclerosis of native coronary artery of native heart with angina pectoris (HCC)  Stable  Continue ASA 81 mg po Q Day  Continue Nitroglycerin 0.4 mg- 1 tablet SL Q 5 minutes x 3 for angina  6. Essential (primary) hypertension  Stable  Continue Amlodipine 10 mg po Q Day  Continue Clonidine 0.1 mg po BID  Continue Isosorbide ER 30 mg po Q Day  Continue Metoprolol ER 100 mg po BID  Continue Olmasartan-HCTZ 40-25 mg- 1 tablet po Q Day  Continue Potassium Chloride 8 meQ 1 tablet po BID  7. Glaucoma of both eyes, unspecified glaucoma type  Stable  Continue Latanoprost 0.005% 1 drop in  both eyes at bedtime  8. Hypothyroidism, unspecified type  Stable  Continue levothyroxine 50 mcg po Q Day  9. Allergic rhinitis, unspecified seasonality, unspecified trigger  Stable  Continue Fluticasone 50 mcg- 1 spray each nostril Q Day  10. Nutritional deficiency  Stable  11. Hyperlipidemia  Stable  No further medication management necessary  Continue all medications as listed above (09/27/2017)  Family/ staff Communication:   Total Time:  Documentation:  Face to Face:  Family/Phone:   Labs/tests ordered: Next month  Medication list reviewed and assessed for continued appropriateness. Monthly medication orders reviewed and signed.  Vikki Ports, NP-C [facility] [phone], [place] [address] Cell Phone (Mon-Fri 8am-5pm):  [phone] On Call:  [phone] & follow prompts after 5pm & weekends Office Phone:  [phone] Office Fax:  [phone]

## 2017-11-11 NOTE — Progress Notes (Signed)
Location:   [facility] Room Number: [phone] Place of Service:  SNF [phone]) Provider:  Toni Arthurs, NP-C  Kirk Ruths, MD  Patient Care Team: Kirk Ruths, MD as PCP - General (Internal Medicine) Ubaldo Glassing Javier Docker, MD as Consulting Physician (Cardiology)  Extended Emergency Contact Information Primary Emergency Contact: Drake,Karen S Address: [place], [place] [address] [place]itter of [place] Phone: [phone] Work Phone: [phone] Relation: Daughter Secondary Emergency Contact: [place] of [place] Phone: [phone] Relation: Grandson  Code Status:  DNR Goals of care: Advanced Directive information Advanced Directives 10/26/2017  Does Patient Have a Medical Advance Directive? Yes  Type of Advance Directive Out of facility DNR (pink MOST or yellow form);[place];Living will  Does patient want to make changes to medical advance directive? No - Patient declined  Copy of [place] in Chart? Yes  Pre-existing out of facility DNR order (yellow form or pink MOST form) -     Chief Complaint  Patient presents with  . Medical Management of Chronic Issues    Routine Visit    HPI:  Pt is a 84 y.o. female seen today for medical management of chronic diseases.  Patient has been stable over the past month.  Patient has not had any falls.  Her appetite is stable.  She is voiding well and having regular BMs.  She is continent of bowel and bladder.  She ambulates in the room with a walker.  Mobile on the unit with the wheelchair.  She does however complain today of a nonproductive cough and feeling like her chest is tight.  Her right lower lobe is diminished.  Patient has been afebrile.  Denies chest pain.  Vital signs stable.  No other complaints.   Past Medical History:  Diagnosis Date  . CAD (coronary artery disease)    status post 2 stents (Dr. Claiborne Billings, Dr.  Elisabeth Cara, Dr. Fath-cardiologist) 2008; MI, 10/2008  . Colonic adenoma    unspecified  . Diabetes mellitus type 2, uncomplicated ([place])   . DM (diabetes mellitus) ([place])   . Glaucoma   . HTN (hypertension)   . Microalbuminuria    diabetic  . Osteoarthritis   . Thyroid disease    Past Surgical History:  Procedure Laterality Date  . ABDOMINAL HYSTERECTOMY    . APPENDECTOMY  1949  . CATARACT EXTRACTION     left eye  . CERVICAL LAMINECTOMY  1994  . CESAREAN SECTION     x 3  . COLONOSCOPY W/ POLYPECTOMY  11/2007   (RTE) PCI/DES - Stents x 2 sequential- LAD 12/8/200  . CORONARY ANGIOPLASTY WITH STENT PLACEMENT     x 2  . ROTATOR CUFF REPAIR Left 1990    Allergies  Allergen Reactions  . Ace Inhibitors     Other reaction(s): Unknown  . Amlodipine     Other reaction(s): Unknown  . Norvasc [Amlodipine Besylate]     Generic Norvasc-->cough  . Simvastatin     Other reaction(s): Unknown  . Celebrex [Celecoxib] Rash    Hives, itching      Allergies as of 10/26/2017      Reactions   Ace Inhibitors    Other reaction(s): Unknown   Amlodipine    Other reaction(s): Unknown   Norvasc [amlodipine Besylate]    Generic Norvasc-->cough   Simvastatin    Other reaction(s): Unknown   Celebrex [celecoxib]  Rash   Hives, itching       Medication List        Accurate as of 10/26/17 11:59 PM. Always use your most recent med list.          acetaminophen 650 MG CR tablet Commonly known as:  TYLENOL Take 650 mg by mouth every 12 (twelve) hours as needed for pain.   acetaminophen 325 MG tablet Commonly known as:  TYLENOL Take 650 mg by mouth every 6 (six) hours as needed for moderate pain.   amLODipine 10 MG tablet Commonly known as:  NORVASC Take 10 mg by mouth daily.   aspirin 81 MG chewable tablet Chew 81 mg by mouth daily.   cloNIDine 0.1 MG tablet Commonly known as:  CATAPRES Take 0.15 mg by mouth 2 (two) times daily. 1 and 1/2 tablets   DERMACLOUD Crea Apply liberal  amount topically to area of skin irritation as needed. Ok to leave at bedside   dextrose 40 % Gel Commonly known as:  GLUTOSE Take 1 Tube by mouth every 30 (thirty) minutes as needed for low blood sugar. Until BS > 70 and pt is still responsive.   fluticasone 50 MCG/ACT nasal spray Commonly known as:  FLONASE Place 1 spray into both nostrils daily.   GLUCAGON EMERGENCY 1 MG injection Generic drug:  glucagon Inject 1 mg into the muscle once as needed. For hypoglycemia (symptomatic) not responding to oral glucose or snack. May repeat x 1 after 15 mins. Use only if pt is becoming unresponsive.   insulin lispro protamine-lispro (75-25) 100 UNIT/ML Susp injection Commonly known as:  HUMALOG 75/25 MIX Inject 12 Units into the skin 3 (three) times daily with meals.   isosorbide mononitrate 30 MG 24 hr tablet Commonly known as:  IMDUR TAKE 1 TABLET DAILY   latanoprost 0.005 % ophthalmic solution Commonly known as:  XALATAN Place 1 drop into both eyes at bedtime.   levothyroxine 50 MCG tablet Commonly known as:  SYNTHROID, LEVOTHROID Take 50 mcg by mouth daily before breakfast.   metFORMIN 500 MG tablet Commonly known as:  GLUCOPHAGE Take 500 mg by mouth 2 (two) times daily with a meal.   metoprolol succinate 100 MG 24 hr tablet Commonly known as:  TOPROL-XL Take 100 mg by mouth 2 (two) times daily. Take with or immediately following a meal.   MUSCLE RUB 10-15 % Crea Apply 1 application topically 2 (two) times daily as needed.   nitroGLYCERIN 0.4 MG SL tablet Commonly known as:  NITROSTAT Place 0.4 mg under the tongue every 5 (five) minutes as needed.   olmesartan-hydrochlorothiazide 40-25 MG tablet Commonly known as:  BENICAR HCT Take 1 tablet by mouth daily.   potassium chloride 8 MEQ tablet Commonly known as:  KLOR-CON Take 8 mEq by mouth 2 (two) times daily. Give with food. Do not crush   senna-docusate 8.6-50 MG tablet Commonly known as:  Senokot-S Take 1 tablet by  mouth 2 (two) times daily as needed for constipation.   traMADol 50 MG tablet Commonly known as:  ULTRAM Take 2 tablets (100 mg total) by mouth every 6 (six) hours as needed for moderate pain.       Review of Systems  Constitutional: Negative for activity change, appetite change, chills, diaphoresis, fatigue and fever (questionable).  HENT: Negative for congestion, ear discharge, ear pain, facial swelling, mouth sores, nosebleeds, postnasal drip, rhinorrhea, sinus pressure, sinus pain, sneezing, sore throat, trouble swallowing and voice change.   Respiratory: Positive for cough  and chest tightness. Negative for apnea, choking, shortness of breath and wheezing.   Cardiovascular: Negative for chest pain, palpitations and leg swelling.  Gastrointestinal: Negative for abdominal distention, abdominal pain, constipation, diarrhea, nausea and vomiting.  Genitourinary: Negative for difficulty urinating, dysuria, frequency and urgency.  Musculoskeletal: Positive for arthralgias (typical arthritis). Negative for back pain, gait problem and myalgias.  Skin: Negative for color change, pallor, rash and wound.  Neurological: Positive for weakness. Negative for dizziness, tremors, syncope, speech difficulty, numbness and headaches.  Psychiatric/Behavioral: Negative for agitation and behavioral problems.  All other systems reviewed and are negative.   Immunization History  Administered Date(s) Administered  . Influenza-Unspecified 08/13/2015, 08/12/2016, 08/03/2017   Pertinent  Health Maintenance Due  Topic Date Due  . FOOT EXAM  08/28/1940  . OPHTHALMOLOGY EXAM  08/28/1940  . DEXA SCAN  08/28/1997  . HEMOGLOBIN A1C  10/13/2017  . PNA vac Low Risk Adult (1 of 2 - PCV13) 08/09/2018 (Originally 08/28/1997)  . INFLUENZA VACCINE  Completed   No flowsheet data found. Functional Status Survey:    Vitals:   10/26/17 1519  BP: 125/67  Pulse: 79  Resp: 20  Temp: 98.4 F (36.9 C)  TempSrc: Oral    SpO2: 98%  Weight: 160 lb 4.8 oz (72.7 kg)  Height: 5\' 4"  (1.626 m)   Body mass index is 27.52 kg/m. Physical Exam  Constitutional: She is oriented to person, place, and time. Vital signs are normal. She appears well-developed and well-nourished. She is active and cooperative. She does not appear ill. No distress.  HENT:  Head: Normocephalic and atraumatic.  Mouth/Throat: Uvula is midline and oropharynx is clear and moist. Mucous membranes are not pale, dry and not cyanotic.  Eyes: Conjunctivae, EOM and lids are normal. Pupils are equal, round, and reactive to light.  Neck: Trachea normal, normal range of motion and full passive range of motion without pain. Neck supple. No JVD present. No tracheal deviation, no edema and no erythema present. No thyromegaly present.  Cardiovascular: Normal rate, regular rhythm, normal heart sounds, intact distal pulses and normal pulses. Exam reveals no gallop, no distant heart sounds and no friction rub.  No murmur heard. Pulses:      Dorsalis pedis pulses are 2+ on the right side, and 2+ on the left side.  No edema  Pulmonary/Chest: Effort normal. No accessory muscle usage. No respiratory distress. She has decreased breath sounds in the right lower field. She has no wheezes. She has no rhonchi. She has no rales. She exhibits no tenderness.  Abdominal: Soft. Normal appearance and bowel sounds are normal. She exhibits no distension and no ascites. There is no tenderness.  Musculoskeletal: Normal range of motion. She exhibits no edema or tenderness.  Expected osteoarthritis, stiffness  Neurological: She is alert and oriented to person, place, and time. She has normal strength. She is not disoriented. Coordination and gait abnormal.  Skin: Skin is warm, dry and intact. No rash noted. She is not diaphoretic. No cyanosis or erythema. No pallor. Nails show no clubbing.  Psychiatric: She has a normal mood and affect. Her behavior is normal. Judgment and thought  content normal. Her speech is delayed. Cognition and memory are impaired. She exhibits abnormal recent memory and abnormal remote memory.  Nursing note and vitals reviewed.   Labs reviewed: Recent Labs    12/24/16 1730 01/01/17 1600 03/09/17 0816  NA 126* 128* 138  K 4.0 4.2 3.6  CL 89* 90* 100*  CO2 30 29 30  GLUCOSE 146* 304* 180*  BUN 32* 37* 44*  CREATININE 0.88 0.95 0.95  CALCIUM 8.4* 8.3* 9.5   Recent Labs    12/24/16 1730 01/01/17 1600 03/09/17 0816  AST 17 19 17   ALT 17 20 17   ALKPHOS 61 73 44  BILITOT 0.5 0.3 0.7  PROT 5.8* 6.0* 7.2  ALBUMIN 2.5* 2.3* 4.0   Recent Labs    12/24/16 1730 01/01/17 1600 03/09/17 0816  WBC 6.3 8.2 5.2  NEUTROABS 4.9 6.6* 3.3  HGB 11.1* 10.0* 11.5*  HCT 32.7* 30.2* 34.5*  MCV 92.2 94.1 98.9  PLT 168 312 170   Lab Results  Component Value Date   TSH 2.686 04/13/2017   Lab Results  Component Value Date   HGBA1C 6.5 (H) 04/13/2017   Lab Results  Component Value Date   CHOL 165 04/13/2017   HDL 70 04/13/2017   LDLCALC 76 04/13/2017   TRIG 95 04/13/2017   CHOLHDL 2.4 04/13/2017    Significant Diagnostic Results in last 30 days:  No results found.  Assessment/Plan 1. Type 2 diabetes mellitus with stage 3 chronic kidney disease, with long-term current use of insulin (HCC)  Stable  Continue Humalog 75-25- 12 units SQ TID  Continue Metformin 500 mg po BID  FSBS AC/HS  Crestor 5 mg 1 tablet p.o. weekly on Sunday for decreased cardiovascular risk related to diabetes  2. Hx of falling  Stable   3. Dysphagia, oropharyngeal phase  Stable  Continue CCD diet  Add moisture/ gravy as a side on all meals  4. Peripheral vascular disease, unspecified ([place])  Stable   5. Atherosclerosis of native coronary artery of native heart with angina pectoris (HCC)  Stable  Continue ASA 81 mg po Q Day  Continue Nitroglycerin 0.4 mg- 1 tablet SL Q 5 minutes x 3 for angina  6. Essential (primary)  hypertension  Stable  Continue Amlodipine 10 mg po Q Day  Continue Clonidine 0.1 mg po BID  Continue Isosorbide ER 30 mg po Q Day  Continue Metoprolol ER 100 mg po BID  Continue Olmasartan-HCTZ 40-25 mg- 1 tablet po Q Day  Continue Potassium Chloride 8 meQ 1 tablet po BID  7. Glaucoma of both eyes, unspecified glaucoma type  Stable  Continue Latanoprost 0.005% 1 drop in both eyes at bedtime  8. Hypothyroidism, unspecified type  Stable  Continue levothyroxine 50 mcg po Q Day  9. Allergic rhinitis, unspecified seasonality, unspecified trigger  Stable  Continue Fluticasone 50 mcg- 1 spray each nostril Q Day  10. Nutritional deficiency  Stable  11. Hyperlipidemia  Stable  No further medication management necessary  12.  Osteoarthritis, unspecified osteoarthritis type, unspecified site  Stable  Tylenol CR 650 mg tablet 1 tablet p.o. every 12 hours  Tramadol 50 mg tablet-2 tablets p.o. every 6 hours as needed  Muscle rub 10-15% thin film twice daily as needed to area of soreness/pain  13.  Constipation, unspecified constipation type  Stable  Continue senna S8.6-51 tablet p.o. twice daily as needed  14. Bronchitis  Duo nebs 3 times daily times 3 days  Continue all medications as listed above (10/26/2017)  Family/ staff Communication:   Total Time:  Documentation:  Face to Face:  Family/Phone:   Labs/tests ordered: Next month  Medication list reviewed and assessed for continued appropriateness. Monthly medication orders reviewed and signed.  Vikki Ports, NP-C Geriatric[facility] [id number] [place], [place] [address] Cell Phone (Mon-Fri 8am-5pm):  [phone]  On Call:  [phone] & follow prompts after 5pm & weekends Office Phone:  [phone] Office Fax:  [phone]

## 2017-11-16 ENCOUNTER — Other Ambulatory Visit
Admission: RE | Admit: 2017-11-16 | Discharge: 2017-11-16 | Disposition: A | Discharge status: Home / Self Care | Payer: Medicare Other | Source: Ambulatory Visit | Attending: Gerontology | Admitting: Gerontology

## 2017-11-16 DIAGNOSIS — E1122 Type 2 diabetes mellitus with diabetic chronic kidney disease: Secondary | ICD-10-CM | POA: Insufficient documentation

## 2017-11-16 LAB — CBC WITH DIFFERENTIAL/PLATELET
BASOS PCT: 1 %
Basophils Absolute: 0.1 10*3/uL (ref 0–0.1)
EOS ABS: 0.9 10*3/uL — AB (ref 0–0.7)
EOS PCT: 16 %
HCT: 40.4 % (ref 35.0–47.0)
HEMOGLOBIN: 13.5 g/dL (ref 12.0–16.0)
LYMPHS ABS: 1.6 10*3/uL (ref 1.0–3.6)
Lymphocytes Relative: 28 %
MCH: 31.9 pg (ref 26.0–34.0)
MCHC: 33.5 g/dL (ref 32.0–36.0)
MCV: 95.1 fL (ref 80.0–100.0)
Monocytes Absolute: 0.4 10*3/uL (ref 0.2–0.9)
Monocytes Relative: 7 %
NEUTROS PCT: 48 %
Neutro Abs: 2.8 10*3/uL (ref 1.4–6.5)
PLATELETS: 160 10*3/uL (ref 150–440)
RBC: 4.25 MIL/uL (ref 3.80–5.20)
RDW: 13.9 % (ref 11.5–14.5)
WBC: 5.8 10*3/uL (ref 3.6–11.0)

## 2017-11-16 LAB — COMPREHENSIVE METABOLIC PANEL
ALBUMIN: 4.2 g/dL (ref 3.5–5.0)
ALK PHOS: 55 U/L (ref 38–126)
ALT: 18 U/L (ref 14–54)
AST: 22 U/L (ref 15–41)
Anion gap: 9 (ref 5–15)
BILIRUBIN TOTAL: 0.6 mg/dL (ref 0.3–1.2)
BUN: 34 mg/dL — ABNORMAL HIGH (ref 6–20)
CALCIUM: 9.7 mg/dL (ref 8.9–10.3)
CO2: 30 mmol/L (ref 22–32)
CREATININE: 0.99 mg/dL (ref 0.44–1.00)
Chloride: 100 mmol/L — ABNORMAL LOW (ref 101–111)
GFR calc Af Amer: 59 mL/min — ABNORMAL LOW (ref >60)
GFR calc non Af Amer: 51 mL/min — ABNORMAL LOW (ref >60)
GLUCOSE: 119 mg/dL — AB (ref 65–99)
Potassium: 3.6 mmol/L (ref 3.5–5.1)
SODIUM: 139 mmol/L (ref 135–145)
TOTAL PROTEIN: 7.5 g/dL (ref 6.5–8.1)

## 2017-11-16 LAB — LIPID PANEL
CHOL/HDL RATIO: 5.2 ratio
CHOLESTEROL: 234 mg/dL — AB (ref 0–200)
HDL: 45 mg/dL (ref >40)
LDL Cholesterol: 144 mg/dL — ABNORMAL HIGH (ref 0–99)
TRIGLYCERIDES: 223 mg/dL — AB (ref <150)
VLDL: 45 mg/dL — ABNORMAL HIGH (ref 0–40)

## 2017-11-16 LAB — HEMOGLOBIN A1C
Hgb A1c MFr Bld: 8.3 % — ABNORMAL HIGH (ref 4.8–5.6)
Mean Plasma Glucose: 191.51 mg/dL

## 2017-11-16 LAB — VITAMIN B12: Vitamin B-12: 454 pg/mL (ref 180–914)

## 2017-11-16 LAB — MAGNESIUM: Magnesium: 2 mg/dL (ref 1.7–2.4)

## 2017-11-16 LAB — TSH: TSH: 1.949 u[IU]/mL (ref 0.350–4.500)

## 2017-11-17 LAB — VITAMIN D 25 HYDROXY (VIT D DEFICIENCY, FRACTURES): VIT D 25 HYDROXY: 19.9 ng/mL — AB (ref 30.0–100.0)

## 2017-12-03 ENCOUNTER — Encounter: Payer: Self-pay | Admitting: Gerontology

## 2017-12-03 ENCOUNTER — Non-Acute Institutional Stay (SKILLED_NURSING_FACILITY): Payer: Medicare Other | Admitting: Gerontology

## 2017-12-03 DIAGNOSIS — I739 Peripheral vascular disease, unspecified: Secondary | ICD-10-CM

## 2017-12-03 DIAGNOSIS — I25119 Atherosclerotic heart disease of native coronary artery with unspecified angina pectoris: Secondary | ICD-10-CM

## 2017-12-03 DIAGNOSIS — I10 Essential (primary) hypertension: Secondary | ICD-10-CM | POA: Diagnosis not present

## 2017-12-10 ENCOUNTER — Encounter
Admission: RE | Admit: 2017-12-10 | Discharge: 2017-12-10 | Disposition: A | Discharge status: Home / Self Care | Payer: Medicare Other | Source: Ambulatory Visit | Attending: Internal Medicine | Admitting: Internal Medicine

## 2017-12-13 NOTE — Assessment & Plan Note (Signed)
Stable. Symptoms controlled with Imdur. Continue ASA 81 mg for heart health. No c/o chest pain or shortness of breath.

## 2017-12-13 NOTE — Progress Notes (Signed)
Location:    [facility]ursing Home Room Number: 740C Place of Service:  SNF (31) Provider:  Toni Arthurs, NP-C  Kirk Ruths, MD  Patient Care Team: Kirk Ruths, MD as PCP - General (Internal Medicine) Ubaldo Glassing Javier Docker, MD as Consulting Physician (Cardiology)  Extended Emergency Contact Information Primary Emergency Contact: Drake,Karen S Address: [place], [place] [address] Johnnette Litter of [place] Phone: [phone] Work Phone: [phone] Relation: Daughter Secondary Emergency Contact: [place] Phone: [phone] Relation: Grandson  Code Status:  DNR Goals of care: Advanced Directive information Advanced Directives 12/03/2017  Does Patient Have a Medical Advance Directive? Yes  Type of Paramedic of [place];Out of facility DNR (pink MOST or yellow form)  Does patient want to make changes to medical advance directive? No - Patient declined  Copy of [place] in Chart? Yes  Pre-existing out of facility DNR order (yellow form or pink MOST form) Yellow form placed in chart (order not valid for inpatient use)     Chief Complaint  Patient presents with  . Medical Management of Chronic Issues    Routine Visit    HPI:  Pt is a 84 y.o. female seen today for medical management of chronic diseases.    Peripheral vascular disease, unspecified ([place]) Stable. B-pedal pulses equal. Cap refills in BLE WNL/ equal.   Essential (primary) hypertension Blood pressures elevated more as of late. Will increase Clonidine to 0.2 mg BID. Continue Norvasc, Benicar, metoprolol, potassium. 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 Symptoms controlled with Imdur. Continue ASA 81 mg for heart health. No c/o chest pain or shortness of breath.  Please note pt with limited verbal  ability. Unable to obtain complete ROS. Some ROS info obtained from staff and documentation.    Past Medical History:  Diagnosis Date  . CAD (coronary artery disease)    status post 2 stents (Dr. Claiborne Billings, Dr. Elisabeth Cara, Dr. Fath-cardiologist) 2008; MI, 10/2008  . Colonic adenoma    unspecified  . Diabetes mellitus type 2, uncomplicated ([place])   . DM (diabetes mellitus) ([place])   . Glaucoma   . HTN (hypertension)   . Microalbuminuria    diabetic  . Osteoarthritis   . Thyroid disease    Past Surgical History:  Procedure Laterality Date  . ABDOMINAL HYSTERECTOMY    . APPENDECTOMY  1949  . CATARACT EXTRACTION     left eye  . CERVICAL LAMINECTOMY  1994  . CESAREAN SECTION     x 3  . COLONOSCOPY W/ POLYPECTOMY  11/2007   (RTE) PCI/DES - Stents x 2 sequential- LAD 12/8/200  . CORONARY ANGIOPLASTY WITH STENT PLACEMENT     x 2  . ROTATOR CUFF REPAIR Left 1990    Allergies  Allergen Reactions  . Ace Inhibitors     Other reaction(s): Unknown  . Amlodipine     Other reaction(s): Unknown  . Norvasc [Amlodipine Besylate]     Generic Norvasc-->cough  . Simvastatin     Other reaction(s): Unknown  . Celebrex [Celecoxib] Rash    Hives, itching      Allergies as of 12/03/2017      Reactions   Ace Inhibitors    Other reaction(s): Unknown   Amlodipine    Other reaction(s): Unknown   Norvasc [amlodipine Besylate]  Generic Norvasc-->cough   Simvastatin    Other reaction(s): Unknown   Celebrex [celecoxib] Rash   Hives, itching       Medication List        Accurate as of 12/03/17 11:59 PM. Always use your most recent med list.          acetaminophen 650 MG CR tablet Commonly known as:  TYLENOL Take 650 mg by mouth every 12 (twelve) hours as needed for pain.   acetaminophen 325 MG tablet Commonly known as:  TYLENOL Take 650 mg by mouth every 6 (six) hours as needed for moderate pain.   amLODipine 10 MG tablet Commonly known as:  NORVASC Take 10 mg by mouth daily.     aspirin 81 MG chewable tablet Chew 81 mg by mouth daily.   cloNIDine 0.1 MG tablet Commonly known as:  CATAPRES Take 0.15 mg by mouth 2 (two) times daily. 1 and 1/2 tablets   DERMACLOUD Crea Apply liberal amount topically to area of skin irritation as needed. Ok to leave at bedside   dextrose 40 % Gel Commonly known as:  GLUTOSE Take 1 Tube by mouth every 30 (thirty) minutes as needed for low blood sugar. Until BS > 70 and pt is still responsive.   fluticasone 50 MCG/ACT nasal spray Commonly known as:  FLONASE Place 1 spray into both nostrils daily.   GLUCAGON EMERGENCY 1 MG injection Generic drug:  glucagon Inject 1 mg into the muscle once as needed. For hypoglycemia (symptomatic) not responding to oral glucose or snack. May repeat x 1 after 15 mins. Use only if pt is becoming unresponsive.   insulin lispro protamine-lispro (75-25) 100 UNIT/ML Susp injection Commonly known as:  HUMALOG 75/25 MIX Inject 12 Units into the skin 3 (three) times daily with meals.   isosorbide mononitrate 30 MG 24 hr tablet Commonly known as:  IMDUR TAKE 1 TABLET DAILY   latanoprost 0.005 % ophthalmic solution Commonly known as:  XALATAN Place 1 drop into both eyes at bedtime.   levothyroxine 50 MCG tablet Commonly known as:  SYNTHROID, LEVOTHROID Take 50 mcg by mouth daily before breakfast.   metFORMIN 500 MG tablet Commonly known as:  GLUCOPHAGE Take 500 mg by mouth 2 (two) times daily with a meal.   metoprolol succinate 100 MG 24 hr tablet Commonly known as:  TOPROL-XL Take 100 mg by mouth 2 (two) times daily. Take with or immediately following a meal.   MUSCLE RUB 10-15 % Crea Apply 1 application topically 2 (two) times daily as needed.   nitroGLYCERIN 0.4 MG SL tablet Commonly known as:  NITROSTAT Place 0.4 mg under the tongue every 5 (five) minutes as needed.   olmesartan-hydrochlorothiazide 40-25 MG tablet Commonly known as:  BENICAR HCT Take 1 tablet by mouth daily.    potassium chloride 8 MEQ tablet Commonly known as:  KLOR-CON Take 8 mEq by mouth 2 (two) times daily. Give with food. Do not crush   rosuvastatin 5 MG tablet Commonly known as:  CRESTOR Take 5 mg by mouth once a week. On sunday   senna-docusate 8.6-50 MG tablet Commonly known as:  Senokot-S Take 1 tablet by mouth 2 (two) times daily as needed for constipation.   traMADol 50 MG tablet Commonly known as:  ULTRAM Take 2 tablets (100 mg total) by mouth every 6 (six) hours as needed for moderate pain.       Review of Systems  Unable to perform ROS: Dementia  Constitutional: Negative for activity change,  appetite change, chills, diaphoresis and fever.  HENT: Negative for congestion, mouth sores, nosebleeds, postnasal drip, sneezing, sore throat, trouble swallowing and voice change.   Respiratory: Negative for apnea, cough, choking, chest tightness, shortness of breath and wheezing.   Cardiovascular: Negative for chest pain, palpitations and leg swelling.  Gastrointestinal: Negative for abdominal distention, abdominal pain, constipation, diarrhea and nausea.  Genitourinary: Negative for difficulty urinating, dysuria, frequency and urgency.  Musculoskeletal: Positive for arthralgias (typical arthritis). Negative for back pain, gait problem and myalgias.  Skin: Negative for color change, pallor, rash and wound.  Neurological: Positive for weakness. Negative for dizziness, tremors, syncope, speech difficulty, numbness and headaches.  Psychiatric/Behavioral: Negative for agitation and behavioral problems.  All other systems reviewed and are negative.   Immunization History  Administered Date(s) Administered  . Influenza-Unspecified 08/13/2015, 08/12/2016, 08/03/2017   Pertinent  Health Maintenance Due  Topic Date Due  . FOOT EXAM  08/28/1940  . OPHTHALMOLOGY EXAM  08/28/1940  . DEXA SCAN  08/28/1997  . PNA vac Low Risk Adult (1 of 2 - PCV13) 08/09/2018 (Originally 08/28/1997)  .  HEMOGLOBIN A1C  05/16/2018  . INFLUENZA VACCINE  Completed   No flowsheet data found. Functional Status Survey:    Vitals:   12/03/17 1301  BP: (!) 201/80  Pulse: 74  Resp: 20  Temp: 97.8 F (36.6 C)  TempSrc: Oral  SpO2: 95%  Weight: 158 lb 4.8 oz (71.8 kg)  Height: 5\' 4"  (1.626 m)   Body mass index is 27.17 kg/m. Physical Exam  Constitutional: She is oriented to person, place, and time. Vital signs are normal. She appears well-developed and well-nourished. She is active and cooperative. She does not appear ill. No distress.  HENT:  Head: Normocephalic and atraumatic.  Mouth/Throat: Uvula is midline, oropharynx is clear and moist and mucous membranes are normal. Mucous membranes are not pale, not dry and not cyanotic.  Eyes: Conjunctivae, EOM and lids are normal. Pupils are equal, round, and reactive to light.  Neck: Trachea normal, normal range of motion and full passive range of motion without pain. Neck supple. No JVD present. No tracheal deviation, no edema and no erythema present. No thyromegaly present.  Cardiovascular: Normal rate, regular rhythm, intact distal pulses and normal pulses. Exam reveals no gallop, no distant heart sounds and no friction rub.  Murmur heard. Pulses:      Dorsalis pedis pulses are 2+ on the right side, and 2+ on the left side.  1+ BLE pitting edema  Pulmonary/Chest: Effort normal and breath sounds normal. No accessory muscle usage. No respiratory distress. She has no decreased breath sounds. She has no wheezes. She has no rhonchi. She has no rales. She exhibits no tenderness.  Abdominal: Soft. Normal appearance and bowel sounds are normal. She exhibits no distension and no ascites. There is no tenderness.  Musculoskeletal: Normal range of motion. She exhibits no edema or tenderness.  Expected osteoarthritis, stiffness; Bilateral Calves soft, supple. Negative Homan's Sign. B- pedal pulses equal; generalized weakness; mobile on unit in wheelchair    Neurological: She is alert and oriented to person, place, and time. She has normal strength. Coordination and gait abnormal.  Skin: Skin is warm, dry and intact. She is not diaphoretic. No cyanosis. No pallor. Nails show no clubbing.  Psychiatric: She has a normal mood and affect. Her speech is normal and behavior is normal. Judgment and thought content normal. Cognition and memory are impaired. She exhibits abnormal recent memory.  Nursing note and vitals reviewed.  Labs reviewed: Recent Labs    01/01/17 1600 03/09/17 0816 11/16/17 0530  NA 128* 138 139  K 4.2 3.6 3.6  CL 90* 100* 100*  CO2 29 30 30   GLUCOSE 304* 180* 119*  BUN 37* 44* 34*  CREATININE 0.95 0.95 0.99  CALCIUM 8.3* 9.5 9.7  MG  --   --  2.0   Recent Labs    01/01/17 1600 03/09/17 0816 11/16/17 0530  AST 19 17 22   ALT 20 17 18   ALKPHOS 73 44 55  BILITOT 0.3 0.7 0.6  PROT 6.0* 7.2 7.5  ALBUMIN 2.3* 4.0 4.2   Recent Labs    01/01/17 1600 03/09/17 0816 11/16/17 0530  WBC 8.2 5.2 5.8  NEUTROABS 6.6* 3.3 2.8  HGB 10.0* 11.5* 13.5  HCT 30.2* 34.5* 40.4  MCV 94.1 98.9 95.1  PLT 312 170 160   Lab Results  Component Value Date   TSH 1.949 11/16/2017   Lab Results  Component Value Date   HGBA1C 8.3 (H) 11/16/2017   Lab Results  Component Value Date   CHOL 234 (H) 11/16/2017   HDL 45 11/16/2017   LDLCALC 144 (H) 11/16/2017   TRIG 223 (H) 11/16/2017   CHOLHDL 5.2 11/16/2017    Significant Diagnostic Results in last 30 days:  No results found.  Assessment/Plan Jody Dorsey was seen today for medical management of chronic issues.  Diagnoses and all orders for this visit:  Peripheral vascular disease, unspecified ([place])  Essential (primary) hypertension  Atherosclerosis of native coronary artery of native heart with angina pectoris (HCC)   CAD, PVD stable  Elevated B/Ps lately- adjust medications  Increase Clonidine 0.2 mg po BID  Monitor for ongoing HTN  Otherwise, continue current  medication regimen  Family/ staff Communication:   Total Time:  Documentation:  Face to Face:  Family/Phone:   Labs/tests ordered:  Reviewed labs obtained 1/8  Medication list reviewed and assessed for continued appropriateness. Monthly medication orders reviewed and signed.  Vikki Ports, NP-C Geriatrics [facility] [phone], [place] [address] Cell Phone (Mon-Fri 8am-5pm):  [phone] On Call:  [phone] & follow prompts after 5pm & weekends Office Phone:  [phone] Office Fax:  [phone]

## 2017-12-13 NOTE — Assessment & Plan Note (Signed)
Blood pressures elevated more as of late. Will increase Clonidine to 0.2 mg BID. Continue Norvasc, Benicar, metoprolol, potassium. No c/o chest pain or shortness of breath

## 2017-12-13 NOTE — Assessment & Plan Note (Signed)
Stable. B-pedal pulses equal. Cap refills in BLE WNL/ equal.

## 2018-01-04 ENCOUNTER — Non-Acute Institutional Stay (SKILLED_NURSING_FACILITY): Payer: Medicare Other | Admitting: Gerontology

## 2018-01-04 ENCOUNTER — Encounter: Payer: Self-pay | Admitting: Gerontology

## 2018-01-04 DIAGNOSIS — E039 Hypothyroidism, unspecified: Secondary | ICD-10-CM | POA: Diagnosis not present

## 2018-01-04 DIAGNOSIS — E639 Nutritional deficiency, unspecified: Secondary | ICD-10-CM | POA: Diagnosis not present

## 2018-01-04 DIAGNOSIS — S065X9A Traumatic subdural hemorrhage with loss of consciousness of unspecified duration, initial encounter: Secondary | ICD-10-CM | POA: Diagnosis not present

## 2018-01-04 DIAGNOSIS — S065XAA Traumatic subdural hemorrhage with loss of consciousness status unknown, initial encounter: Secondary | ICD-10-CM

## 2018-01-07 ENCOUNTER — Encounter
Admission: RE | Admit: 2018-01-07 | Discharge: 2018-01-07 | Disposition: A | Discharge status: Home / Self Care | Payer: Medicare Other | Source: Ambulatory Visit | Attending: Internal Medicine | Admitting: Internal Medicine

## 2018-01-26 ENCOUNTER — Other Ambulatory Visit
Admission: RE | Admit: 2018-01-26 | Discharge: 2018-01-26 | Disposition: A | Discharge status: Home / Self Care | Payer: Medicare Other | Source: Ambulatory Visit | Attending: Gerontology | Admitting: Gerontology

## 2018-01-26 DIAGNOSIS — R410 Disorientation, unspecified: Secondary | ICD-10-CM | POA: Diagnosis not present

## 2018-01-26 DIAGNOSIS — R8290 Unspecified abnormal findings in urine: Secondary | ICD-10-CM | POA: Insufficient documentation

## 2018-01-26 DIAGNOSIS — N189 Chronic kidney disease, unspecified: Secondary | ICD-10-CM | POA: Diagnosis present

## 2018-01-26 DIAGNOSIS — E1122 Type 2 diabetes mellitus with diabetic chronic kidney disease: Secondary | ICD-10-CM | POA: Insufficient documentation

## 2018-01-26 LAB — URINALYSIS, COMPLETE (UACMP) WITH MICROSCOPIC
Bilirubin Urine: NEGATIVE
Glucose, UA: NEGATIVE mg/dL
KETONES UR: NEGATIVE mg/dL
Leukocytes, UA: NEGATIVE
Nitrite: NEGATIVE
PROTEIN: 100 mg/dL — AB
Specific Gravity, Urine: 1.019 (ref 1.005–1.030)
pH: 5 (ref 5.0–8.0)

## 2018-01-28 LAB — URINE CULTURE

## 2018-02-03 ENCOUNTER — Non-Acute Institutional Stay (SKILLED_NURSING_FACILITY): Payer: Medicare Other | Admitting: Gerontology

## 2018-02-03 ENCOUNTER — Encounter: Payer: Self-pay | Admitting: Gerontology

## 2018-02-03 DIAGNOSIS — E1122 Type 2 diabetes mellitus with diabetic chronic kidney disease: Secondary | ICD-10-CM

## 2018-02-03 DIAGNOSIS — N183 Chronic kidney disease, stage 3 unspecified: Secondary | ICD-10-CM

## 2018-02-03 DIAGNOSIS — J309 Allergic rhinitis, unspecified: Secondary | ICD-10-CM | POA: Diagnosis not present

## 2018-02-03 DIAGNOSIS — R1312 Dysphagia, oropharyngeal phase: Secondary | ICD-10-CM

## 2018-02-03 DIAGNOSIS — Z794 Long term (current) use of insulin: Secondary | ICD-10-CM | POA: Diagnosis not present

## 2018-02-03 NOTE — Progress Notes (Signed)
Location:    [facility]ursing Home Room Number: 623J Place of Service:  SNF (31) Provider:  Toni Arthurs, NP-C  Kirk Ruths, MD  Patient Care Team: Kirk Ruths, MD as PCP - General (Internal Medicine) Ubaldo Glassing Javier Docker, MD as Consulting Physician (Cardiology)  Extended Emergency Contact Information Primary Emergency Contact: Drake,Karen S Address: [place], [place] [address] Johnnette Litter of [place] Phone: [phone] Work Phone: [phone] Relation: Daughter Secondary Emergency Contact: [place] of [place] Phone: [phone] Relation: Grandson  Code Status: DNR Goals of care: Advanced Directive information Advanced Directives 02/03/2018  Does Patient Have a Medical Advance Directive? Yes  Type of Paramedic of [place];Out of facility DNR (pink MOST or yellow form)  Does patient want to make changes to medical advance directive? No - Patient declined  Copy of [place] in Chart? Yes  Pre-existing out of facility DNR order (yellow form or pink MOST form) Yellow form placed in chart (order not valid for inpatient use)     Chief Complaint  Patient presents with  . Medical Management of Chronic Issues    Routine Visit    HPI:  Jody Dorsey is a 84 y.o. female seen today for medical management of chronic diseases.    Hypothyroidism Stable.  Symptoms controlled with levothyroxine 50 mcg p.o. daily.  Last TSH check was on 11/16/2017.  Level was 1.91.  SDH (subdural hematoma) Resolved.  Patient is at baseline strength and cognition.  No recent falls.  No head trauma.  Nutritional deficiency Resolved.  Albumin 4.2 with lab checks last month.  Protein 7.5.  Patient's weight has remained stable over the past few months.  Average meal intake 75-100%.  Please note Jody Dorsey with limited verbal ability. Unable to obtain complete ROS. Some ROS info obtained from staff and documentation.  Past  Medical History:  Diagnosis Date  . CAD (coronary artery disease)    status post 2 stents (Dr. Claiborne Billings, Dr. Elisabeth Cara, Dr. Fath-cardiologist) 2008; MI, 10/2008  . Colonic adenoma    unspecified  . Diabetes mellitus type 2, uncomplicated ([facility])   . DM (diabetes mellitus) ([place])   . Glaucoma   . HTN (hypertension)   . Microalbuminuria    diabetic  . Osteoarthritis   . Thyroid disease    Past Surgical History:  Procedure Laterality Date  . ABDOMINAL HYSTERECTOMY    . APPENDECTOMY  1949  . CATARACT EXTRACTION     left eye  . CERVICAL LAMINECTOMY  1994  . CESAREAN SECTION     x 3  . COLONOSCOPY W/ POLYPECTOMY  11/2007   (RTE) PCI/DES - Stents x 2 sequential- LAD 12/8/200  . CORONARY ANGIOPLASTY WITH STENT PLACEMENT     x 2  . ROTATOR CUFF REPAIR Left 1990    Allergies  Allergen Reactions  . Ace Inhibitors     Other reaction(s): Unknown  . Amlodipine     Other reaction(s): Unknown  . Norvasc [Amlodipine Besylate]     Generic Norvasc-->cough  . Simvastatin     Other reaction(s): Unknown  . Celebrex [Celecoxib] Rash    Hives, itching      Allergies as of 01/04/2018      Reactions   Ace Inhibitors    Other reaction(s): Unknown   Amlodipine    Other reaction(s): Unknown   Norvasc [amlodipine Besylate]    Generic Norvasc-->cough   Simvastatin    Other  reaction(s): Unknown   Celebrex [celecoxib] Rash   Hives, itching       Medication List        Accurate as of 01/04/18 11:59 PM. Always use your most recent med list.          acetaminophen 650 MG CR tablet Commonly known as:  TYLENOL Take 650 mg by mouth every 12 (twelve) hours as needed for pain.   acetaminophen 325 MG tablet Commonly known as:  TYLENOL Take 650 mg by mouth every 6 (six) hours as needed for moderate pain. do not exceed 3grams in 24hr period   amLODipine 10 MG tablet Commonly known as:  NORVASC Take 10 mg by mouth daily.   aspirin 81 MG chewable tablet Chew 81 mg by mouth daily.     cloNIDine 0.1 MG tablet Commonly known as:  CATAPRES Take 0.2 mg by mouth 2 (two) times daily. 2 tablets - Note Please let Larene [place] know when med order is due to be re-newed from pharmacy- new dose to be called in.)   DERMACLOUD Crea Apply liberal amount topically to area of skin irritation as needed. Ok to leave at bedside   dextrose 40 % Gel Commonly known as:  GLUTOSE Take 1 Tube by mouth as needed for low blood sugar. Give 1 tube every 30 minutes Until BS > 70 and Jody Dorsey is still responsive.   fluticasone 50 MCG/ACT nasal spray Commonly known as:  FLONASE Place 1 spray into both nostrils daily.   GLUCAGON EMERGENCY 1 MG injection Generic drug:  glucagon Inject 1 mg into the muscle once as needed. For hypoglycemia (symptomatic) not responding to oral glucose or snack. May repeat x 1 after 15 mins. Use only if Jody Dorsey is becoming unresponsive.   guaiFENesin 100 MG/5ML liquid Commonly known as:  ROBITUSSIN Take 200 mg by mouth every 4 (four) hours as needed for cough. Notify provider if cough worsens/ continues longer than 3 days.   insulin lispro protamine-lispro (75-25) 100 UNIT/ML Susp injection Commonly known as:  HUMALOG 75/25 MIX Inject 12 Units into the skin 3 (three) times daily with meals.   isosorbide mononitrate 30 MG 24 hr tablet Commonly known as:  IMDUR TAKE 1 TABLET DAILY   latanoprost 0.005 % ophthalmic solution Commonly known as:  XALATAN Place 1 drop into both eyes at bedtime.   levothyroxine 50 MCG tablet Commonly known as:  SYNTHROID, LEVOTHROID Take 50 mcg by mouth daily before breakfast.   metFORMIN 500 MG tablet Commonly known as:  GLUCOPHAGE Take 500 mg by mouth 2 (two) times daily with a meal.   metoprolol succinate 100 MG 24 hr tablet Commonly known as:  TOPROL-XL Take 100 mg by mouth 2 (two) times daily. Take with or immediately following a meal.   MUSCLE RUB 10-15 % Crea Apply 1 application topically 2 (two) times daily as needed.   nitroGLYCERIN  0.4 MG SL tablet Commonly known as:  NITROSTAT Place 0.4 mg under the tongue every 5 (five) minutes as needed for chest pain.   olmesartan-hydrochlorothiazide 40-25 MG tablet Commonly known as:  BENICAR HCT Take 1 tablet by mouth daily.   potassium chloride 8 MEQ tablet Commonly known as:  KLOR-CON Take 8 mEq by mouth 2 (two) times daily. Give with food. Do not crush   rosuvastatin 5 MG tablet Commonly known as:  CRESTOR Take 5 mg by mouth once a week. On sunday   senna-docusate 8.6-50 MG tablet Commonly known as:  Senokot-S Take 1 tablet by mouth  2 (two) times daily as needed for constipation.   traMADol 50 MG tablet Commonly known as:  ULTRAM Take 2 tablets (100 mg total) by mouth every 6 (six) hours as needed for moderate pain.       Review of Systems  Unable to perform ROS: Dementia  Constitutional: Negative for activity change, appetite change, chills, diaphoresis and fever.  HENT: Negative for congestion, mouth sores, nosebleeds, postnasal drip, sneezing, sore throat, trouble swallowing and voice change.   Respiratory: Negative for apnea, cough, choking, chest tightness, shortness of breath and wheezing.   Cardiovascular: Negative for chest pain, palpitations and leg swelling.  Gastrointestinal: Negative for abdominal distention, abdominal pain, constipation, diarrhea and nausea.  Genitourinary: Negative for difficulty urinating, dysuria, frequency and urgency.  Musculoskeletal: Positive for arthralgias (typical arthritis) and gait problem. Negative for back pain and myalgias.  Skin: Negative for color change, pallor, rash and wound.  Neurological: Positive for weakness. Negative for dizziness, tremors, syncope, speech difficulty, numbness and headaches.  Psychiatric/Behavioral: Negative for agitation and behavioral problems.  All other systems reviewed and are negative.   Immunization History  Administered Date(s) Administered  . Influenza-Unspecified 08/13/2015,  08/12/2016, 08/03/2017   Pertinent  Health Maintenance Due  Topic Date Due  . FOOT EXAM  08/28/1940  . OPHTHALMOLOGY EXAM  08/28/1940  . DEXA SCAN  08/28/1997  . PNA vac Low Risk Adult (1 of 2 - PCV13) 08/09/2018 (Originally 08/28/1997)  . HEMOGLOBIN A1C  05/16/2018  . INFLUENZA VACCINE  Completed   No flowsheet data found. Functional Status Survey:    Vitals:   01/04/18 1110  BP: (!) 156/88  Pulse: 76  Resp: 18  Temp: 98.3 F (36.8 C)  TempSrc: Oral  SpO2: 99%  Weight: 162 lb 3.2 oz (73.6 kg)  Height: 5\' 4"  (1.626 m)   Body mass index is 27.84 kg/m. Physical Exam  Constitutional: Vital signs are normal. Jody Dorsey appears well-developed and well-nourished. Jody Dorsey is active and cooperative. Jody Dorsey does not appear ill. No distress.  HENT:  Head: Normocephalic and atraumatic.  Mouth/Throat: Uvula is midline, oropharynx is clear and moist and mucous membranes are normal. Mucous membranes are not pale, not dry and not cyanotic.  Eyes: Pupils are equal, round, and reactive to light. Conjunctivae, EOM and lids are normal.  Neck: Trachea normal, normal range of motion and full passive range of motion without pain. Neck supple. No JVD present. No tracheal deviation, no edema and no erythema present. No thyromegaly present.  Cardiovascular: Normal rate, regular rhythm, normal heart sounds, intact distal pulses and normal pulses. Exam reveals no gallop, no distant heart sounds and no friction rub.  No murmur heard. Pulses:      Dorsalis pedis pulses are 2+ on the right side, and 2+ on the left side.  No edema  Pulmonary/Chest: Effort normal and breath sounds normal. No accessory muscle usage. No respiratory distress. Jody Dorsey has no decreased breath sounds. Jody Dorsey has no wheezes. Jody Dorsey has no rhonchi. Jody Dorsey has no rales. Jody Dorsey exhibits no tenderness.  Abdominal: Soft. Normal appearance and bowel sounds are normal. Jody Dorsey exhibits no distension and no ascites. There is no tenderness.  Musculoskeletal: Normal range  of motion. Jody Dorsey exhibits no edema or tenderness.  Expected osteoarthritis, stiffness; Bilateral Calves soft, supple. Negative Homan's Sign. B- pedal pulses equal; generalized weakness, mobile on unit in wheelchair  Neurological: Jody Dorsey is alert. Jody Dorsey has normal strength. Jody Dorsey displays atrophy. Jody Dorsey exhibits abnormal muscle tone. Coordination and gait abnormal.  Skin: Skin is warm, dry and  intact. Jody Dorsey is not diaphoretic. No cyanosis. No pallor. Nails show no clubbing.  Psychiatric: Judgment and thought content normal. Jody Dorsey affect is blunt. Jody Dorsey speech is delayed. Jody Dorsey is slowed and withdrawn. Cognition and memory are impaired. Jody Dorsey exhibits abnormal recent memory.  Nursing note and vitals reviewed.   Labs reviewed: Recent Labs    03/09/17 0816 11/16/17 0530  NA 138 139  K 3.6 3.6  CL 100* 100*  CO2 30 30  GLUCOSE 180* 119*  BUN 44* 34*  CREATININE 0.95 0.99  CALCIUM 9.5 9.7  MG  --  2.0   Recent Labs    03/09/17 0816 11/16/17 0530  AST 17 22  ALT 17 18  ALKPHOS 44 55  BILITOT 0.7 0.6  PROT 7.2 7.5  ALBUMIN 4.0 4.2   Recent Labs    03/09/17 0816 11/16/17 0530  WBC 5.2 5.8  NEUTROABS 3.3 2.8  HGB 11.5* 13.5  HCT 34.5* 40.4  MCV 98.9 95.1  PLT 170 160   Lab Results  Component Value Date   TSH 1.949 11/16/2017   Lab Results  Component Value Date   HGBA1C 8.3 (H) 11/16/2017   Lab Results  Component Value Date   CHOL 234 (H) 11/16/2017   HDL 45 11/16/2017   LDLCALC 144 (H) 11/16/2017   TRIG 223 (H) 11/16/2017   CHOLHDL 5.2 11/16/2017    Significant Diagnostic Results in last 30 days:  No results found.  Assessment/Plan Jody Dorsey was seen today for medical management of chronic issues.  Diagnoses and all orders for this visit:  Hypothyroidism, unspecified type  SDH (subdural hematoma) ([facility])  Nutritional deficiency   Above listed conditions stable  Continue current medication regimen  Recheck TSH in 5 months  Subdural hematoma resolved  Nutritional  deficiency resolved, but will continue to monitor  Safety precautions  Fall precautions  Continue to encourage patient to participate in activities and interactions with other residents   Family/ staff Communication:   Total Time:  Documentation:  Face to Face:  Family/Phone:   Labs/tests ordered: None due  Medication list reviewed and assessed for continued appropriateness. Monthly medication orders reviewed and signed.  Vikki Ports, NP-C Geriatrics [facility] [phone], [place] [address] Cell Phone (Mon-Fri 8am-5pm):  [phone] On Call:  [phone] & follow prompts after 5pm & weekends Office Phone:  [phone] Office Fax:  [phone]

## 2018-02-03 NOTE — Assessment & Plan Note (Signed)
Stable.  FS BS assessment AC and at bedtime, as needed.  Blood sugars have been stable.  Symptoms controlled with Humalog 75/25 mix 12 units 3 times daily with meals, metformin 500 mg p.o. twice daily.  No recent episodes of hyper or hypoglycemia.  P.o. fluid intake encouraged.  Medications renally adjusted as appropriate

## 2018-02-03 NOTE — Assessment & Plan Note (Signed)
Stable.  No recent episodes of aspiration.  Patient is on a no concentrated sweets/regular diet with added moisture and gravy.

## 2018-02-03 NOTE — Progress Notes (Signed)
Location:    Nursing Home Room Number: 786V Place of Service:  SNF (31) Provider:  Toni Arthurs, NP-C  Kirk Ruths, MD  Patient Care Team: Kirk Ruths, MD as PCP - General (Internal Medicine) Ubaldo Glassing Javier Docker, MD as Consulting Physician (Cardiology)  Extended Emergency Contact Information Primary Emergency Contact: Drake,Karen S Address: [place], [place] [address] Johnnette Litter of [place] Phone: [phone] Work Phone: [phone] Relation: Daughter Secondary Emergency Contact: [place] of [place] Phone: [phone] Relation: Grandson  Code Status: DNR Goals of care: Advanced Directive information Advanced Directives 02/03/2018  Does Patient Have a Medical Advance Directive? Yes  Type of Paramedic of [place];Out of facility DNR (pink MOST or yellow form)  Does patient want to make changes to medical advance directive? No - Patient declined  Copy of [place] in Chart? Yes  Pre-existing out of facility DNR order (yellow form or pink MOST form) Yellow form placed in chart (order not valid for inpatient use)     Chief Complaint  Patient presents with  . Medical Management of Chronic Issues    Routine Visit    HPI:  Pt is a 84 y.o. female seen today for medical management of chronic diseases.    Dysphagia, oropharyngeal phase Stable.  No recent episodes of aspiration.  Patient is on a no concentrated sweets/regular diet with added moisture and gravy.  Allergic rhinitis Stable.  No signs or symptoms of allergic rhinitis exacerbation.  Symptoms controlled with Flonase 50 mcg spray, 1 spray each nostril daily.  Type 2 diabetes mellitus with stage 3 chronic kidney disease (HCC) Stable.  FS BS assessment AC and at bedtime, as needed.  Blood sugars have been stable.  Symptoms controlled with Humalog 75/25 mix 12 units 3 times daily with meals, metformin 500 mg p.o.  twice daily.  No recent episodes of hyper or hypoglycemia.  P.o. fluid intake encouraged.  Medications renally adjusted as appropriate   Please note pt with limited verbal ability. Unable to obtain complete ROS. Some ROS info obtained from staff and documentation.   Past Medical History:  Diagnosis Date  . CAD (coronary artery disease)    status post 2 stents (Dr. Claiborne Billings, Dr. Elisabeth Cara, Dr. Fath-cardiologist) 2008; MI, 10/2008  . Colonic adenoma    unspecified  . Diabetes mellitus type 2, uncomplicated ([place])   . DM (diabetes mellitus) ([place])   . Glaucoma   . HTN (hypertension)   . Microalbuminuria    diabetic  . Osteoarthritis   . Thyroid disease    Past Surgical History:  Procedure Laterality Date  . ABDOMINAL HYSTERECTOMY    . APPENDECTOMY  1949  . CATARACT EXTRACTION     left eye  . CERVICAL LAMINECTOMY  1994  . CESAREAN SECTION     x 3  . COLONOSCOPY W/ POLYPECTOMY  11/2007   (RTE) PCI/DES - Stents x 2 sequential- LAD 12/8/200  . CORONARY ANGIOPLASTY WITH STENT PLACEMENT     x 2  . ROTATOR CUFF REPAIR Left 1990    Allergies  Allergen Reactions  . Ace Inhibitors     Other reaction(s): Unknown  . Amlodipine     Other reaction(s): Unknown  . Norvasc [Amlodipine Besylate]     Generic Norvasc-->cough  . Simvastatin     Other reaction(s): Unknown  . Celebrex [Celecoxib] Rash    Hives, itching  Allergies as of 02/03/2018      Reactions   Ace Inhibitors    Other reaction(s): Unknown   Amlodipine    Other reaction(s): Unknown   Norvasc [amlodipine Besylate]    Generic Norvasc-->cough   Simvastatin    Other reaction(s): Unknown   Celebrex [celecoxib] Rash   Hives, itching       Medication List        Accurate as of 02/03/18  3:51 PM. Always use your most recent med list.          acetaminophen 650 MG CR tablet Commonly known as:  TYLENOL Take 650 mg by mouth every 12 (twelve) hours as needed for pain.   acetaminophen 325 MG tablet Commonly known  as:  TYLENOL Take 650 mg by mouth every 6 (six) hours as needed for moderate pain. do not exceed 3grams in 24hr period   amLODipine 10 MG tablet Commonly known as:  NORVASC Take 10 mg by mouth daily.   aspirin 81 MG chewable tablet Chew 81 mg by mouth daily.   cloNIDine 0.1 MG tablet Commonly known as:  CATAPRES Take 0.2 mg by mouth 2 (two) times daily. 2 tablets - Note Please let Larene [place] know when med order is due to be re-newed from pharmacy- new dose to be called in.)   DERMACLOUD Crea Apply liberal amount topically to area of skin irritation as needed. Ok to leave at bedside   dextrose 40 % Gel Commonly known as:  GLUTOSE Take 1 Tube by mouth as needed for low blood sugar. Give 1 tube every 30 minutes Until BS > 70 and pt is still responsive.   fluticasone 50 MCG/ACT nasal spray Commonly known as:  FLONASE Place 1 spray into both nostrils daily.   GLUCAGON EMERGENCY 1 MG injection Generic drug:  glucagon Inject 1 mg into the muscle once as needed. For hypoglycemia (symptomatic) not responding to oral glucose or snack. May repeat x 1 after 15 mins. Use only if pt is becoming unresponsive.   insulin lispro protamine-lispro (75-25) 100 UNIT/ML Susp injection Commonly known as:  HUMALOG 75/25 MIX Inject 12 Units into the skin 3 (three) times daily with meals.   isosorbide mononitrate 30 MG 24 hr tablet Commonly known as:  IMDUR TAKE 1 TABLET DAILY   latanoprost 0.005 % ophthalmic solution Commonly known as:  XALATAN Place 1 drop into both eyes at bedtime.   levothyroxine 50 MCG tablet Commonly known as:  SYNTHROID, LEVOTHROID Take 50 mcg by mouth daily before breakfast.   metFORMIN 500 MG tablet Commonly known as:  GLUCOPHAGE Take 500 mg by mouth 2 (two) times daily with a meal.   metoprolol succinate 100 MG 24 hr tablet Commonly known as:  TOPROL-XL Take 100 mg by mouth 2 (two) times daily. Take with or immediately following a meal.   MUSCLE RUB 10-15 %  Crea Apply 1 application topically 2 (two) times daily as needed.   nitroGLYCERIN 0.4 MG SL tablet Commonly known as:  NITROSTAT Place 0.4 mg under the tongue every 5 (five) minutes as needed for chest pain.   olmesartan-hydrochlorothiazide 40-25 MG tablet Commonly known as:  BENICAR HCT Take 1 tablet by mouth daily.   potassium chloride 8 MEQ tablet Commonly known as:  KLOR-CON Take 8 mEq by mouth 2 (two) times daily. Give with food. Do not crush   rosuvastatin 5 MG tablet Commonly known as:  CRESTOR Take 5 mg by mouth once a week. On sunday   senna-docusate 8.6-50  MG tablet Commonly known as:  Senokot-S Take 1 tablet by mouth 2 (two) times daily as needed for constipation.   traMADol 50 MG tablet Commonly known as:  ULTRAM Take 2 tablets (100 mg total) by mouth every 6 (six) hours as needed for moderate pain.   Vitamin D3 2000 units capsule Take 2,000 Units by mouth daily.       Review of Systems  Unable to perform ROS: Dementia  Constitutional: Negative for activity change, appetite change, chills, diaphoresis and fever.  HENT: Negative for congestion, mouth sores, nosebleeds, postnasal drip, sneezing, sore throat, trouble swallowing and voice change.   Respiratory: Negative for apnea, cough, choking, chest tightness, shortness of breath and wheezing.   Cardiovascular: Negative for chest pain, palpitations and leg swelling.  Gastrointestinal: Negative for abdominal distention, abdominal pain, constipation, diarrhea and nausea.  Genitourinary: Negative for difficulty urinating, dysuria, frequency and urgency.  Musculoskeletal: Positive for arthralgias (typical arthritis) and gait problem. Negative for back pain and myalgias.  Skin: Negative for color change, pallor, rash and wound.  Neurological: Positive for weakness. Negative for dizziness, tremors, syncope, speech difficulty, numbness and headaches.  Psychiatric/Behavioral: Negative for agitation and behavioral  problems.  All other systems reviewed and are negative.   Immunization History  Administered Date(s) Administered  . Influenza-Unspecified 08/13/2015, 08/12/2016, 08/03/2017   Pertinent  Health Maintenance Due  Topic Date Due  . FOOT EXAM  08/28/1940  . OPHTHALMOLOGY EXAM  08/28/1940  . DEXA SCAN  08/28/1997  . PNA vac Low Risk Adult (1 of 2 - PCV13) 08/09/2018 (Originally 08/28/1997)  . HEMOGLOBIN A1C  05/16/2018  . INFLUENZA VACCINE  Completed   No flowsheet data found. Functional Status Survey:    Vitals:   02/03/18 0937  BP: (!) 190/88  Pulse: 83  Resp: 16  Temp: 98 F (36.7 C)  TempSrc: Oral  SpO2: 96%  Weight: 161 lb 8 oz (73.3 kg)  Height: 5\' 4"  (1.626 m)   Body mass index is 27.72 kg/m. Physical Exam  Constitutional: Vital signs are normal. She appears well-developed and well-nourished. She is active and cooperative. She does not appear ill. No distress.  HENT:  Head: Normocephalic and atraumatic.  Mouth/Throat: Uvula is midline, oropharynx is clear and moist and mucous membranes are normal. Mucous membranes are not pale, not dry and not cyanotic.  Eyes: Pupils are equal, round, and reactive to light. Conjunctivae, EOM and lids are normal.  Neck: Trachea normal, normal range of motion and full passive range of motion without pain. Neck supple. No JVD present. No tracheal deviation, no edema and no erythema present. No thyromegaly present.  Cardiovascular: Normal rate, regular rhythm, normal heart sounds, intact distal pulses and normal pulses. Exam reveals no gallop, no distant heart sounds and no friction rub.  No murmur heard. Pulses:      Dorsalis pedis pulses are 2+ on the right side, and 2+ on the left side.  No edema  Pulmonary/Chest: Effort normal and breath sounds normal. No accessory muscle usage. No respiratory distress. She has no decreased breath sounds. She has no wheezes. She has no rhonchi. She has no rales. She exhibits no tenderness.   Abdominal: Soft. Normal appearance and bowel sounds are normal. She exhibits no distension and no ascites. There is no tenderness.  Musculoskeletal: Normal range of motion. She exhibits no edema or tenderness.  Expected osteoarthritis, stiffness; Bilateral Calves soft, supple. Negative Homan's Sign. B- pedal pulses equal; generalized weakness, mobile on unit in wheelchair  Neurological: She  is alert. She has normal strength. A sensory deficit is present. She exhibits abnormal muscle tone. Coordination and gait abnormal.  Skin: Skin is warm, dry and intact. She is not diaphoretic. No cyanosis. No pallor. Nails show no clubbing.  Psychiatric: Judgment and thought content normal. Her affect is blunt. Her speech is delayed. She is slowed and withdrawn. Cognition and memory are impaired. She exhibits abnormal recent memory.  Nursing note and vitals reviewed.   Labs reviewed: Recent Labs    03/09/17 0816 11/16/17 0530  NA 138 139  K 3.6 3.6  CL 100* 100*  CO2 30 30  GLUCOSE 180* 119*  BUN 44* 34*  CREATININE 0.95 0.99  CALCIUM 9.5 9.7  MG  --  2.0   Recent Labs    03/09/17 0816 11/16/17 0530  AST 17 22  ALT 17 18  ALKPHOS 44 55  BILITOT 0.7 0.6  PROT 7.2 7.5  ALBUMIN 4.0 4.2   Recent Labs    03/09/17 0816 11/16/17 0530  WBC 5.2 5.8  NEUTROABS 3.3 2.8  HGB 11.5* 13.5  HCT 34.5* 40.4  MCV 98.9 95.1  PLT 170 160   Lab Results  Component Value Date   TSH 1.949 11/16/2017   Lab Results  Component Value Date   HGBA1C 8.3 (H) 11/16/2017   Lab Results  Component Value Date   CHOL 234 (H) 11/16/2017   HDL 45 11/16/2017   LDLCALC 144 (H) 11/16/2017   TRIG 223 (H) 11/16/2017   CHOLHDL 5.2 11/16/2017    Significant Diagnostic Results in last 30 days:  No results found.  Assessment/Plan Jody Dorsey was seen today for medical management of chronic issues.  Diagnoses and all orders for this visit:  Dysphagia, oropharyngeal phase  Allergic rhinitis, unspecified  seasonality, unspecified trigger  Type 2 diabetes mellitus with stage 3 chronic kidney disease, with long-term current use of insulin ([place])   Above listed conditions stable  Continue current medication regimen  Monitor closely for episodes of hyper or hypoglycemia  Assess feet and skin integrity with daily bath  Supervision with meals  Monitor for coughing/choking with p.o. intake  Encourage p.o. fluid intake for hydration  Family/ staff Communication:   Total Time:  Documentation:  Face to Face:  Family/Phone:   Labs/tests ordered: Not due  Medication list reviewed and assessed for continued appropriateness. Monthly medication orders reviewed and signed.  Vikki Ports, NP-C Geriatrics [facility] [phone], [place] [address] Cell Phone (Mon-Fri 8am-5pm):  [phone] On Call:  [phone] & follow prompts after 5pm & weekends Office Phone:  [phone] Office Fax:  [phone]

## 2018-02-03 NOTE — Assessment & Plan Note (Signed)
Resolved.  Albumin 4.2 with lab checks last month.  Protein 7.5.  Patient's weight has remained stable over the past few months.  Average meal intake 75-100%.

## 2018-02-03 NOTE — Assessment & Plan Note (Signed)
Resolved.  Patient is at baseline strength and cognition.  No recent falls.  No head trauma.

## 2018-02-03 NOTE — Assessment & Plan Note (Signed)
Stable.  No signs or symptoms of allergic rhinitis exacerbation.  Symptoms controlled with Flonase 50 mcg spray, 1 spray each nostril daily.

## 2018-02-03 NOTE — Assessment & Plan Note (Signed)
Stable.  Symptoms controlled with levothyroxine 50 mcg p.o. daily.  Last TSH check was on 11/16/2017.  Level was 1.91.

## 2018-02-07 ENCOUNTER — Encounter
Admission: RE | Admit: 2018-02-07 | Discharge: 2018-02-07 | Disposition: A | Discharge status: Home / Self Care | Payer: Medicare Other | Source: Ambulatory Visit | Attending: Internal Medicine | Admitting: Internal Medicine

## 2018-03-07 ENCOUNTER — Non-Acute Institutional Stay (SKILLED_NURSING_FACILITY): Payer: Medicare Other | Admitting: Gerontology

## 2018-03-07 DIAGNOSIS — E1122 Type 2 diabetes mellitus with diabetic chronic kidney disease: Secondary | ICD-10-CM

## 2018-03-07 DIAGNOSIS — E039 Hypothyroidism, unspecified: Secondary | ICD-10-CM | POA: Diagnosis not present

## 2018-03-07 DIAGNOSIS — N183 Chronic kidney disease, stage 3 unspecified: Secondary | ICD-10-CM

## 2018-03-07 DIAGNOSIS — S065XAA Traumatic subdural hemorrhage with loss of consciousness status unknown, initial encounter: Secondary | ICD-10-CM

## 2018-03-07 DIAGNOSIS — Z794 Long term (current) use of insulin: Secondary | ICD-10-CM | POA: Diagnosis not present

## 2018-03-07 DIAGNOSIS — S065X9A Traumatic subdural hemorrhage with loss of consciousness of unspecified duration, initial encounter: Secondary | ICD-10-CM

## 2018-03-09 ENCOUNTER — Encounter
Admission: RE | Admit: 2018-03-09 | Discharge: 2018-03-09 | Disposition: A | Discharge status: Home / Self Care | Payer: Medicare Other | Source: Ambulatory Visit | Attending: Internal Medicine | Admitting: Internal Medicine

## 2018-03-15 NOTE — Assessment & Plan Note (Signed)
Variable os of late. Recently having continuous elevated blood sugars. Asymptomatic. No episodes of hypoglycemia. Currently on Metformin 500 mg po BID and Humalog 75/25 12 units with each meal.

## 2018-03-15 NOTE — Assessment & Plan Note (Addendum)
Stable. No changes in mental status, visual changes, etc. Denies headaches or dizziness. Mobile in wheelchair. On skilled Nursing unit.

## 2018-03-15 NOTE — Assessment & Plan Note (Signed)
Stable. Lab result in January 1.949 on 50 mcg Synthroid po Q Day. No concerns.

## 2018-03-15 NOTE — Progress Notes (Signed)
Location:      Place of Service:  SNF (31) Provider:  Toni Arthurs, NP-C  Kirk Ruths, MD  Patient Care Team: Kirk Ruths, MD as PCP - General (Internal Medicine) Ubaldo Glassing Javier Docker, MD as Consulting Physician (Cardiology)  Extended Emergency Contact Information Primary Emergency Contact: Drake,Karen S Address: [place], [place] [address] Johnnette Litter of [place] Phone: [phone] Work Phone: [phone] Relation: Daughter Secondary Emergency Contact: [place] of [place] Phone: [phone] Relation: Grandson  Code Status:  DNR Goals of care: Advanced Directive information Advanced Directives 02/03/2018  Does Patient Have a Medical Advance Directive? Yes  Type of Par[facility] of [place];Out of facility DNR (pink MOST or yellow form)  Does patient want to make changes to medical advance directive? No - Patient declined  Copy of [place] in Chart? Yes  Pre-existing out of facility DNR order (yellow form or pink MOST form) Yellow form placed in chart (order not valid for inpatient use)     Chief Complaint  Patient presents with  . Medical Management of Chronic Issues    HPI:  Pt is a 84 y.o. female seen today for medical management of chronic diseases.    Type 2 diabetes mellitus with stage 3 chronic kidney disease (HCC) Variable os of late. Recently having continuous elevated blood sugars. Asymptomatic. No episodes of hypoglycemia. Currently on Metformin 500 mg po BID and Humalog 75/25 12 units with each meal.   Hypothyroidism Stable. Lab result in January 1.949 on 50 mcg Synthroid po Q Day. No concerns.  SDH (subdural hematoma) Stable. No changes in mental status, visual changes, etc. Denies headaches or dizziness. Mobile in wheelchair. On skilled Nursing unit.    Past Medical History:  Diagnosis Date  . CAD (coronary artery disease)    status post 2 stents  (Dr. Claiborne Billings, Dr. Elisabeth Cara, Dr. Fath-cardiologist) 2008; MI, 10/2008  . Colonic adenoma    unspecified  . Diabetes mellitus type 2, uncomplicated ([place])   . DM (diabetes mellitus) ([place])   . Glaucoma   . HTN (hypertension)   . Microalbuminuria    diabetic  . Osteoarthritis   . Thyroid disease    Past Surgical History:  Procedure Laterality Date  . ABDOMINAL HYSTERECTOMY    . APPENDECTOMY  1949  . CATARACT EXTRACTION     left eye  . CERVICAL LAMINECTOMY  1994  . CESAREAN SECTION     x 3  . COLONOSCOPY W/ POLYPECTOMY  11/2007   (RTE) PCI/DES - Stents x 2 sequential- LAD 12/8/200  . CORONARY ANGIOPLASTY WITH STENT PLACEMENT     x 2  . ROTATOR CUFF REPAIR Left 1990    Allergies  Allergen Reactions  . Ace Inhibitors     Other reaction(s): Unknown  . Amlodipine     Other reaction(s): Unknown  . Norvasc [Amlodipine Besylate]     Generic Norvasc-->cough  . Simvastatin     Other reaction(s): Unknown  . Celebrex [Celecoxib] Rash    Hives, itching      Allergies as of 03/07/2018      Reactions   Ace Inhibitors    Other reaction(s): Unknown   Amlodipine    Other reaction(s): Unknown   Norvasc [amlodipine Besylate]    Generic Norvasc-->cough   Simvastatin    Other reaction(s): Unknown   Celebrex [celecoxib] Rash   Hives, itching  Medication List        Accurate as of 03/07/18 11:59 PM. Always use your most recent med list.          acetaminophen 650 MG CR tablet Commonly known as:  TYLENOL Take 650 mg by mouth every 12 (twelve) hours as needed for pain.   acetaminophen 325 MG tablet Commonly known as:  TYLENOL Take 650 mg by mouth every 6 (six) hours as needed for moderate pain. do not exceed 3grams in 24hr period   amLODipine 10 MG tablet Commonly known as:  NORVASC Take 10 mg by mouth daily.   aspirin 81 MG chewable tablet Chew 81 mg by mouth daily.   cloNIDine 0.1 MG tablet Commonly known as:  CATAPRES Take 0.2 mg by mouth 2 (two) times daily. 2  tablets - Note Please let Larene [place] know when med order is due to be re-newed from pharmacy- new dose to be called in.)   DERMACLOUD Crea Apply liberal amount topically to area of skin irritation as needed. Ok to leave at bedside   dextrose 40 % Gel Commonly known as:  GLUTOSE Take 1 Tube by mouth as needed for low blood sugar. Give 1 tube every 30 minutes Until BS > 70 and pt is still responsive.   fluticasone 50 MCG/ACT nasal spray Commonly known as:  FLONASE Place 1 spray into both nostrils daily.   GLUCAGON EMERGENCY 1 MG injection Generic drug:  glucagon Inject 1 mg into the muscle once as needed. For hypoglycemia (symptomatic) not responding to oral glucose or snack. May repeat x 1 after 15 mins. Use only if pt is becoming unresponsive.   insulin lispro protamine-lispro (75-25) 100 UNIT/ML Susp injection Commonly known as:  HUMALOG 75/25 MIX Inject 12 Units into the skin 3 (three) times daily with meals.   isosorbide mononitrate 30 MG 24 hr tablet Commonly known as:  IMDUR TAKE 1 TABLET DAILY   latanoprost 0.005 % ophthalmic solution Commonly known as:  XALATAN Place 1 drop into both eyes at bedtime.   levothyroxine 50 MCG tablet Commonly known as:  SYNTHROID, LEVOTHROID Take 50 mcg by mouth daily before breakfast.   metFORMIN 500 MG tablet Commonly known as:  GLUCOPHAGE Take 500 mg by mouth 2 (two) times daily with a meal.   metoprolol succinate 100 MG 24 hr tablet Commonly known as:  TOPROL-XL Take 100 mg by mouth 2 (two) times daily. Take with or immediately following a meal.   MUSCLE RUB 10-15 % Crea Apply 1 application topically 2 (two) times daily as needed.   nitroGLYCERIN 0.4 MG SL tablet Commonly known as:  NITROSTAT Place 0.4 mg under the tongue every 5 (five) minutes as needed for chest pain.   olmesartan-hydrochlorothiazide 40-25 MG tablet Commonly known as:  BENICAR HCT Take 1 tablet by mouth daily.   potassium chloride 8 MEQ tablet Commonly known  as:  KLOR-CON Take 8 mEq by mouth 2 (two) times daily. Give with food. Do not crush   rosuvastatin 5 MG tablet Commonly known as:  CRESTOR Take 5 mg by mouth once a week. On sunday   senna-docusate 8.6-50 MG tablet Commonly known as:  Senokot-S Take 1 tablet by mouth 2 (two) times daily as needed for constipation.   traMADol 50 MG tablet Commonly known as:  ULTRAM Take 2 tablets (100 mg total) by mouth every 6 (six) hours as needed for moderate pain.   Vitamin D3 2000 units capsule Take 2,000 Units by mouth daily.  Review of Systems  Constitutional: Negative for activity change, appetite change, chills, diaphoresis and fever.  HENT: Negative for congestion, mouth sores, nosebleeds, postnasal drip, sneezing, sore throat, trouble swallowing and voice change.   Respiratory: Negative for apnea, cough, choking, chest tightness, shortness of breath and wheezing.   Cardiovascular: Negative for chest pain, palpitations and leg swelling.  Gastrointestinal: Negative for abdominal distention, abdominal pain, constipation, diarrhea and nausea.  Genitourinary: Negative for difficulty urinating, dysuria, frequency and urgency.  Musculoskeletal: Positive for back pain. Negative for gait problem and myalgias. Arthralgias: typical arthritis.  Skin: Negative for color change, pallor, rash and wound.  Neurological: Positive for weakness. Negative for dizziness, tremors, syncope, speech difficulty, numbness and headaches.  Psychiatric/Behavioral: Positive for dysphoric mood. Negative for agitation and behavioral problems.  All other systems reviewed and are negative.   Immunization History  Administered Date(s) Administered  . Influenza-Unspecified 08/13/2015, 08/12/2016, 08/03/2017   Pertinent  Health Maintenance Due  Topic Date Due  . FOOT EXAM  08/28/1940  . OPHTHALMOLOGY EXAM  08/28/1940  . DEXA SCAN  08/28/1997  . PNA vac Low Risk Adult (1 of 2 - PCV13) 08/09/2018 (Originally  08/28/1997)  . HEMOGLOBIN A1C  05/16/2018  . INFLUENZA VACCINE  06/09/2018   No flowsheet data found. Functional Status Survey:    Vitals:   03/03/18 1716  BP: (!) 180/86  Pulse: 86  Resp: 19  Temp: 98.5 F (36.9 C)  SpO2: 98%   There is no height or weight on file to calculate BMI. Physical Exam  Constitutional: She is oriented to person, place, and time. Vital signs are normal. She appears well-developed and well-nourished. She is active and cooperative. She does not appear ill. No distress.  HENT:  Head: Normocephalic and atraumatic.  Mouth/Throat: Uvula is midline, oropharynx is clear and moist and mucous membranes are normal. Mucous membranes are not pale, not dry and not cyanotic.  Eyes: Pupils are equal, round, and reactive to light. Conjunctivae, EOM and lids are normal.  Neck: Trachea normal, normal range of motion and full passive range of motion without pain. Neck supple. No JVD present. No tracheal deviation, no edema and no erythema present. No thyromegaly present.  Cardiovascular: Normal rate, regular rhythm, normal heart sounds, intact distal pulses and normal pulses. Exam reveals no gallop, no distant heart sounds and no friction rub.  No murmur heard. Pulses:      Dorsalis pedis pulses are 2+ on the right side, and 2+ on the left side.  No edema  Pulmonary/Chest: Effort normal and breath sounds normal. No accessory muscle usage. No respiratory distress. She has no decreased breath sounds. She has no wheezes. She has no rhonchi. She has no rales. She exhibits no tenderness.  Abdominal: Soft. Normal appearance and bowel sounds are normal. She exhibits no distension and no ascites. There is no tenderness.  Musculoskeletal: Normal range of motion. She exhibits no edema or tenderness.  Expected osteoarthritis, stiffness; Bilateral Calves soft, supple. Negative Homan's Sign. B- pedal pulses equal; generalized weakness; mobile in wheelchair  Neurological: She is alert and  oriented to person, place, and time. She has normal strength. She displays atrophy. A sensory deficit is present. She exhibits abnormal muscle tone. Coordination and gait abnormal.  Skin: Skin is warm, dry and intact. She is not diaphoretic. No cyanosis. No pallor. Nails show no clubbing.  Psychiatric: Judgment and thought content normal. Her affect is blunt. Her speech is delayed. She is slowed. Cognition and memory are impaired.  Nursing note  and vitals reviewed.   Labs reviewed: Recent Labs    11/16/17 0530  NA 139  K 3.6  CL 100*  CO2 30  GLUCOSE 119*  BUN 34*  CREATININE 0.99  CALCIUM 9.7  MG 2.0   Recent Labs    11/16/17 0530  AST 22  ALT 18  ALKPHOS 55  BILITOT 0.6  PROT 7.5  ALBUMIN 4.2   Recent Labs    11/16/17 0530  WBC 5.8  NEUTROABS 2.8  HGB 13.5  HCT 40.4  MCV 95.1  PLT 160   Lab Results  Component Value Date   TSH 1.949 11/16/2017   Lab Results  Component Value Date   HGBA1C 8.3 (H) 11/16/2017   Lab Results  Component Value Date   CHOL 234 (H) 11/16/2017   HDL 45 11/16/2017   LDLCALC 144 (H) 11/16/2017   TRIG 223 (H) 11/16/2017   CHOLHDL 5.2 11/16/2017    Significant Diagnostic Results in last 30 days:  No results found.  Assessment/Plan Jody Dorsey was seen today for medical management of chronic issues.  Diagnoses and all orders for this visit:  Type 2 diabetes mellitus with stage 3 chronic kidney disease, with long-term current use of insulin (HCC)  Hypothyroidism, unspecified type  SDH (subdural hematoma) (HCC)   Above listed conditions stable, except DM  Continue current medication regimen, except  Increase Humalog 75/25 to 15 units with meals  Monitor for hypoglycemia  Continue to monitor FSBS AC/HS  OOB to chair daily  Assist with ADLs, meals as appropriate  Safety precautions  Fall precautions   Family/ staff Communication:   Total Time:  Documentation:  Face to Face:  Family/Phone:   Labs/tests  ordered:  Not due  Medication list reviewed and assessed for continued appropriateness. Monthly medication orders reviewed and signed.  Vikki Ports, NP-C [facility]iatric[facility] [phone], [place] [address] Cell Phone (Mon-Fri 8am-5pm):  [phone] On Call:  [phone] & follow prompts after 5pm & weekends Office Phone:  [phone] Office Fax:  [phone]

## 2018-03-25 ENCOUNTER — Emergency Department: Payer: Medicare Other

## 2018-03-25 ENCOUNTER — Emergency Department
Admission: EM | Admit: 2018-03-25 | Discharge: 2018-03-25 | Disposition: A | Discharge status: Home / Self Care | Payer: Medicare Other | Attending: Emergency Medicine | Admitting: Emergency Medicine

## 2018-03-25 ENCOUNTER — Encounter: Payer: Self-pay | Admitting: Emergency Medicine

## 2018-03-25 ENCOUNTER — Non-Acute Institutional Stay (SKILLED_NURSING_FACILITY): Payer: Medicare Other | Admitting: Gerontology

## 2018-03-25 ENCOUNTER — Encounter: Payer: Self-pay | Admitting: Gerontology

## 2018-03-25 DIAGNOSIS — E119 Type 2 diabetes mellitus without complications: Secondary | ICD-10-CM | POA: Insufficient documentation

## 2018-03-25 DIAGNOSIS — I2510 Atherosclerotic heart disease of native coronary artery without angina pectoris: Secondary | ICD-10-CM | POA: Insufficient documentation

## 2018-03-25 DIAGNOSIS — R404 Transient alteration of awareness: Secondary | ICD-10-CM

## 2018-03-25 DIAGNOSIS — Z79899 Other long term (current) drug therapy: Secondary | ICD-10-CM | POA: Insufficient documentation

## 2018-03-25 DIAGNOSIS — Z7982 Long term (current) use of aspirin: Secondary | ICD-10-CM | POA: Insufficient documentation

## 2018-03-25 DIAGNOSIS — R4182 Altered mental status, unspecified: Secondary | ICD-10-CM | POA: Insufficient documentation

## 2018-03-25 DIAGNOSIS — E039 Hypothyroidism, unspecified: Secondary | ICD-10-CM | POA: Insufficient documentation

## 2018-03-25 DIAGNOSIS — Z9861 Coronary angioplasty status: Secondary | ICD-10-CM | POA: Insufficient documentation

## 2018-03-25 DIAGNOSIS — I639 Cerebral infarction, unspecified: Secondary | ICD-10-CM | POA: Diagnosis not present

## 2018-03-25 DIAGNOSIS — R2990 Unspecified symptoms and signs involving the nervous system: Secondary | ICD-10-CM

## 2018-03-25 DIAGNOSIS — Z794 Long term (current) use of insulin: Secondary | ICD-10-CM | POA: Insufficient documentation

## 2018-03-25 DIAGNOSIS — I10 Essential (primary) hypertension: Secondary | ICD-10-CM | POA: Diagnosis not present

## 2018-03-25 LAB — CBC WITH DIFFERENTIAL/PLATELET
BASOS PCT: 1 %
Basophils Absolute: 0.1 10*3/uL (ref 0–0.1)
EOS ABS: 0.3 10*3/uL (ref 0–0.7)
Eosinophils Relative: 4 %
HEMATOCRIT: 36.6 % (ref 35.0–47.0)
HEMOGLOBIN: 12.2 g/dL (ref 12.0–16.0)
LYMPHS ABS: 1.2 10*3/uL (ref 1.0–3.6)
LYMPHS PCT: 15 %
MCH: 31.6 pg (ref 26.0–34.0)
MCHC: 33.3 g/dL (ref 32.0–36.0)
MCV: 94.7 fL (ref 80.0–100.0)
MONO ABS: 0.4 10*3/uL (ref 0.2–0.9)
MONOS PCT: 5 %
Neutro Abs: 6.1 10*3/uL (ref 1.4–6.5)
Neutrophils Relative %: 75 %
Platelets: 148 10*3/uL — ABNORMAL LOW (ref 150–440)
RBC: 3.86 MIL/uL (ref 3.80–5.20)
RDW: 13.9 % (ref 11.5–14.5)
WBC: 8 10*3/uL (ref 3.6–11.0)

## 2018-03-25 LAB — URINALYSIS, COMPLETE (UACMP) WITH MICROSCOPIC
BILIRUBIN URINE: NEGATIVE
Glucose, UA: NEGATIVE mg/dL
Hgb urine dipstick: NEGATIVE
KETONES UR: NEGATIVE mg/dL
Nitrite: NEGATIVE
PH: 5 (ref 5.0–8.0)
PROTEIN: 30 mg/dL — AB
Specific Gravity, Urine: 1.018 (ref 1.005–1.030)
Squamous Epithelial / LPF: NONE SEEN (ref 0–5)

## 2018-03-25 LAB — TROPONIN I

## 2018-03-25 LAB — BASIC METABOLIC PANEL
Anion gap: 10 (ref 5–15)
BUN: 37 mg/dL — ABNORMAL HIGH (ref 6–20)
CHLORIDE: 102 mmol/L (ref 101–111)
CO2: 24 mmol/L (ref 22–32)
CREATININE: 1.31 mg/dL — AB (ref 0.44–1.00)
Calcium: 9.1 mg/dL (ref 8.9–10.3)
GFR calc Af Amer: 42 mL/min — ABNORMAL LOW (ref >60)
GFR calc non Af Amer: 36 mL/min — ABNORMAL LOW (ref >60)
GLUCOSE: 153 mg/dL — AB (ref 65–99)
POTASSIUM: 3.9 mmol/L (ref 3.5–5.1)
Sodium: 136 mmol/L (ref 135–145)

## 2018-03-25 MED ORDER — SODIUM CHLORIDE 0.9 % IV BOLUS
500.0000 mL | Freq: Once | INTRAVENOUS | Status: AC
  Administered 2018-03-25: 500 mL via INTRAVENOUS

## 2018-03-25 NOTE — ED Provider Notes (Signed)
[facility] Emergency Department Provider Note ____________________________________________   First MD Initiated Contact with Patient 03/25/18 1114     (approximate)  I have reviewed the triage vital signs and the nursing notes.   HISTORY  Chief Complaint Altered Mental Status  Level 5 caveat: History of present illness limited due to dementia  HPI Jody Dorsey is a 84 y.o. female with PMH as noted below who presents after two episodes of apparent altered mental status and unresponsiveness this morning, once associated with a bowel movement.  During this time the patient was slumped over in bed and was not responding or following commands.  Her blood glucose was checked and was normal.  Per the daughter, the patient is now back to her baseline mental status.  She reports one similar prior episode several weeks ago which also resolved on its own.  The daughter states that when she last saw the patient yesterday afternoon she was at her baseline.  The patient has no complaints at this time.  Past Medical History:  Diagnosis Date  . CAD (coronary artery disease)    status post 2 stents (Dr. Claiborne Billings, Dr. Elisabeth Cara, Dr. Fath-cardiologist) 2008; MI, 10/2008  . Colonic adenoma    unspecified  . Diabetes mellitus type 2, uncomplicated ([place])   . DM (diabetes mellitus) ([place])   . Glaucoma   . HTN (hypertension)   . Microalbuminuria    diabetic  . Osteoarthritis   . Thyroid disease     Patient Active Problem List   Diagnosis Date Noted  . Hx of falling 03/08/2017  . Dysphagia, oropharyngeal phase 03/08/2017  . Peripheral vascular disease, unspecified ([place]) 03/08/2017  . Atherosclerotic heart disease of native coronary artery with unspecified angina pectoris ([place]) 03/08/2017  . Essential (primary) hypertension 03/08/2017  . Glaucoma 03/08/2017  . Hypothyroidism 03/08/2017  . Allergic rhinitis 03/08/2017  . Nutritional deficiency 03/08/2017  . Hyperlipidemia  03/08/2017  . Closed head injury with petechial brain hemorrhage ([place]) 11/30/2012  . SDH (subdural hematoma) ([place]) 11/30/2012  . Type 2 diabetes mellitus with stage 3 chronic kidney disease ([place]) 11/30/2012    Past Surgical History:  Procedure Laterality Date  . ABDOMINAL HYSTERECTOMY    . APPENDECTOMY  1949  . CATARACT EXTRACTION     left eye  . CERVICAL LAMINECTOMY  1994  . CESAREAN SECTION     x 3  . COLONOSCOPY W/ POLYPECTOMY  11/2007   (RTE) PCI/DES - Stents x 2 sequential- LAD 12/8/200  . CORONARY ANGIOPLASTY WITH STENT PLACEMENT     x 2  . ROTATOR CUFF REPAIR Left 1990    Prior to Admission medications   Medication Sig Start Date End Date Taking? Authorizing Provider  acetaminophen (TYLENOL) 325 MG tablet Take 650 mg by mouth every 6 (six) hours as needed for moderate pain. do not exceed 3grams in 24hr period   Yes [provider]  acetaminophen (TYLENOL) 650 MG CR tablet Take 650 mg by mouth every 12 (twelve) hours.    Yes [provider]  amLODipine (NORVASC) 10 MG tablet Take 10 mg by mouth daily.   Yes [provider]  aspirin 81 MG chewable tablet Chew 81 mg by mouth daily.   Yes [provider]  Cholecalciferol (VITAMIN D3) 2000 units capsule Take 2,000 Units by mouth daily.    Yes [provider]  cloNIDine (CATAPRES) 0.1 MG tablet Take 0.2 mg by mouth 2 (two) times daily.    Yes [provider]  dextrose (GLUTOSE) 40 % GEL Take 1 Tube by mouth as needed for low blood sugar. Give 1 tube every 30 minutes Until BS > 70 and pt is still responsive.   Yes [provider]  fluticasone (FLONASE) 50 MCG/ACT nasal spray Place 1 spray into both nostrils daily.    Yes [provider]  glucagon (GLUCAGON EMERGENCY) 1 MG injection Inject 1 mg into the muscle once as needed. For hypoglycemia (symptomatic) not responding to oral glucose or snack. May repeat x 1 after 15 mins. Use only if pt is becoming  unresponsive.   Yes [provider]  Infant Care Products (DERMACLOUD) CREA Apply 1 application topically as needed (for skin irritation). Apply liberal amount topically to area of skin irritation as needed. Ok to leave at bedside    Yes [provider]  insulin lispro protamine-lispro (HUMALOG 75/25 MIX) (75-25) 100 UNIT/ML SUSP injection Inject 15 Units into the skin 3 (three) times daily with meals.    Yes [provider]  isosorbide mononitrate (IMDUR) 30 MG 24 hr tablet Take 30 mg by mouth daily.   Yes [provider]  latanoprost (XALATAN) 0.005 % ophthalmic solution Place 1 drop into both eyes at bedtime.    Yes [provider]  levothyroxine (SYNTHROID, LEVOTHROID) 50 MCG tablet Take 50 mcg by mouth daily before breakfast.    Yes [provider]  Menthol-Methyl Salicylate (MUSCLE RUB) 10-15 % CREA Apply 1 application topically 2 (two) times daily as needed. Patient taking differently: Apply 1 application topically 2 (two) times daily as needed for muscle pain.  12/04/12  Yes Bynum Bellows, MD  metFORMIN (GLUCOPHAGE) 500 MG tablet Take 500 mg by mouth 2 (two) times daily with a meal.    Yes [provider]  metoprolol succinate (TOPROL-XL) 100 MG 24 hr tablet Take 100 mg by mouth 2 (two) times daily. Take with or immediately following a meal.    Yes [provider]  nitroGLYCERIN (NITROSTAT) 0.4 MG SL tablet Place 0.4 mg under the tongue every 5 (five) minutes as needed for chest pain.    Yes [provider]  olmesartan-hydrochlorothiazide (BENICAR HCT) 40-25 MG tablet Take 1 tablet by mouth daily.    Yes [provider]  Potassium Chloride CR (MICRO-K) 8 MEQ CPCR capsule CR Take 8 mEq by mouth 2 (two) times daily.   Yes [provider]  rosuvastatin (CRESTOR) 5 MG tablet Take 5 mg by mouth every Sunday. In the evening   Yes [provider]  senna-docusate (SENOKOT-S) 8.6-50 MG per tablet  Take 1 tablet by mouth 2 (two) times daily as needed for constipation. 12/04/12  Yes Bynum Bellows, MD  traMADol (ULTRAM) 50 MG tablet Take 2 tablets (100 mg total) by mouth every 6 (six) hours as needed for moderate pain. 12/14/16  Yes Theodoro Grist, MD    Allergies Amlodipine; Norvasc [amlodipine besylate]; and Celebrex [celecoxib]  No family history on file.  Social History Social History   Tobacco Use  . Smoking status: Never Smoker  . Smokeless tobacco: Never Used  Substance Use Topics  . Alcohol use: No  . Drug use: No    Review of Systems Level 5 caveat: Unable to obtain review of systems due to dementia    ____________________________________________   PHYSICAL EXAM:  VITAL SIGNS: ED Triage Vitals  Enc Vitals Group     BP 03/25/18 1112 126/67     Pulse Rate 03/25/18 1112 75  Resp 03/25/18 1112 18     Temp 03/25/18 1118 97.6 F (36.4 C)     Temp Source 03/25/18 1118 Oral     SpO2 03/25/18 1112 98 %     Weight 03/25/18 1113 154 lb (69.9 kg)     Height 03/25/18 1113 5\' 4"  (1.626 m)     Head Circumference --      Peak Flow --      Pain Score --      Pain Loc --      Pain Edu? --      Excl. in [place]? --     Constitutional: Alert, confused.  Comfortable appearing and in no acute distress. Eyes: Conjunctivae are normal.  EOMI.  PERRLA. Head: Atraumatic. Nose: No congestion/rhinnorhea. Mouth/Throat: Mucous membranes are dry.   Neck: Normal range of motion.  Cardiovascular: Normal rate, regular rhythm. Grossly normal heart sounds.  Good peripheral circulation. Respiratory: Normal respiratory effort.  No retractions. Lungs CTAB. Gastrointestinal: Soft and nontender. No distention.  Genitourinary: No flank tenderness. Musculoskeletal: No lower extremity edema.  Extremities warm and well perfused.  Neurologic:  Normal speech and langu motor and sensory intact in all extremities.  Cranial nerves III through XII intact.  Normal coordination.  No gross focal  neurologic deficits are appreciated.  Skin:  Skin is warm and dry. No rash noted. Psychiatric: Mood and affect are normal. Speech and behavior are normal.  ____________________________________________   LABS (all labs ordered are listed, but only abnormal results are displayed)  Labs Reviewed  BASIC METABOLIC PANEL - Abnormal; Notable for the following components:      Result Value   Glucose, Bld 153 (*)    BUN 37 (*)    Creatinine, Ser 1.31 (*)    GFR calc non Af Amer 36 (*)    GFR calc Af Amer 42 (*)    All other components within normal limits  CBC WITH DIFFERENTIAL/PLATELET - Abnormal; Notable for the following components:   Platelets 148 (*)    All other components within normal limits  URINALYSIS, COMPLETE (UACMP) WITH MICROSCOPIC - Abnormal; Notable for the following components:   Color, Urine YELLOW (*)    APPearance HAZY (*)    Protein, ur 30 (*)    Leukocytes, UA TRACE (*)    Bacteria, UA RARE (*)    All other components within normal limits  TROPONIN I   ____________________________________________  EKG  ED ECG REPORT I, Arta Silence, the attending physician, personally viewed and interpreted this ECG.  Date: 03/25/2018 EKG Time: 1107 Rate: 77 Rhythm: normal sinus rhythm QRS Axis: normal Intervals: RBBB, prolonged QT ST/T Wave abnormalities: LVH with secondary repolarization abnormality Narrative Interpretation: Nonspecific abnormality; no significant change when compared to EKG of 12/10/2016  ____________________________________________  RADIOLOGY  CT head: No ICH or other acute findings CXR: No focal infiltrate  ____________________________________________   PROCEDURES  Procedure(s) performed: No  Procedures  Critical Care performed: No ____________________________________________   INITIAL IMPRESSION / ASSESSMENT AND PLAN / ED COURSE  Pertinent labs & imaging results that were available during my care of the patient were reviewed  by me and considered in my medical decision making (see chart for details).  84 year old female with PMH as noted above including dementia and CAD presents after two episodes of altered mental status and decreased responsiveness, which have now resolved.  Per daughter, patient is back to her baseline at this time.  I reviewed the past medical records in Epic; the patient was most recently  admitted about 1 year ago for DKA with dehydration and hypokalemia.  On exam, the patient is relatively comfortable appearing, vital signs are normal, the neuro exam is nonfocal, and the only significant exam finding is somewhat dry mucous membranes.  Differential is broad and includes vasovagal syncope (possibly related to defecation), dehydration, other metabolic etiology, infection such as UTI, or less likely CNS or cardiac etiology.  Plan: CT head, infection work-up, basic and cardiac labs, fluid bolus, and reassess.    ----------------------------------------- 12:54 PM on 03/25/2018 -----------------------------------------  ED work-up has been negative.  CT head shows no acute findings, chest x-ray shows no evidence of pneumonia or other acute finding, UA is negative, and lab work-up is unremarkable.  Patient's creatinine is slightly elevated from prior but this by itself is likely not clinically significant and probably represents mild dehydration.  I suspect that the patient had a vasovagal episode possibly related to defecation.  On reassessment, the patient is still at her baseline mental status per daughter.  The family feels comfortable with the patient going back to her facility.  Return precautions given, and they expressed understanding.  ____________________________________________   FINAL CLINICAL IMPRESSION(S) / ED DIAGNOSES  Final diagnoses:  Altered mental status, unspecified altered mental status type      NEW MEDICATIONS STARTED DURING THIS VISIT:  New Prescriptions   No  medications on file     Note:  This document was prepared using Dragon voice recognition software and may include unintentional dictation errors.    Arta Silence, MD 03/25/18 1256

## 2018-03-25 NOTE — ED Notes (Signed)
Daughter at bedside.

## 2018-03-25 NOTE — Discharge Instructions (Addendum)
At this time, the work-up in the ER has not shown any significant findings to cause weakness, unresponsiveness, or change in mental status.    Jody Dorsey should return to the emergency department for new, worsening, recurrent change in mental status, weakness, low blood pressure, increased confusion, fevers, or any other new or worsening symptoms that concern you or facility staff.

## 2018-03-25 NOTE — ED Triage Notes (Signed)
Pt arrived from [facility] after two "episodes" of altered mental status that were noticed this morning (600 and 0900). Per facility pt slouched over in bed and was not following commands. Facility reports "they came to," and she was back to baseline. Blood sugars were checked during both episodes and were within normal range. Pt last seen by daughter yesterday at 10 and was at baseline per daughter. Pt does have dementia and is a poor historian.

## 2018-03-25 NOTE — Progress Notes (Signed)
Location:   [facility] Room Number: [phone] Place of Service:  SNF [phone]) Provider:  Toni Arthurs, NP-C  Kirk Ruths, MD  Patient Care Team: Kirk Ruths, MD as PCP - General (Internal Medicine) Jody Dorsey Javier Docker, MD as Consulting Physician (Cardiology)  Extended Emergency Contact Information Primary Emergency Contact: Drake,Karen S Address: [place], [place] [address] Johnnette Litter of [place] Phone: [phone] Work Phone: [phone] Relation: Daughter Secondary Emergency Contact: [place] of [place] Phone: [phone] Relation: Grandson  Code Status:  DNR Goals of care: Advanced Directive information Advanced Directives 03/25/2018  Does Patient Have a Medical Advance Directive? Yes  Type of Advance Directive Out of facility DNR (pink MOST or yellow form);Healthcare Power of Attorney  Does patient want to make changes to medical advance directive? No - Patient declined  Copy of [place] in Chart? Yes  Pre-existing out of facility DNR order (yellow form or pink MOST form) Yellow form placed in chart (order not valid for inpatient use)     Chief Complaint  Patient presents with  . Acute Visit    Possible stroke    HPI:  Pt is a 84 y.o. female seen today for an acute visit for altered mental status. Was called urgently to the room by nursing. Pt was sitting at the table eating breakfast and began to lean to the left. She had a left facial droop and was drooling. Pt was unable to answer simple questions or follow simple commands. Pt had an incontinent episode of stool. Pt was pale, slightly diaphoretic. She was unable to verbally identify her daughter. Pt lacked the blink reflex of B- eyes. Pupils were unresponsive to light changes. Pt had weakened hand grasp. VSS, BGs stable. Daughter initially declined evaluation in the ED.  After discussion with daughter, given the  options of either go the ED for evaluation/ definitive answer of +/- CVA vs pt stay at facility and we would get labs and send urine sample, etc. I assured daughter I would support her decision either way. Daughter decided to send pt to the ED to r/o CVA. Evaluation took place at 10:00. Pt was last witnessed in normal state/ baseline at ~8:00 this am. Informed nursing to send to ED.     Past Medical History:  Diagnosis Date  . CAD (coronary artery disease)    status post 2 stents (Dr. Claiborne Billings, Dr. Elisabeth Cara, Dr. Fath-cardiologist) 2008; MI, 10/2008  . Colonic adenoma    unspecified  . Diabetes mellitus type 2, uncomplicated ([name])   . DM (diabetes mellitus) ([place])   . Glaucoma   . HTN (hypertension)   . Microalbuminuria    diabetic  . Osteoarthritis   . Thyroid disease    Past Surgical History:  Procedure Laterality Date  . ABDOMINAL HYSTERECTOMY    . APPENDECTOMY  1949  . CATARACT EXTRACTION     left eye  . CERVICAL LAMINECTOMY  1994  . CESAREAN SECTION     x 3  . COLONOSCOPY W/ POLYPECTOMY  11/2007   (RTE) PCI/DES - Stents x 2 sequential- LAD 12/8/200  . CORONARY ANGIOPLASTY WITH STENT PLACEMENT     x 2  . ROTATOR CUFF REPAIR Left 1990    Allergies  Allergen Reactions  . Ace Inhibitors     Other reaction(s): Unknown  . Amlodipine     Other reaction(s): Unknown  . Norvasc [  Amlodipine Besylate]     Generic Norvasc-->cough  . Simvastatin     Other reaction(s): Unknown  . Celebrex [Celecoxib] Rash    Hives, itching      Allergies as of 03/25/2018      Reactions   Ace Inhibitors    Other reaction(s): Unknown   Amlodipine    Other reaction(s): Unknown   Norvasc [amlodipine Besylate]    Generic Norvasc-->cough   Simvastatin    Other reaction(s): Unknown   Celebrex [celecoxib] Rash   Hives, itching       Medication List        Accurate as of 03/25/18 10:27 AM. Always use your most recent med list.          acetaminophen 650 MG CR tablet Commonly known as:   TYLENOL Take 650 mg by mouth every 12 (twelve) hours as needed for pain.   acetaminophen 325 MG tablet Commonly known as:  TYLENOL Take 650 mg by mouth every 6 (six) hours as needed for moderate pain. do not exceed 3grams in 24hr period   amLODipine 10 MG tablet Commonly known as:  NORVASC Take 10 mg by mouth daily.   aspirin 81 MG chewable tablet Chew 81 mg by mouth daily.   cloNIDine 0.1 MG tablet Commonly known as:  CATAPRES Take 0.2 mg by mouth 2 (two) times daily. 2 tablets - Note Please let [facility] know when med order is due to be re-newed from pharmacy- new dose to be called in.)   DERMACLOUD Crea Apply liberal amount topically to area of skin irritation as needed. Ok to leave at bedside   dextrose 40 % Gel Commonly known as:  GLUTOSE Take 1 Tube by mouth as needed for low blood sugar. Give 1 tube every 30 minutes Until BS > 70 and pt is still responsive.   fluticasone 50 MCG/ACT nasal spray Commonly known as:  FLONASE Place 1 spray into both nostrils daily.   GLUCAGON EMERGENCY 1 MG injection Generic drug:  glucagon Inject 1 mg into the muscle once as needed. For hypoglycemia (symptomatic) not responding to oral glucose or snack. May repeat x 1 after 15 mins. Use only if pt is becoming unresponsive.   insulin lispro protamine-lispro (75-25) 100 UNIT/ML Susp injection Commonly known as:  HUMALOG 75/25 MIX Inject 15 Units into the skin 3 (three) times daily with meals.   isosorbide mononitrate 30 MG 24 hr tablet Commonly known as:  IMDUR TAKE 1 TABLET DAILY   latanoprost 0.005 % ophthalmic solution Commonly known as:  XALATAN Place 1 drop into both eyes at bedtime.   levothyroxine 50 MCG tablet Commonly known as:  SYNTHROID, LEVOTHROID Take 50 mcg by mouth daily before breakfast.   metFORMIN 500 MG tablet Commonly known as:  GLUCOPHAGE Take 500 mg by mouth 2 (two) times daily with a meal.   metoprolol succinate 100 MG 24 hr tablet Commonly known as:   TOPROL-XL Take 100 mg by mouth 2 (two) times daily. Take with or immediately following a meal.   MUSCLE RUB 10-15 % Crea Apply 1 application topically 2 (two) times daily as needed.   nitroGLYCERIN 0.4 MG SL tablet Commonly known as:  NITROSTAT Place 0.4 mg under the tongue every 5 (five) minutes as needed for chest pain.   olmesartan-hydrochlorothiazide 40-25 MG tablet Commonly known as:  BENICAR HCT Take 1 tablet by mouth daily.   potassium chloride 8 MEQ tablet Commonly known as:  KLOR-CON Take 8 mEq by mouth 2 (two) times  daily. Give with food. Do not crush   rosuvastatin 5 MG tablet Commonly known as:  CRESTOR Take 5 mg by mouth once a week. On sunday   senna-docusate 8.6-50 MG tablet Commonly known as:  Senokot-S Take 1 tablet by mouth 2 (two) times daily as needed for constipation.   traMADol 50 MG tablet Commonly known as:  ULTRAM Take 2 tablets (100 mg total) by mouth every 6 (six) hours as needed for moderate pain.   Vitamin D3 2000 units capsule Take 2,000 Units by mouth daily.       Review of Systems  Unable to perform ROS: Mental status change  Constitutional: Positive for activity change, diaphoresis and fatigue. Negative for appetite change, chills and fever.  HENT: Positive for drooling. Negative for congestion, mouth sores, nosebleeds, postnasal drip, sneezing, sore throat, trouble swallowing and voice change.   Respiratory: Negative for apnea, cough, choking, chest tightness, shortness of breath and wheezing.   Cardiovascular: Negative for chest pain, palpitations and leg swelling.  Gastrointestinal: Negative for abdominal distention, abdominal pain, constipation, diarrhea and nausea.  Genitourinary: Negative for difficulty urinating, dysuria, frequency and urgency.  Musculoskeletal: Positive for gait problem. Negative for back pain and myalgias. Arthralgias: typical arthritis.  Skin: Positive for pallor. Negative for color change, rash and wound.    Neurological: Positive for syncope, facial asymmetry, speech difficulty and weakness. Negative for dizziness, tremors, numbness and headaches.  Psychiatric/Behavioral: Positive for confusion. Negative for agitation and behavioral problems.  All other systems reviewed and are negative.   Immunization History  Administered Date(s) Administered  . Influenza-Unspecified 08/13/2015, 08/12/2016, 08/03/2017   Pertinent  Health Maintenance Due  Topic Date Due  . FOOT EXAM  08/28/1940  . OPHTHALMOLOGY EXAM  08/28/1940  . DEXA SCAN  08/28/1997  . PNA vac Low Risk Adult (1 of 2 - PCV13) 08/09/2018 (Originally 08/28/1997)  . HEMOGLOBIN A1C  05/16/2018  . INFLUENZA VACCINE  06/09/2018   No flowsheet data found. Functional Status Survey:    Vitals:   03/25/18 1021  BP: 138/86  Pulse: 78  Resp: 16  Temp: 98 F (36.7 C)  TempSrc: Oral  SpO2: 95%  Weight: 154 lb 11.2 oz (70.2 kg)  Height: 5\' 4"  (1.626 m)   Body mass index is 26.55 kg/m. Physical Exam  Constitutional: Vital signs are normal. She appears well-developed and well-nourished. She appears lethargic. She is active. She appears ill. No distress.  HENT:  Head: Normocephalic and atraumatic.  Mouth/Throat: Uvula is midline, oropharynx is clear and moist and mucous membranes are normal. Mucous membranes are not pale, not dry and not cyanotic.  Left side mouth droop  Eyes: Conjunctivae, EOM and lids are normal. Right pupil is not reactive. Left pupil is not reactive.  Neck: Trachea normal, normal range of motion and full passive range of motion without pain. Neck supple. No JVD present. No tracheal deviation, no edema and no erythema present. No thyromegaly present.  Cardiovascular: Normal rate, normal heart sounds, intact distal pulses and normal pulses. An irregular rhythm present. Exam reveals no gallop, no distant heart sounds and no friction rub.  No murmur heard. Pulses:      Dorsalis pedis pulses are 2+ on the right side,  and 2+ on the left side.  No edema  Pulmonary/Chest: Effort normal and breath sounds normal. No accessory muscle usage. No respiratory distress. She has no decreased breath sounds. She has no wheezes. She has no rhonchi. She has no rales. She exhibits no tenderness.  Abdominal:  Soft. Normal appearance and bowel sounds are normal. She exhibits no distension and no ascites. There is no tenderness.  Musculoskeletal: Normal range of motion. She exhibits no edema or tenderness.  Expected osteoarthritis, stiffness; Bilateral Calves soft, supple. Negative Homan's Sign. B- pedal pulses equal; increased weakness- weak hand grips, unable to stand  Neurological: She appears lethargic. A cranial nerve deficit is present. She exhibits abnormal muscle tone. Coordination abnormal.  Left facial droop  Skin: Skin is warm and intact. She is diaphoretic. No cyanosis. No pallor. Nails show no clubbing.  Psychiatric: Judgment and thought content normal. Her affect is blunt. Her speech is slurred. She is slowed. Cognition and memory are impaired.  Nursing note and vitals reviewed.   Labs reviewed: Recent Labs    11/16/17 0530  NA 139  K 3.6  CL 100*  CO2 30  GLUCOSE 119*  BUN 34*  CREATININE 0.99  CALCIUM 9.7  MG 2.0   Recent Labs    11/16/17 0530  AST 22  ALT 18  ALKPHOS 55  BILITOT 0.6  PROT 7.5  ALBUMIN 4.2   Recent Labs    11/16/17 0530  WBC 5.8  NEUTROABS 2.8  HGB 13.5  HCT 40.4  MCV 95.1  PLT 160   Lab Results  Component Value Date   TSH 1.949 11/16/2017   Lab Results  Component Value Date   HGBA1C 8.3 (H) 11/16/2017   Lab Results  Component Value Date   CHOL 234 (H) 11/16/2017   HDL 45 11/16/2017   LDLCALC 144 (H) 11/16/2017   TRIG 223 (H) 11/16/2017   CHOLHDL 5.2 11/16/2017    Significant Diagnostic Results in last 30 days:  No results found.  Assessment/Plan  Stroke-like episode (HCC)  Transient alteration of awareness   Send to ED to eval and treat, r/o  CVA  Family/ staff Communication:   Total Time: 35 minutes  Documentation:  Face to Face: 20 minutes   Family/Phone: 15 minutes   Labs/tests ordered:    Medication list reviewed and assessed for continued appropriateness.  Vikki Ports, NP-C Geriatrics [facility] [phone], [place] [address] Cell Phone (Mon-Fri 8am-5pm):  [phone] On Call:  [phone] & follow prompts after 5pm & weekends Office Phone:  [phone] Office Fax:  [phone]

## 2018-03-31 ENCOUNTER — Encounter: Payer: Self-pay | Admitting: Gerontology

## 2018-03-31 ENCOUNTER — Non-Acute Institutional Stay (SKILLED_NURSING_FACILITY): Payer: Medicare Other | Admitting: Gerontology

## 2018-03-31 DIAGNOSIS — E639 Nutritional deficiency, unspecified: Secondary | ICD-10-CM | POA: Diagnosis not present

## 2018-03-31 DIAGNOSIS — I639 Cerebral infarction, unspecified: Secondary | ICD-10-CM | POA: Diagnosis not present

## 2018-03-31 DIAGNOSIS — R2990 Unspecified symptoms and signs involving the nervous system: Secondary | ICD-10-CM

## 2018-03-31 DIAGNOSIS — I10 Essential (primary) hypertension: Secondary | ICD-10-CM | POA: Diagnosis not present

## 2018-03-31 DIAGNOSIS — E785 Hyperlipidemia, unspecified: Secondary | ICD-10-CM

## 2018-04-04 DIAGNOSIS — R2990 Unspecified symptoms and signs involving the nervous system: Secondary | ICD-10-CM | POA: Insufficient documentation

## 2018-04-04 DIAGNOSIS — I639 Cerebral infarction, unspecified: Secondary | ICD-10-CM

## 2018-04-04 NOTE — Assessment & Plan Note (Signed)
Stable/ Resolved. No further symptoms. Pt has returned to baseline, no residuals.

## 2018-04-04 NOTE — Progress Notes (Signed)
Location:    [facility]ursing Home Room Number: 253G Place of Service:  SNF (31) Provider:  Toni Arthurs, NP-C  Kirk Ruths, MD  Patient Care Team: Kirk Ruths, MD as PCP - General (Internal Medicine) Ubaldo Glassing Javier Docker, MD as Consulting Physician (Cardiology)  Extended Emergency Contact Information Primary Emergency Contact: Drake,Karen S Address: [place], [place] [address] [address] of [place] Phone: [phone] Work Phone: [phone] Relation: Daughter Secondary Emergency Contact: [place] Phone: [phone] Relation: Grandson  Code Status:  DNR Goals of care: Advanced Directive information Advanced Directives 03/31/2018  Does Patient Have a Medical Advance Directive? Yes  Type of Paramedic of [place];Out of facility DNR (pink MOST or yellow form);Living will  Does patient want to make changes to medical advance directive? No - Patient declined  Copy of [place] in Chart? Yes  Pre-existing out of facility DNR order (yellow form or pink MOST form) Yellow form placed in chart (order not valid for inpatient use)     Chief Complaint  Patient presents with  . Medical Management of Chronic Issues    Routine Visit    HPI:  Pt is a 84 y.o. female seen today for medical management of chronic diseases.    Nutritional deficiency Resolved. Good PO intake. Continue to monitor for weight loss, weakness  Hyperlipidemia, unspecified hyperlipidemia type Stable. On Crestor 5 mg po Q week. No myalgias or increased/ new weakness.  Stroke-like episode ([place]) Stable/ Resolved. No further symptoms. Pt has returned to baseline, no residuals.   Essential (primary) hypertension Persistently elevated B/P. QT prolongation on recent EKG in the ED. Denies chest pain or shortness of breath. Already on Benicar-HCTZ 40-25 mg po Q Day, Torpol XL 100 mg po BID, Imdur 30 mg po Q  Day, Catapres 0.2 mg po BID, Norvasc 10 mg po Q Day  Please note pt with limited verbal/cognitive ability. Unable to obtain complete ROS. Some ROS info obtained from staff and documentation.    Past Medical History:  Diagnosis Date  . CAD (coronary artery disease)    status post 2 stents (Dr. Claiborne Billings, Dr. Elisabeth Cara, Dr. Fath-cardiologist) 2008; MI, 10/2008  . Colonic adenoma    unspecified  . Diabetes mellitus type 2, uncomplicated ([place])   . DM (diabetes mellitus) ([place])   . Glaucoma   . HTN (hypertension)   . Microalbuminuria    diabetic  . Osteoarthritis   . Thyroid disease    Past Surgical History:  Procedure Laterality Date  . ABDOMINAL HYSTERECTOMY    . APPENDECTOMY  1949  . CATARACT EXTRACTION     left eye  . CERVICAL LAMINECTOMY  1994  . CESAREAN SECTION     x 3  . COLONOSCOPY W/ POLYPECTOMY  11/2007   (RTE) PCI/DES - Stents x 2 sequential- LAD 12/8/200  . CORONARY ANGIOPLASTY WITH STENT PLACEMENT     x 2  . ROTATOR CUFF REPAIR Left 1990    Allergies  Allergen Reactions  . Ace Inhibitors     Other reaction(s): Unknown Noted by [facility]  . Amlodipine     Other reaction(s): Unknown  . Norvasc [Amlodipine Besylate]     Generic Norvasc-->cough  . Simvastatin     Other reaction(s): Unknown Noted by [facility]   . Celebrex [Celecoxib] Rash    Hives, itching      Allergies as of 03/31/2018  Reactions   Ace Inhibitors    Other reaction(s): Unknown Noted by [facility]   Amlodipine    Other reaction(s): Unknown   Norvasc [amlodipine Besylate]    Generic Norvasc-->cough   Simvastatin    Other reaction(s): Unknown Noted by [facility]    Celebrex [celecoxib] Rash   Hives, itching       Medication List        Accurate as of 03/31/18 11:59 PM. Always use your most recent med list.          acetaminophen 650 MG CR tablet Commonly known as:  TYLENOL Take 650 mg by mouth every 12 (twelve) hours.   acetaminophen 325 MG tablet Commonly known as:  TYLENOL Take 650 mg by  mouth every 6 (six) hours as needed for moderate pain. do not exceed 3grams in 24hr period   amLODipine 10 MG tablet Commonly known as:  NORVASC Take 10 mg by mouth daily.   aspirin 81 MG chewable tablet Chew 81 mg by mouth daily.   cloNIDine 0.1 MG tablet Commonly known as:  CATAPRES Take 0.2 mg by mouth 2 (two) times daily. (Please let Larene Beach know when med order is due to be re-newed from pharmacy- new dose to be called in.)   DERMACLOUD Crea Apply 1 application topically as needed (for skin irritation). Apply liberal amount topically to area of skin irritation as needed. Ok to leave at bedside   dextrose 40 % Gel Commonly known as:  GLUTOSE Take 1 Tube by mouth as needed for low blood sugar. Give 1 tube every 30 minutes Until BS > 70 and pt is still responsive.   fluticasone 50 MCG/ACT nasal spray Commonly known as:  FLONASE Place 1 spray into both nostrils daily.   GLUCAGON EMERGENCY 1 MG injection Generic drug:  glucagon Inject 1 mg into the muscle once as needed. For hypoglycemia (symptomatic) not responding to oral glucose or snack. May repeat x 1 after 15 mins. Use only if pt is becoming unresponsive.   insulin lispro protamine-lispro (75-25) 100 UNIT/ML Susp injection Commonly known as:  HUMALOG 75/25 MIX Inject 15 Units into the skin 3 (three) times daily with meals.   isosorbide mononitrate 30 MG 24 hr tablet Commonly known as:  IMDUR Take 30 mg by mouth daily.   latanoprost 0.005 % ophthalmic solution Commonly known as:  XALATAN Place 1 drop into both eyes at bedtime.   levothyroxine 50 MCG tablet Commonly known as:  SYNTHROID, LEVOTHROID Take 50 mcg by mouth daily before breakfast.   metFORMIN 500 MG tablet Commonly known as:  GLUCOPHAGE Take 500 mg by mouth 2 (two) times daily with a meal.   metoprolol succinate 100 MG 24 hr tablet Commonly known as:  TOPROL-XL Take 100 mg by mouth 2 (two) times daily. Take with or immediately following a meal.     MUSCLE RUB 10-15 % Crea Apply 1 application topically 2 (two) times daily as needed.   nitroGLYCERIN 0.4 MG SL tablet Commonly known as:  NITROSTAT Place 0.4 mg under the tongue every 5 (five) minutes as needed for chest pain.   olmesartan-hydrochlorothiazide 40-25 MG tablet Commonly known as:  BENICAR HCT Take 1 tablet by mouth daily.   Potassium Chloride CR 8 MEQ Cpcr capsule CR Commonly known as:  MICRO-K Take 8 mEq by mouth 2 (two) times daily.   rosuvastatin 5 MG tablet Commonly known as:  CRESTOR Take 5 mg by mouth every Sunday. In the evening   senna-docusate 8.6-50 MG tablet  Commonly known as:  Senokot-S Take 1 tablet by mouth 2 (two) times daily as needed for constipation.   traMADol 50 MG tablet Commonly known as:  ULTRAM Take 2 tablets (100 mg total) by mouth every 6 (six) hours as needed for moderate pain.   Vitamin D3 2000 units capsule Take 2,000 Units by mouth daily.       Review of Systems  Unable to perform ROS: Dementia  Constitutional: Negative for activity change, appetite change, chills, diaphoresis and fever.  HENT: Negative for congestion, mouth sores, nosebleeds, postnasal drip, sneezing, sore throat, trouble swallowing and voice change.   Respiratory: Negative for apnea, cough, choking, chest tightness, shortness of breath and wheezing.   Cardiovascular: Negative for chest pain, palpitations and leg swelling.  Gastrointestinal: Negative for abdominal distention, abdominal pain, constipation, diarrhea and nausea.  Genitourinary: Negative for difficulty urinating, dysuria, frequency and urgency.  Musculoskeletal: Positive for gait problem. Negative for back pain and myalgias. Arthralgias: typical arthritis.  Skin: Negative for color change, pallor, rash and wound.  Neurological: Positive for weakness. Negative for dizziness, tremors, syncope, speech difficulty, numbness and headaches.  Psychiatric/Behavioral: Negative for agitation and behavioral  problems.  All other systems reviewed and are negative.   Immunization History  Administered Date(s) Administered  . Influenza-Unspecified 08/13/2015, 08/12/2016, 08/03/2017   Pertinent  Health Maintenance Due  Topic Date Due  . FOOT EXAM  08/28/1940  . OPHTHALMOLOGY EXAM  08/28/1940  . DEXA SCAN  08/28/1997  . PNA vac Low Risk Adult (1 of 2 - PCV13) 08/09/2018 (Originally 08/28/1997)  . HEMOGLOBIN A1C  05/16/2018  . INFLUENZA VACCINE  06/09/2018   No flowsheet data found. Functional Status Survey:    Vitals:   03/31/18 1330  BP: (!) 199/83  Pulse: 78  Resp: 18  Temp: 98 F (36.7 C)  TempSrc: Oral  SpO2: 98%  Weight: 154 lb 11.2 oz (70.2 kg)  Height: 5\' 4"  (1.626 m)   Body mass index is 26.55 kg/m. Physical Exam  Constitutional: She is oriented to person, place, and time. Vital signs are normal. She appears well-developed and well-nourished. She is active and cooperative. She does not appear ill. No distress.  HENT:  Head: Normocephalic and atraumatic.  Mouth/Throat: Uvula is midline, oropharynx is clear and moist and mucous membranes are normal. Mucous membranes are not pale, not dry and not cyanotic.  Eyes: Pupils are equal, round, and reactive to light. Conjunctivae, EOM and lids are normal.  Neck: Trachea normal, normal range of motion and full passive range of motion without pain. Neck supple. No JVD present. No tracheal deviation, no edema and no erythema present. No thyromegaly present.  Cardiovascular: Normal rate, regular rhythm, normal heart sounds, intact distal pulses and normal pulses. Exam reveals no gallop, no distant heart sounds and no friction rub.  No murmur heard. Pulses:      Dorsalis pedis pulses are 2+ on the right side, and 2+ on the left side.  No edema  Pulmonary/Chest: Effort normal and breath sounds normal. No accessory muscle usage. No respiratory distress. She has no decreased breath sounds. She has no wheezes. She has no rhonchi. She has  no rales. She exhibits no tenderness.  Abdominal: Soft. Normal appearance and bowel sounds are normal. She exhibits no distension and no ascites. There is no tenderness.  Musculoskeletal: Normal range of motion. She exhibits no edema or tenderness.  Expected osteoarthritis, stiffness; Bilateral Calves soft, supple. Negative Homan's Sign. B- pedal pulses equal; generalized weakness, mobile in wheelchair  Neurological: She is alert and oriented to person, place, and time. She has normal strength.  Skin: Skin is warm, dry and intact. She is not diaphoretic. No cyanosis. No pallor. Nails show no clubbing.  Psychiatric: Judgment and thought content normal. Her affect is blunt. Her speech is delayed. She is slowed and withdrawn. Cognition and memory are impaired. She exhibits abnormal recent memory.  Nursing note and vitals reviewed.   Labs reviewed: Recent Labs    11/16/17 0530 03/25/18 1135  NA 139 136  K 3.6 3.9  CL 100* 102  CO2 30 24  GLUCOSE 119* 153*  BUN 34* 37*  CREATININE 0.99 1.31*  CALCIUM 9.7 9.1  MG 2.0  --    Recent Labs    11/16/17 0530  AST 22  ALT 18  ALKPHOS 55  BILITOT 0.6  PROT 7.5  ALBUMIN 4.2   Recent Labs    11/16/17 0530 03/25/18 1135  WBC 5.8 8.0  NEUTROABS 2.8 6.1  HGB 13.5 12.2  HCT 40.4 36.6  MCV 95.1 94.7  PLT 160 148*   Lab Results  Component Value Date   TSH 1.949 11/16/2017   Lab Results  Component Value Date   HGBA1C 8.3 (H) 11/16/2017   Lab Results  Component Value Date   CHOL 234 (H) 11/16/2017   HDL 45 11/16/2017   LDLCALC 144 (H) 11/16/2017   TRIG 223 (H) 11/16/2017   CHOLHDL 5.2 11/16/2017    Significant Diagnostic Results in last 30 days:  Ct Head Wo Contrast  Result Date: 03/25/2018 CLINICAL DATA:  Altered mental status. EXAM: CT HEAD WITHOUT CONTRAST TECHNIQUE: Contiguous axial images were obtained from the base of the skull through the vertex without intravenous contrast. COMPARISON:  CT scan of December 10, 2016.  FINDINGS: Brain: Mild diffuse cortical atrophy is noted. Mild chronic ischemic white matter disease is noted. No mass effect or midline shift is noted. Ventricular size is within normal limits. There is no evidence of mass lesion, hemorrhage or acute infarction. Vascular: No hyperdense vessel or unexpected calcification. Skull: Normal. Negative for fracture or focal lesion. Sinuses/Orbits: No acute finding. Other: None. IMPRESSION: Mild diffuse cortical atrophy. Mild chronic ischemic white matter disease. No acute intracranial abnormality seen. Electronically Signed   By: Marijo Conception, M.D.   On: 03/25/2018 12:07   Dg Chest Portable 1 View  Result Date: 03/25/2018 CLINICAL DATA:  Altered mental status. EXAM: PORTABLE CHEST 1 VIEW COMPARISON:  Radiograph of December 10, 2016. FINDINGS: The heart size and mediastinal contours are within normal limits. Both lungs are clear. No pneumothorax or pleural effusion is noted. The visualized skeletal structures are unremarkable. IMPRESSION: No acute cardiopulmonary abnormality seen. Electronically Signed   By: Marijo Conception, M.D.   On: 03/25/2018 11:45    Assessment/Plan Karmyn was seen today for medical management of chronic issues.  Diagnoses and all orders for this visit:  Nutritional deficiency  Hyperlipidemia, unspecified hyperlipidemia type  Stroke-like episode ([place])  Essential (primary) hypertension   Above listed conditions stable  Continue current medication regimen, except  Increase Clonidine 0.3 mg po BID  Continue to encourage participation in activities and interaction with other residents  Safety precautions  Fall precautions  Labs next month  Family/ staff Communication:   Total Time:  Documentation:  Face to Face:  Family/Phone:   Labs/tests ordered:  Not due. Recent labs reviewed- stable  Medication list reviewed and assessed for continued appropriateness. Monthly medication orders reviewed and  signed.  Larene Beach H.  Payton Mccallum, NP-C [facility] [phone], [place] [address] Cell Phone (Mon-Fri 8am-5pm):  [phone] On Call:  [phone] & follow prompts after 5pm & weekends Office Phone:  [phone] Office Fax:  [phone]

## 2018-04-05 NOTE — Assessment & Plan Note (Signed)
Resolved. Good PO intake. Continue to monitor for weight loss, weakness

## 2018-04-05 NOTE — Assessment & Plan Note (Signed)
Stable. On Crestor 5 mg po Q week. No myalgias or increased/ new weakness.

## 2018-04-05 NOTE — Assessment & Plan Note (Signed)
Persistently elevated B/P. QT prolongation on recent EKG in the ED. Denies chest pain or shortness of breath. Already on Benicar-HCTZ 40-25 mg po Q Day, Torpol XL 100 mg po BID, Imdur 30 mg po Q Day, Catapres 0.2 mg po BID, Norvasc 10 mg po Q Day

## 2018-04-09 ENCOUNTER — Encounter
Admission: RE | Admit: 2018-04-09 | Discharge: 2018-04-09 | Disposition: A | Discharge status: Home / Self Care | Payer: Medicare Other | Source: Ambulatory Visit | Attending: Internal Medicine | Admitting: Internal Medicine

## 2018-05-05 ENCOUNTER — Encounter: Payer: Self-pay | Admitting: Gerontology

## 2018-05-09 ENCOUNTER — Encounter
Admission: RE | Admit: 2018-05-09 | Discharge: 2018-05-09 | Disposition: A | Discharge status: Home / Self Care | Payer: Medicare Other | Source: Ambulatory Visit | Attending: Internal Medicine | Admitting: Internal Medicine

## 2018-05-16 ENCOUNTER — Non-Acute Institutional Stay (SKILLED_NURSING_FACILITY): Payer: Medicare Other | Admitting: Adult Health

## 2018-05-16 ENCOUNTER — Encounter: Payer: Self-pay | Admitting: Adult Health

## 2018-05-16 DIAGNOSIS — Z794 Long term (current) use of insulin: Secondary | ICD-10-CM

## 2018-05-16 DIAGNOSIS — I119 Hypertensive heart disease without heart failure: Secondary | ICD-10-CM | POA: Diagnosis not present

## 2018-05-16 DIAGNOSIS — R52 Pain, unspecified: Secondary | ICD-10-CM

## 2018-05-16 DIAGNOSIS — E876 Hypokalemia: Secondary | ICD-10-CM | POA: Diagnosis not present

## 2018-05-16 DIAGNOSIS — G8929 Other chronic pain: Secondary | ICD-10-CM

## 2018-05-16 DIAGNOSIS — E1122 Type 2 diabetes mellitus with diabetic chronic kidney disease: Secondary | ICD-10-CM | POA: Diagnosis not present

## 2018-05-16 DIAGNOSIS — E1121 Type 2 diabetes mellitus with diabetic nephropathy: Secondary | ICD-10-CM | POA: Diagnosis not present

## 2018-05-16 DIAGNOSIS — L97412 Non-pressure chronic ulcer of right heel and midfoot with fat layer exposed: Secondary | ICD-10-CM

## 2018-05-16 DIAGNOSIS — E785 Hyperlipidemia, unspecified: Secondary | ICD-10-CM

## 2018-05-16 DIAGNOSIS — E11621 Type 2 diabetes mellitus with foot ulcer: Secondary | ICD-10-CM | POA: Diagnosis not present

## 2018-05-16 DIAGNOSIS — H409 Unspecified glaucoma: Secondary | ICD-10-CM | POA: Diagnosis not present

## 2018-05-16 DIAGNOSIS — N183 Chronic kidney disease, stage 3 (moderate): Secondary | ICD-10-CM | POA: Diagnosis not present

## 2018-05-16 DIAGNOSIS — E034 Atrophy of thyroid (acquired): Secondary | ICD-10-CM

## 2018-05-16 DIAGNOSIS — E1169 Type 2 diabetes mellitus with other specified complication: Secondary | ICD-10-CM | POA: Diagnosis not present

## 2018-05-16 NOTE — Progress Notes (Signed)
Opened in error; Disregard.

## 2018-05-16 NOTE — Progress Notes (Signed)
Location:   [facility] Room Number: [phone] Place of Service:  SNF (31)   CODE STATUS: DNR  Allergies  Allergen Reactions  . Ace Inhibitors     Other reaction(s): Unknown Noted by [facility]  . Amlodipine     Other reaction(s): Unknown  . Norvasc [Amlodipine Besylate]     Generic Norvasc-->cough  . Simvastatin     Other reaction(s): Unknown Noted by [facility]   . Celebrex [Celecoxib] Rash    Hives, itching      Chief Complaint  Patient presents with  . Medical Management of Chronic Issues    Hypertension; hypothyroidism; diabetes    HPI:  She is a 84 year old long term resident of this facility being seen for the management of her chronic illnesses: hypertension; hypothyroidism; diabetes. She is unable to participate in the hpi or ros. She has a diabetic foot ulceration on the lateral side of her right foot. Per staff she has had this wound for the past 2 months. I have been asked to seen the wound for further treatment options. There are no reports of fever present; there are no indications of pain present. There is no foul odor and no drainage present.   Past Medical History:  Diagnosis Date  . CAD (coronary artery disease)    status post 2 stents (Dr. Claiborne Billings, Dr. Elisabeth Cara, Dr. Fath-cardiologist) 2008; MI, 10/2008  . Colonic adenoma    unspecified  . Diabetes mellitus type 2, uncomplicated ([place])   . DM (diabetes mellitus) ([place])   . Glaucoma   . HTN (hypertension)   . Microalbuminuria    diabetic  . Osteoarthritis   . Thyroid disease     Past Surgical History:  Procedure Laterality Date  . ABDOMINAL HYSTERECTOMY    . APPENDECTOMY  1949  . CATARACT EXTRACTION     left eye  . CERVICAL LAMINECTOMY  1994  . CESAREAN SECTION     x 3  . COLONOSCOPY W/ POLYPECTOMY  11/2007   (RTE) PCI/DES - Stents x 2 sequential- LAD 12/8/200  . CORONARY ANGIOPLASTY WITH STENT PLACEMENT     x 2  . ROTATOR CUFF REPAIR Left 1990    Social History    Socioeconomic History  . Marital status: Widowed    Spouse name: Not on file  . Number of children: 3  . Years of education: 29  . Highest education level: 11th grade  Occupational History  . Not on file  Social Needs  . Financial resource strain: Not on file  . Food insecurity:    Worry: Not on file    Inability: Not on file  . Transportation needs:    Medical: Not on file    Non-medical: Not on file  Tobacco Use  . Smoking status: Never Smoker  . Smokeless tobacco: Never Used  Substance and Sexual Activity  . Alcohol use: No  . Drug use: No  . Sexual activity: Never  Lifestyle  . Physical activity:    Days per week: Not on file    Minutes per session: Not on file  . Stress: Not on file  Relationships  . Social connections:    Talks on phone: Not on file    Gets together: Not on file    Attends religious service: Not on file    Active member of club or organization: Not on file    Attends meetings of clubs or organizations: Not on file    Relationship status: Not on  file  . Intimate partner violence:    Fear of current or ex partner: Not on file    Emotionally abused: Not on file    Physically abused: Not on file    Forced sexual activity: Not on file  Other Topics Concern  . Not on file  Social History Narrative   Lives alone. Widowed since 11/12/2011. Denies tobacco    PCP Dr. Kern Alberta Strawberry   Lives in [place]    Denies cigarette use    Denies alcohol use   DNR   Admitted to [facility] 12/14/2016               History reviewed. No pertinent family history.    VITAL SIGNS BP (!) 185/77   Pulse 76   Temp 98.4 F (36.9 C) (Oral)   Resp 18   Ht 5\' 4"  (1.626 m)   Wt 161 lb 14.4 oz (73.4 kg)   SpO2 97%   BMI 27.79 kg/m   Outpatient Encounter Medications as of 05/16/2018  Medication Sig  . acetaminophen (TYLENOL) 325 MG tablet Take 650 mg by mouth every 6 (six) hours as needed for moderate pain. do not exceed 3grams in  24hr period  . acetaminophen (TYLENOL) 650 MG CR tablet Take 650 mg by mouth every 12 (twelve) hours.   Marland Kitchen amLODipine (NORVASC) 10 MG tablet Take 10 mg by mouth daily.  Marland Kitchen aspirin 81 MG chewable tablet Chew 81 mg by mouth daily.  . Cholecalciferol (VITAMIN D3) 2000 units capsule Take 2,000 Units by mouth daily.   . cloNIDine (CATAPRES) 0.2 MG tablet Take 0.4 mg by mouth 2 (two) times daily. (Please let provider know when med order is due to be re-newed from pharmacy- new dose to be called in.)  . dextrose (GLUTOSE) 40 % GEL Take 1 Tube by mouth as needed for low blood sugar. Give 1 tube every 30 minutes Until BS > 70 and pt is still responsive.  . fluticasone (FLONASE) 50 MCG/ACT nasal spray Place 1 spray into both nostrils daily.   Marland Kitchen glucagon (GLUCAGON EMERGENCY) 1 MG injection Inject 1 mg into the muscle once as needed. For hypoglycemia (symptomatic) not responding to oral glucose or snack. May repeat x 1 after 15 mins. Use only if pt is becoming unresponsive.  . Infant Care Products (DERMACLOUD) CREA Apply 1 application topically as needed (for skin irritation). Apply liberal amount topically to area of skin irritation as needed. Ok to leave at bedside   . insulin lispro protamine-lispro (HUMALOG 75/25 MIX) (75-25) 100 UNIT/ML SUSP injection Inject 15 Units into the skin 3 (three) times daily with meals.   . isosorbide mononitrate (IMDUR) 30 MG 24 hr tablet Take 30 mg by mouth daily.  Marland Kitchen latanoprost (XALATAN) 0.005 % ophthalmic solution Place 1 drop into both eyes at bedtime.   Marland Kitchen levothyroxine (SYNTHROID, LEVOTHROID) 50 MCG tablet Take 50 mcg by mouth daily before breakfast.   . Menthol-Methyl Salicylate (MUSCLE RUB) 10-15 % CREA Apply 1 application topically 2 (two) times daily as needed.  . metFORMIN (GLUCOPHAGE) 500 MG tablet Take 500 mg by mouth 2 (two) times daily with a meal.   . metoprolol succinate (TOPROL-XL) 100 MG 24 hr tablet Take 100 mg by mouth 2 (two) times daily. Take with or  immediately following a meal.   . nitroGLYCERIN (NITROSTAT) 0.4 MG SL tablet Place 0.4 mg under the tongue every 5 (five) minutes as needed for chest pain.   Marland Kitchen olmesartan-hydrochlorothiazide (BENICAR HCT)  40-25 MG tablet Take 1 tablet by mouth daily.   . Potassium Chloride CR (MICRO-K) 8 MEQ CPCR capsule CR Take 8 mEq by mouth 2 (two) times daily.  . rosuvastatin (CRESTOR) 5 MG tablet Take 5 mg by mouth every Sunday. In the evening  . senna-docusate (SENOKOT-S) 8.6-50 MG per tablet Take 1 tablet by mouth 2 (two) times daily as needed for constipation.  . traMADol (ULTRAM) 50 MG tablet Take 2 tablets (100 mg total) by mouth every 6 (six) hours as needed for moderate pain.  . [DISCONTINUED] cloNIDine (CATAPRES) 0.1 MG tablet Take 0.2 mg by mouth 2 (two) times daily. (Please let provider know when med order is due to be re-newed from pharmacy- new dose to be called in.)   No facility-administered encounter medications on file as of 05/16/2018.      SIGNIFICANT DIAGNOSTIC EXAMS  LABS REVIEWED: TODAY:   11-16-17: hgb a1c 8.3 chol 234; ldl 144; trig  223; hdl 45 tsh 1.949 03-25-18: wbc 8.0; hgb 12.2; hct 36.6; mcv 94.7; plt 148; glucose 153; bun 37; creat 1.31; k+ 3.9; na++ 136; ca 9.1   Review of Systems  Unable to perform ROS: Other (unable to participate )   Physical Exam  Constitutional: She appears well-developed and well-nourished. No distress.  Neck: No thyromegaly present.  Cardiovascular: Normal rate, regular rhythm and normal heart sounds.  Unable to palpate pedal and post tib pulses   Pulmonary/Chest: Effort normal and breath sounds normal. No respiratory distress.  Abdominal: Soft. Bowel sounds are normal. She exhibits no distension. There is no tenderness.  Musculoskeletal: Normal range of motion. She exhibits edema.  1+ bilateral lower extremity edema   Lymphadenopathy:    She has no cervical adenopathy.  Neurological: She is alert.  Skin: Skin is warm and dry. She is not  diaphoretic.  Right outer great toe small wound with slough in wound bed   Psychiatric: She has a normal mood and affect.      ASSESSMENT/ PLAN:  TODAY:   1. Atherosclerotic heart disease of native coronary artery with unspecified angina pectoris: is status post stent placement: is stable will continue imdur 30 mg daily  asa 81 mg daily toprol xl 100 mg twice daily  has prn ntg  2.  Benign hypertensive heart disease without CHF: is stable b/p 148/80 (recheck); will continue norvasc 10 mg daily clonidine 0.4 mg twice daily toprol xl 100 mg twice daily benicar hct 40/35 mg daily   3. Peripheral vascular disease: is without change; will continue asa 81 mg daily has right foot ulceration: will get ABI bilaterally   4.  Hypothyroidism due to atrophy of thyroid: is stable tsh 1.949 will continue synthroid 50 mcg daily   5.  Type 2 diabetes mellitus with stage 3 chronic kidney disease with long term use of insulin: hgb a1c 8.3: without change: will continue metformin 500 mg twice daily humalob mix 75/25 15 units three times daily with meals  6. Dyslipidemia associated with type 2 diabetes mellitus: is stable ldl 144 trig 233; will continue crestor 5 m every Sunday  7.  Renal disease due to diabetes mellitus type 2: stable bun 37 creat 1.31  8. Hypokalemia: is stable k+ 3.9 will continue k+ 8 meq twice daily   9. Bilateral glaucoma: stable will continue xalatan to both eyes nightly   10. Chronic generalized pain: is being managed: will continue tylenol 650 mg twice daily and ultram 100 mg every 6 hours as needed  11.  Diabetic ulcer of right foot: is without change: will use santyl to ulceration; provolon boot will get ABI will monitor  Will check pre-albumin and hgb a1c    MD is aware of resident's narcotic use and is in agreement with current plan of care. We will attempt to wean resident as apropriate   Ok Edwards NP [facility]  Contact [phone] Monday through  Friday 8am- 5pm  After hours call [phone]

## 2018-05-22 DIAGNOSIS — E1169 Type 2 diabetes mellitus with other specified complication: Secondary | ICD-10-CM | POA: Insufficient documentation

## 2018-05-22 DIAGNOSIS — I119 Hypertensive heart disease without heart failure: Secondary | ICD-10-CM | POA: Insufficient documentation

## 2018-05-22 DIAGNOSIS — E785 Hyperlipidemia, unspecified: Secondary | ICD-10-CM

## 2018-05-22 DIAGNOSIS — E1121 Type 2 diabetes mellitus with diabetic nephropathy: Secondary | ICD-10-CM | POA: Insufficient documentation

## 2018-05-22 DIAGNOSIS — E11621 Type 2 diabetes mellitus with foot ulcer: Secondary | ICD-10-CM | POA: Insufficient documentation

## 2018-05-22 DIAGNOSIS — L97519 Non-pressure chronic ulcer of other part of right foot with unspecified severity: Secondary | ICD-10-CM

## 2018-05-22 DIAGNOSIS — G8929 Other chronic pain: Secondary | ICD-10-CM | POA: Insufficient documentation

## 2018-05-22 DIAGNOSIS — R52 Pain, unspecified: Secondary | ICD-10-CM

## 2018-05-23 ENCOUNTER — Other Ambulatory Visit: Payer: Self-pay

## 2018-05-23 MED ORDER — TRAMADOL HCL 50 MG PO TABS: 100.0000 mg | ORAL_TABLET | Freq: Four times a day (QID) | ORAL | 0 refills | Status: DC | PRN

## 2018-05-23 NOTE — Telephone Encounter (Signed)
Rx sent to [facility] phone : [phone] , fax : [phone]  

## 2018-05-26 ENCOUNTER — Non-Acute Institutional Stay (SKILLED_NURSING_FACILITY): Payer: Medicare Other | Admitting: Adult Health

## 2018-05-26 ENCOUNTER — Encounter: Payer: Self-pay | Admitting: Adult Health

## 2018-05-26 ENCOUNTER — Encounter (INDEPENDENT_AMBULATORY_CARE_PROVIDER_SITE_OTHER): Payer: Medicare Other | Admitting: Vascular Surgery

## 2018-05-26 DIAGNOSIS — I639 Cerebral infarction, unspecified: Secondary | ICD-10-CM | POA: Diagnosis not present

## 2018-05-26 DIAGNOSIS — R2990 Unspecified symptoms and signs involving the nervous system: Secondary | ICD-10-CM

## 2018-05-26 NOTE — Progress Notes (Signed)
Location:   [place] Room Number: [phone] Place of Service:  SNF (31)   CODE STATUS: DNR  Allergies  Allergen Reactions  . Ace Inhibitors     Other reaction(s): Unknown Noted by [facility]  . Amlodipine     Other reaction(s): Unknown  . Norvasc [Amlodipine Besylate]     Generic Norvasc-->cough  . Simvastatin     Other reaction(s): Unknown Noted by [facility]   . Celebrex [Celecoxib] Rash    Hives, itching      Chief Complaint  Patient presents with  . Acute Visit    Change in status    HPI:  She has had an unresponsive episode. Per the nursing staff she has done this in the past; was taken to the ED. No further interventions were done at that time. Her vital signs are stable; there are no signs of hypoxia. She was found sitting in the dining area. There are no reports of fevers present.    Past Medical History:  Diagnosis Date  . CAD (coronary artery disease)    status post 2 stents (Dr. Claiborne Billings, Dr. Elisabeth Cara, Dr. Fath-cardiologist) 2008; MI, 10/2008  . Colonic adenoma    unspecified  . Diabetes mellitus type 2, uncomplicated ([place])   . DM (diabetes mellitus) ([place])   . Glaucoma   . HTN (hypertension)   . Microalbuminuria    diabetic  . Osteoarthritis   . Thyroid disease     Past Surgical History:  Procedure Laterality Date  . ABDOMINAL HYSTERECTOMY    . APPENDECTOMY  1949  . CATARACT EXTRACTION     left eye  . CERVICAL LAMINECTOMY  1994  . CESAREAN SECTION     x 3  . COLONOSCOPY W/ POLYPECTOMY  11/2007   (RTE) PCI/DES - Stents x 2 sequential- LAD 12/8/200  . CORONARY ANGIOPLASTY WITH STENT PLACEMENT     x 2  . ROTATOR CUFF REPAIR Left 1990    Social History   Socioeconomic History  . Marital status: Widowed    Spouse name: Not on file  . Number of children: 3  . Years of education: 63  . Highest education level: 11th grade  Occupational History  . Not on file  Social Needs  . Financial resource strain: Not on file  . Food  insecurity:    Worry: Not on file    Inability: Not on file  . Transportation needs:    Medical: Not on file    Non-medical: Not on file  Tobacco Use  . Smoking status: Never Smoker  . Smokeless tobacco: Never Used  Substance and Sexual Activity  . Alcohol use: No  . Drug use: No  . Sexual activity: Never  Lifestyle  . Physical activity:    Days per week: Not on file    Minutes per session: Not on file  . Stress: Not on file  Relationships  . Social connections:    Talks on phone: Not on file    Gets together: Not on file    Attends religious service: Not on file    Active member of club or organization: Not on file    Attends meetings of clubs or organizations: Not on file    Relationship status: Not on file  . Intimate partner violence:    Fear of current or ex partner: Not on file    Emotionally abused: Not on file    Physically abused: Not on file    Forced sexual activity: Not  on file  Other Topics Concern  . Not on file  Social History Narrative   Lives alone. Widowed since 11/12/2011. Denies tobacco    PCP Dr. Kern Alberta [place]   Lives in [place]    Denies cigarette use    Denies alcohol use   DNR   Admitted to [facility] 12/14/2016               History reviewed. No pertinent family history.    VITAL SIGNS BP (!) 151/66   Pulse 67   Temp 98.1 F (36.7 C) (Oral)   Resp 18   Ht 5\' 4"  (4.742 m)   Wt 161 lb 14.4 oz (73.4 kg)   SpO2 97%   BMI 27.79 kg/m   Outpatient Encounter Medications as of 05/26/2018  Medication Sig  . acetaminophen (TYLENOL) 325 MG tablet Take 650 mg by mouth every 6 (six) hours as needed for moderate pain. do not exceed 3grams in 24hr period  . acetaminophen (TYLENOL) 650 MG CR tablet Take 650 mg by mouth every 12 (twelve) hours.   Marland Kitchen amLODipine (NORVASC) 10 MG tablet Take 10 mg by mouth daily.  Marland Kitchen aspirin 81 MG chewable tablet Chew 81 mg by mouth daily.  . Cholecalciferol (VITAMIN D3) 2000 units capsule  Take 2,000 Units by mouth daily.   . cloNIDine (CATAPRES) 0.2 MG tablet Take 0.4 mg by mouth 2 (two) times daily. (Please let provider know when med order is due to be re-newed from pharmacy- new dose to be called in.)  . collagenase (SANTYL) ointment Apply 1 application topically daily. Cleanse left lateral first toe with ns, skin prep periwound, apply santyl 1/4 inch nickel thick, cover with ns gauze and allevyn.  Marland Kitchen dextrose (GLUTOSE) 40 % GEL Take 1 Tube by mouth as needed for low blood sugar. Give 1 tube every 30 minutes Until BS > 70 and pt is still responsive.  . fluticasone (FLONASE) 50 MCG/ACT nasal spray Place 1 spray into both nostrils daily.   Marland Kitchen glucagon (GLUCAGON EMERGENCY) 1 MG injection Inject 1 mg into the muscle once as needed. For hypoglycemia (symptomatic) not responding to oral glucose or snack. May repeat x 1 after 15 mins. Use only if pt is becoming unresponsive.  . Infant Care Products (DERMACLOUD) CREA Apply 1 application topically as needed (for skin irritation). Apply liberal amount topically to area of skin irritation as needed. Ok to leave at bedside   . insulin lispro protamine-lispro (HUMALOG 75/25 MIX) (75-25) 100 UNIT/ML SUSP injection Inject 15 Units into the skin 3 (three) times daily with meals.   . isosorbide mononitrate (IMDUR) 30 MG 24 hr tablet Take 30 mg by mouth daily.  Marland Kitchen latanoprost (XALATAN) 0.005 % ophthalmic solution Place 1 drop into both eyes at bedtime.   Marland Kitchen levothyroxine (SYNTHROID, LEVOTHROID) 50 MCG tablet Take 50 mcg by mouth daily before breakfast.   . Menthol-Methyl Salicylate (MUSCLE RUB) 10-15 % CREA Apply 1 application topically 2 (two) times daily as needed.  . metFORMIN (GLUCOPHAGE) 500 MG tablet Take 500 mg by mouth 2 (two) times daily with a meal.   . metoprolol succinate (TOPROL-XL) 100 MG 24 hr tablet Take 100 mg by mouth 2 (two) times daily. Take with or immediately following a meal.   . nitroGLYCERIN (NITROSTAT) 0.4 MG SL tablet Place 0.4  mg under the tongue every 5 (five) minutes as needed for chest pain.   Marland Kitchen olmesartan-hydrochlorothiazide (BENICAR HCT) 40-25 MG tablet Take 1 tablet by mouth daily.   Marland Kitchen  Potassium Chloride CR (MICRO-K) 8 MEQ CPCR capsule CR Take 8 mEq by mouth 2 (two) times daily.  . rosuvastatin (CRESTOR) 5 MG tablet Take 5 mg by mouth every Sunday. In the evening  . senna-docusate (SENOKOT-S) 8.6-50 MG per tablet Take 1 tablet by mouth 2 (two) times daily as needed for constipation.  . traMADol (ULTRAM) 50 MG tablet Take 2 tablets (100 mg total) by mouth every 6 (six) hours as needed for moderate pain.  Marland Kitchen UNABLE TO FIND Diet Change: Please add moisture/gravy as a side on all meals. Diet Type: No Concentrated Sweets   No facility-administered encounter medications on file as of 05/26/2018.      SIGNIFICANT DIAGNOSTIC EXAMS   LABS REVIEWED: PREVIOUS:   11-16-17: hgb a1c 8.3 chol 234; ldl 144; trig  223; hdl 45 tsh 1.949 03-25-18: wbc 8.0; hgb 12.2; hct 36.6; mcv 94.7; plt 148; glucose 153; bun 37; creat 1.31; k+ 3.9; na++ 136; ca 9.1   NO NEW LABS.    Review of Systems  Unable to perform ROS: Patient unresponsive    Physical Exam  Constitutional: She appears well-developed and well-nourished. No distress.  Neck: No thyromegaly present.  Cardiovascular: Normal rate, regular rhythm and normal heart sounds.  Unable to palpate pedal or post tib pulses   Pulmonary/Chest: Effort normal and breath sounds normal. No respiratory distress.  Abdominal: Soft. Bowel sounds are normal. She exhibits no distension. There is no tenderness.  Musculoskeletal: She exhibits edema.  Is not moving any extremity at this time.  1+ bilateral lower extremity edema   Lymphadenopathy:    She has no cervical adenopathy.  Neurological:  Unresponsive   Skin: Skin is warm and dry. She is not diaphoretic.     ASSESSMENT/ PLAN:  TODAY:   1. Post like episode: is worse; will continue to monitor her status. Will continue asa  81 mg daily will not send her to the ED at this time.   MD is aware of resident's narcotic use and is in agreement with current plan of care. We will attempt to wean resident as apropriate   Ok Edwards NP [facility]  Contact [phone] Monday through Friday 8am- 5pm  After hours call [phone]

## 2018-06-06 ENCOUNTER — Encounter (INDEPENDENT_AMBULATORY_CARE_PROVIDER_SITE_OTHER): Payer: Self-pay

## 2018-06-06 ENCOUNTER — Encounter (INDEPENDENT_AMBULATORY_CARE_PROVIDER_SITE_OTHER): Payer: Self-pay | Admitting: Vascular Surgery

## 2018-06-06 ENCOUNTER — Ambulatory Visit (INDEPENDENT_AMBULATORY_CARE_PROVIDER_SITE_OTHER): Payer: Medicare Other | Admitting: Vascular Surgery

## 2018-06-06 VITALS — BP 159/81 | HR 80 | Resp 16 | Ht 64.0 in | Wt 161.0 lb

## 2018-06-06 DIAGNOSIS — E1122 Type 2 diabetes mellitus with diabetic chronic kidney disease: Secondary | ICD-10-CM

## 2018-06-06 DIAGNOSIS — E1169 Type 2 diabetes mellitus with other specified complication: Secondary | ICD-10-CM | POA: Diagnosis not present

## 2018-06-06 DIAGNOSIS — Z794 Long term (current) use of insulin: Secondary | ICD-10-CM

## 2018-06-06 DIAGNOSIS — N183 Chronic kidney disease, stage 3 (moderate): Secondary | ICD-10-CM

## 2018-06-06 DIAGNOSIS — E785 Hyperlipidemia, unspecified: Secondary | ICD-10-CM

## 2018-06-06 DIAGNOSIS — I739 Peripheral vascular disease, unspecified: Secondary | ICD-10-CM

## 2018-06-06 NOTE — Progress Notes (Signed)
Subjective:    Patient ID: Jody Dorsey, female    DOB: 08-09-1930, 85 y.o.   MRN: [id number] Chief Complaint  Patient presents with  . New Patient (Initial Visit)    ref Ouida Sills for left le ulcer   Presents as a new patient referred by Dr. Ouida Sills for evaluation of peripheral artery disease with ulcer formation to the left lower extremity.  The patient was seen with her daughter.  The patient is a resident of [facility] at [facility] at [place].  The history was obtained mostly from the patient's daughter.  The patient has had 2 ulcers one on the lateral aspect of her first big toe and one on her big toe for approximately 1 month.  These ulcerations have not shown any signs of healing.  The patient does not ambulate independently.  She does ambulate herself in her wheelchair.  The patient does note pain to the left lower extremity when she is trying to ambulate in her wheelchair.  Denies any rest pain.  The patient did undergo a bilateral ABI on May 17, 2018 which was notable for moderate arterial disease in the right lower extremity at the level of the posterior tibial artery and dorsalis pedis artery.  Severe arterial disease at the level of the left posterior tibial artery and left dorsalis pedis artery.  The patient's daughter states that she did have a stent placed to the left lower extremity "many years ago".  The patient denies any drainage from the ulcerations, surrounding erythema, necrotic tissue or foul odor.  Patient denies any erythema to the left lower extremity or foot.  Patient denies any fever, nausea vomiting.  Review of Systems  Constitutional: Negative.   HENT: Negative.   Eyes: Negative.   Respiratory: Negative.   Cardiovascular: Negative.   Gastrointestinal: Negative.   Endocrine: Negative.   Genitourinary: Negative.   Musculoskeletal: Negative.   Skin: Positive for wound.  Allergic/Immunologic: Negative.   Neurological: Negative.   Hematological:  Negative.   Psychiatric/Behavioral: Negative.       Objective:   Physical Exam  Constitutional: She is oriented to person, place, and time. She appears well-developed and well-nourished. No distress.  HENT:  Head: Normocephalic and atraumatic.  Right Ear: External ear normal.  Left Ear: External ear normal.  Eyes: Pupils are equal, round, and reactive to light. Conjunctivae and EOM are normal.  Neck: Normal range of motion.  Cardiovascular: Normal rate, regular rhythm, normal heart sounds and intact distal pulses.  Pulses:      Radial pulses are 2+ on the right side, and 2+ on the left side.  Hard to palpate pedal pulses bilaterally  Pulmonary/Chest: Effort normal and breath sounds normal.  Musculoskeletal: Normal range of motion. She exhibits edema (Mild to moderate 1+ pitting edema to the left lower extremity.  Mild nonpitting edema to the right lower extremity).  Neurological: She is alert and oriented to person, place, and time.  Skin: She is not diaphoretic.  Left foot:  Lateral aspect 3cm x 3cm shallow ulceration noted 100% fibrinous exudate to the wound bed.  There is no necrotic tissue or surrounding erythema.  No cellulitis. Ulcer the first big toe -minimal in size.  Shallow.  No necrotic tissue.  No cellulitis.   Psychiatric: She has a normal mood and affect. Her behavior is normal. Judgment and thought content normal.   BP (!) 159/81 (BP Location: Left Arm)   Pulse 80   Resp 16   Ht 5\' 4"  (  1.626 m)   Wt 161 lb (73 kg)   BMI 27.64 kg/m   Past Medical History:  Diagnosis Date  . CAD (coronary artery disease)    status post 2 stents (Dr. Claiborne Billings, Dr. Elisabeth Cara, Dr. Fath-cardiologist) 2008; MI, 10/2008  . Colonic adenoma    unspecified  . Diabetes mellitus type 2, uncomplicated ([place])   . DM (diabetes mellitus) ([place])   . Glaucoma   . HTN (hypertension)   . Microalbuminuria    diabetic  . Osteoarthritis   . Thyroid disease    Social History   Socioeconomic History   . Marital status: Widowed    Spouse name: Not on file  . Number of children: 3  . Years of education: 77  . Highest education level: 11th grade  Occupational History  . Not on file  Social Needs  . Financial resource strain: Not on file  . Food insecurity:    Worry: Not on file    Inability: Not on file  . Transportation needs:    Medical: Not on file    Non-medical: Not on file  Tobacco Use  . Smoking status: Never Smoker  . Smokeless tobacco: Never Used  Substance and Sexual Activity  . Alcohol use: No  . Drug use: No  . Sexual activity: Never  Lifestyle  . Physical activity:    Days per week: Not on file    Minutes per session: Not on file  . Stress: Not on file  Relationships  . Social connections:    Talks on phone: Not on file    Gets together: Not on file    Attends religious service: Not on file    Active member of club or organization: Not on file    Attends meetings of clubs or organizations: Not on file    Relationship status: Not on file  . Intimate partner violence:    Fear of current or ex partner: Not on file    Emotionally abused: Not on file    Physically abused: Not on file    Forced sexual activity: Not on file  Other Topics Concern  . Not on file  Social History Narrative   Lives alone. Widowed since 11/12/2011. Denies tobacco    PCP Dr. Kern Alberta [place]   Lives in [place]    Denies cigarette use    Denies alcohol use   DNR   Admitted to [facility] 12/14/2016               Past Surgical History:  Procedure Laterality Date  . ABDOMINAL HYSTERECTOMY    . APPENDECTOMY  1949  . CATARACT EXTRACTION     left eye  . CERVICAL LAMINECTOMY  1994  . CESAREAN SECTION     x 3  . COLONOSCOPY W/ POLYPECTOMY  11/2007   (RTE) PCI/DES - Stents x 2 sequential- LAD 12/8/200  . CORONARY ANGIOPLASTY WITH STENT PLACEMENT     x 2  . ROTATOR CUFF REPAIR Left 1990   Family History  Problem Relation Age of Onset  . Stroke  Mother    Allergies  Allergen Reactions  . Ace Inhibitors     Other reaction(s): Unknown Noted by [facility]  . Amlodipine     Other reaction(s): Unknown  . Norvasc [Amlodipine Besylate]     Generic Norvasc-->cough  . Simvastatin     Other reaction(s): Unknown Noted by [facility]   . Celebrex [Celecoxib] Rash    Hives, itching  Assessment & Plan:  Presents as a new patient referred by Dr. Ouida Sills for evaluation of peripheral artery disease with ulcer formation to the left lower extremity.  The patient was seen with her daughter.  The patient is a resident of [facility] at [facility] at [place].  The history was obtained mostly from the patient's daughter.  The patient has had 2 ulcers one on the lateral aspect of her first big toe and one on her big toe for approximately 1 month.  These ulcerations have not shown any signs of healing.  The patient does not ambulate independently.  She does ambulate herself in her wheelchair.  The patient does note pain to the left lower extremity when she is trying to ambulate in her wheelchair.  Denies any rest pain.  The patient did undergo a bilateral ABI on May 17, 2018 which was notable for moderate arterial disease in the right lower extremity at the level of the posterior tibial artery and dorsalis pedis artery.  Severe arterial disease at the level of the left posterior tibial artery and left dorsalis pedis artery.  The patient's daughter states that she did have a stent placed to the left lower extremity "many years ago".  The patient denies any drainage from the ulcerations, surrounding erythema, necrotic tissue or foul odor.  Patient denies any erythema to the left lower extremity or foot.  Patient denies any fever, nausea vomiting.  1. Peripheral vascular disease, unspecified ([place]) - New Patient with a past medical history of peripheral artery disease requiring endovascular intervention with a stent. The patient has not had any recent follow-up  or imaging of the stent. Patient with multiple risk factors for peripheral artery disease ABI conducted on May 17, 2018 with severe disease to the left lower extremity and moderate disease to the right lower extremity The patient has 2 ulcerations which are nonhealing to the left foot No palpable pedal pulses on exam Recommend a left lower extremity angiogram with possible intervention to assess the patient's anatomy and contributing degree of peripheral artery disease.  If appropriate an attempt to revascularize the leg can be made at that time Procedure, risks and benefits explained to the patient and her daughter All questions answered Patient and her daughter wish to proceed  2. Type 2 diabetes mellitus with stage 3 chronic kidney disease, with long-term current use of insulin (HCC) - Stable Encouraged good control as its slows the progression of atherosclerotic disease  3. Dyslipidemia associated with type 2 diabetes mellitus ([place]) - Stable Encouraged good control as its slows the progression of atherosclerotic disease  Current Outpatient Medications on File Prior to Visit  Medication Sig Dispense Refill  . acetaminophen (TYLENOL) 325 MG tablet Take 650 mg by mouth every 6 (six) hours as needed for moderate pain. do not exceed 3grams in 24hr period    . acetaminophen (TYLENOL) 650 MG CR tablet Take 650 mg by mouth every 12 (twelve) hours.     Marland Kitchen amLODipine (NORVASC) 10 MG tablet Take 10 mg by mouth daily.    Marland Kitchen aspirin 81 MG chewable tablet Chew 81 mg by mouth daily.    . Cholecalciferol (VITAMIN D3) 2000 units capsule Take 2,000 Units by mouth daily.     . cloNIDine (CATAPRES) 0.2 MG tablet Take 0.4 mg by mouth 2 (two) times daily. (Please let provider know when med order is due to be re-newed from pharmacy- new dose to be called in.)    . collagenase (SANTYL) ointment Apply  1 application topically daily. Cleanse left lateral first toe with ns, skin prep periwound, apply santyl 1/4 inch  nickel thick, cover with ns gauze and allevyn.    Marland Kitchen dextrose (GLUTOSE) 40 % GEL Take 1 Tube by mouth as needed for low blood sugar. Give 1 tube every 30 minutes Until BS > 70 and pt is still responsive.    . fluticasone (FLONASE) 50 MCG/ACT nasal spray Place 1 spray into both nostrils daily.     Marland Kitchen glucagon (GLUCAGON EMERGENCY) 1 MG injection Inject 1 mg into the muscle once as needed. For hypoglycemia (symptomatic) not responding to oral glucose or snack. May repeat x 1 after 15 mins. Use only if pt is becoming unresponsive.    . Infant Care Products (DERMACLOUD) CREA Apply 1 application topically as needed (for skin irritation). Apply liberal amount topically to area of skin irritation as needed. Ok to leave at bedside     . insulin lispro protamine-lispro (HUMALOG 75/25 MIX) (75-25) 100 UNIT/ML SUSP injection Inject 15 Units into the skin 3 (three) times daily with meals.     . isosorbide mononitrate (IMDUR) 30 MG 24 hr tablet Take 30 mg by mouth daily.    Marland Kitchen latanoprost (XALATAN) 0.005 % ophthalmic solution Place 1 drop into both eyes at bedtime.     Marland Kitchen levothyroxine (SYNTHROID, LEVOTHROID) 50 MCG tablet Take 50 mcg by mouth daily before breakfast.     . Menthol-Methyl Salicylate (MUSCLE RUB) 10-15 % CREA Apply 1 application topically 2 (two) times daily as needed. 1 Tube 0  . metFORMIN (GLUCOPHAGE) 500 MG tablet Take 500 mg by mouth 2 (two) times daily with a meal.     . metoprolol succinate (TOPROL-XL) 100 MG 24 hr tablet Take 100 mg by mouth 2 (two) times daily. Take with or immediately following a meal.     . nitroGLYCERIN (NITROSTAT) 0.4 MG SL tablet Place 0.4 mg under the tongue every 5 (five) minutes as needed for chest pain.     Marland Kitchen olmesartan-hydrochlorothiazide (BENICAR HCT) 40-25 MG tablet Take 1 tablet by mouth daily.     . Potassium Chloride CR (MICRO-K) 8 MEQ CPCR capsule CR Take 8 mEq by mouth 2 (two) times daily.    . rosuvastatin (CRESTOR) 5 MG tablet Take 5 mg by mouth every Sunday.  In the evening    . senna-docusate (SENOKOT-S) 8.6-50 MG per tablet Take 1 tablet by mouth 2 (two) times daily as needed for constipation. 60 tablet 0  . traMADol (ULTRAM) 50 MG tablet Take 2 tablets (100 mg total) by mouth every 6 (six) hours as needed for moderate pain. 120 tablet 0  . UNABLE TO FIND Diet Change: Please add moisture/gravy as a side on all meals. Diet Type: No Concentrated Sweets     No current facility-administered medications on file prior to visit.    There are no Patient Instructions on file for this visit. No follow-ups on file.  Thijs Brunton A Kasen Sako, PA-C

## 2018-06-09 ENCOUNTER — Encounter
Admission: RE | Admit: 2018-06-09 | Discharge: 2018-06-09 | Disposition: A | Discharge status: Home / Self Care | Payer: Medicare Other | Source: Ambulatory Visit | Attending: Internal Medicine | Admitting: Internal Medicine

## 2018-06-13 ENCOUNTER — Other Ambulatory Visit (INDEPENDENT_AMBULATORY_CARE_PROVIDER_SITE_OTHER): Payer: Self-pay | Admitting: Vascular Surgery

## 2018-06-13 ENCOUNTER — Other Ambulatory Visit: Payer: Self-pay

## 2018-06-13 MED ORDER — TRAMADOL HCL 50 MG PO TABS: 50.0000 mg | ORAL_TABLET | Freq: Four times a day (QID) | ORAL | 0 refills | Status: DC | PRN

## 2018-06-13 MED ORDER — CEFAZOLIN SODIUM-DEXTROSE 2-4 GM/100ML-% IV SOLN
2.0000 g | Freq: Once | INTRAVENOUS | Status: AC
  Administered 2018-06-14: 2 g via INTRAVENOUS

## 2018-06-13 NOTE — Telephone Encounter (Signed)
Rx sent to [facility] phone : [phone] , fax : [phone]  

## 2018-06-14 ENCOUNTER — Encounter
Admission: RE | Disposition: A | Discharge status: Skilled Nursing Facility | Payer: Self-pay | Source: Ambulatory Visit | Attending: Vascular Surgery

## 2018-06-14 ENCOUNTER — Other Ambulatory Visit: Payer: Self-pay

## 2018-06-14 ENCOUNTER — Observation Stay
Admission: RE | Admit: 2018-06-14 | Discharge: 2018-06-15 | Disposition: A | Discharge status: Skilled Nursing Facility | Payer: Medicare Other | Source: Ambulatory Visit | Attending: Vascular Surgery | Admitting: Vascular Surgery

## 2018-06-14 DIAGNOSIS — Z79890 Hormone replacement therapy (postmenopausal): Secondary | ICD-10-CM | POA: Diagnosis not present

## 2018-06-14 DIAGNOSIS — Z823 Family history of stroke: Secondary | ICD-10-CM | POA: Insufficient documentation

## 2018-06-14 DIAGNOSIS — E785 Hyperlipidemia, unspecified: Secondary | ICD-10-CM | POA: Insufficient documentation

## 2018-06-14 DIAGNOSIS — N183 Chronic kidney disease, stage 3 (moderate): Secondary | ICD-10-CM | POA: Diagnosis not present

## 2018-06-14 DIAGNOSIS — Z79899 Other long term (current) drug therapy: Secondary | ICD-10-CM | POA: Diagnosis not present

## 2018-06-14 DIAGNOSIS — Z9842 Cataract extraction status, left eye: Secondary | ICD-10-CM | POA: Diagnosis not present

## 2018-06-14 DIAGNOSIS — Z955 Presence of coronary angioplasty implant and graft: Secondary | ICD-10-CM | POA: Insufficient documentation

## 2018-06-14 DIAGNOSIS — Z7982 Long term (current) use of aspirin: Secondary | ICD-10-CM | POA: Insufficient documentation

## 2018-06-14 DIAGNOSIS — I2510 Atherosclerotic heart disease of native coronary artery without angina pectoris: Secondary | ICD-10-CM | POA: Diagnosis not present

## 2018-06-14 DIAGNOSIS — L97909 Non-pressure chronic ulcer of unspecified part of unspecified lower leg with unspecified severity: Secondary | ICD-10-CM

## 2018-06-14 DIAGNOSIS — E1122 Type 2 diabetes mellitus with diabetic chronic kidney disease: Secondary | ICD-10-CM | POA: Diagnosis not present

## 2018-06-14 DIAGNOSIS — Z888 Allergy status to other drugs, medicaments and biological substances status: Secondary | ICD-10-CM | POA: Diagnosis not present

## 2018-06-14 DIAGNOSIS — E039 Hypothyroidism, unspecified: Secondary | ICD-10-CM | POA: Diagnosis not present

## 2018-06-14 DIAGNOSIS — I70248 Atherosclerosis of native arteries of left leg with ulceration of other part of lower left leg: Secondary | ICD-10-CM | POA: Diagnosis not present

## 2018-06-14 DIAGNOSIS — L97521 Non-pressure chronic ulcer of other part of left foot limited to breakdown of skin: Secondary | ICD-10-CM | POA: Diagnosis not present

## 2018-06-14 DIAGNOSIS — Z9889 Other specified postprocedural states: Secondary | ICD-10-CM | POA: Insufficient documentation

## 2018-06-14 DIAGNOSIS — M810 Age-related osteoporosis without current pathological fracture: Secondary | ICD-10-CM | POA: Diagnosis not present

## 2018-06-14 DIAGNOSIS — Z794 Long term (current) use of insulin: Secondary | ICD-10-CM | POA: Diagnosis not present

## 2018-06-14 DIAGNOSIS — H409 Unspecified glaucoma: Secondary | ICD-10-CM | POA: Diagnosis not present

## 2018-06-14 DIAGNOSIS — Z7951 Long term (current) use of inhaled steroids: Secondary | ICD-10-CM | POA: Insufficient documentation

## 2018-06-14 DIAGNOSIS — E11621 Type 2 diabetes mellitus with foot ulcer: Secondary | ICD-10-CM | POA: Diagnosis not present

## 2018-06-14 DIAGNOSIS — Z90710 Acquired absence of both cervix and uterus: Secondary | ICD-10-CM | POA: Insufficient documentation

## 2018-06-14 DIAGNOSIS — I7025 Atherosclerosis of native arteries of other extremities with ulceration: Principal | ICD-10-CM | POA: Insufficient documentation

## 2018-06-14 DIAGNOSIS — I129 Hypertensive chronic kidney disease with stage 1 through stage 4 chronic kidney disease, or unspecified chronic kidney disease: Secondary | ICD-10-CM | POA: Insufficient documentation

## 2018-06-14 DIAGNOSIS — I70299 Other atherosclerosis of native arteries of extremities, unspecified extremity: Secondary | ICD-10-CM

## 2018-06-14 HISTORY — PX: LOWER EXTREMITY ANGIOGRAPHY: CATH118251

## 2018-06-14 LAB — CBC
HCT: 34.9 % — ABNORMAL LOW (ref 35.0–47.0)
HEMOGLOBIN: 11.8 g/dL — AB (ref 12.0–16.0)
MCH: 31.9 pg (ref 26.0–34.0)
MCHC: 33.8 g/dL (ref 32.0–36.0)
MCV: 94.2 fL (ref 80.0–100.0)
PLATELETS: 187 10*3/uL (ref 150–440)
RBC: 3.71 MIL/uL — ABNORMAL LOW (ref 3.80–5.20)
RDW: 14.6 % — ABNORMAL HIGH (ref 11.5–14.5)
WBC: 7.4 10*3/uL (ref 3.6–11.0)

## 2018-06-14 LAB — CREATININE, SERUM
Creatinine, Ser: 1.14 mg/dL — ABNORMAL HIGH (ref 0.44–1.00)
GFR calc non Af Amer: 43 mL/min — ABNORMAL LOW (ref >60)
GFR, EST AFRICAN AMERICAN: 49 mL/min — AB (ref >60)

## 2018-06-14 LAB — BUN: BUN: 28 mg/dL — ABNORMAL HIGH (ref 8–23)

## 2018-06-14 LAB — GLUCOSE, CAPILLARY
GLUCOSE-CAPILLARY: 132 mg/dL — AB (ref 70–99)
GLUCOSE-CAPILLARY: 225 mg/dL — AB (ref 70–99)
Glucose-Capillary: 142 mg/dL — ABNORMAL HIGH (ref 70–99)
Glucose-Capillary: 148 mg/dL — ABNORMAL HIGH (ref 70–99)

## 2018-06-14 LAB — MRSA PCR SCREENING: MRSA by PCR: NEGATIVE

## 2018-06-14 LAB — PROTIME-INR
INR: 0.94
PROTHROMBIN TIME: 12.5 s (ref 11.4–15.2)

## 2018-06-14 LAB — APTT: aPTT: 33 seconds (ref 24–36)

## 2018-06-14 SURGERY — LOWER EXTREMITY ANGIOGRAPHY
Anesthesia: Moderate Sedation | Laterality: Left

## 2018-06-14 MED ORDER — HYDROMORPHONE HCL 1 MG/ML IJ SOLN
0.5000 mg | Freq: Once | INTRAMUSCULAR | Status: AC
  Administered 2018-06-14: 0.5 mg via INTRAVENOUS

## 2018-06-14 MED ORDER — ACETAMINOPHEN 325 MG PO TABS: 650.0000 mg | ORAL_TABLET | ORAL | Status: DC | PRN

## 2018-06-14 MED ORDER — NITROGLYCERIN 0.4 MG SL SUBL: 0.4000 mg | SUBLINGUAL_TABLET | SUBLINGUAL | Status: DC | PRN

## 2018-06-14 MED ORDER — HYDROMORPHONE HCL 1 MG/ML IJ SOLN: 1.0000 mg | Freq: Once | INTRAMUSCULAR | Status: DC | PRN

## 2018-06-14 MED ORDER — HEPARIN SODIUM (PORCINE) 1000 UNIT/ML IJ SOLN
INTRAMUSCULAR | Status: AC
  Filled 2018-06-14: qty 1

## 2018-06-14 MED ORDER — ROSUVASTATIN CALCIUM 5 MG PO TABS: 5.0000 mg | ORAL_TABLET | ORAL | Status: DC

## 2018-06-14 MED ORDER — MIDAZOLAM HCL 5 MG/5ML IJ SOLN
INTRAMUSCULAR | Status: AC
  Filled 2018-06-14: qty 5

## 2018-06-14 MED ORDER — HEPARIN (PORCINE) IN NACL 100-0.45 UNIT/ML-% IJ SOLN
950.0000 [IU]/h | INTRAMUSCULAR | Status: DC
  Administered 2018-06-14: 750 [IU]/h via INTRAVENOUS

## 2018-06-14 MED ORDER — LIDOCAINE HCL (PF) 1 % IJ SOLN
INTRAMUSCULAR | Status: AC
  Filled 2018-06-14: qty 30

## 2018-06-14 MED ORDER — MIDAZOLAM HCL 2 MG/2ML IJ SOLN
INTRAMUSCULAR | Status: DC | PRN
  Administered 2018-06-14: 1 mg via INTRAVENOUS
  Administered 2018-06-14: 2 mg via INTRAVENOUS
  Administered 2018-06-14: 0.5 mg via INTRAVENOUS

## 2018-06-14 MED ORDER — MORPHINE SULFATE (PF) 4 MG/ML IV SOLN: 2.0000 mg | INTRAVENOUS | Status: DC | PRN

## 2018-06-14 MED ORDER — FENTANYL CITRATE (PF) 100 MCG/2ML IJ SOLN
INTRAMUSCULAR | Status: DC | PRN
  Administered 2018-06-14: 50 ug via INTRAVENOUS
  Administered 2018-06-14: 12.5 ug via INTRAVENOUS
  Administered 2018-06-14: 50 ug via INTRAVENOUS

## 2018-06-14 MED ORDER — ONDANSETRON HCL 4 MG/2ML IJ SOLN: 4.0000 mg | Freq: Four times a day (QID) | INTRAMUSCULAR | Status: DC | PRN

## 2018-06-14 MED ORDER — CEFAZOLIN SODIUM-DEXTROSE 2-4 GM/100ML-% IV SOLN
INTRAVENOUS | Status: AC
  Administered 2018-06-14: 2 g via INTRAVENOUS
  Filled 2018-06-14: qty 100

## 2018-06-14 MED ORDER — SODIUM CHLORIDE 0.9% FLUSH: 3.0000 mL | INTRAVENOUS | Status: DC | PRN

## 2018-06-14 MED ORDER — OXYCODONE HCL 5 MG PO TABS
5.0000 mg | ORAL_TABLET | ORAL | Status: DC | PRN
  Administered 2018-06-15: 5 mg via ORAL
  Filled 2018-06-14: qty 1

## 2018-06-14 MED ORDER — LATANOPROST 0.005 % OP SOLN
1.0000 [drp] | Freq: Every day | OPHTHALMIC | Status: DC
  Administered 2018-06-14: 1 [drp] via OPHTHALMIC
  Filled 2018-06-14: qty 2.5

## 2018-06-14 MED ORDER — HEPARIN (PORCINE) IN NACL 100-0.45 UNIT/ML-% IJ SOLN
INTRAMUSCULAR | Status: AC
  Filled 2018-06-14: qty 250

## 2018-06-14 MED ORDER — LEVOTHYROXINE SODIUM 50 MCG PO TABS
50.0000 ug | ORAL_TABLET | Freq: Every day | ORAL | Status: DC
  Administered 2018-06-15: 50 ug via ORAL
  Filled 2018-06-14: qty 1

## 2018-06-14 MED ORDER — HEPARIN SODIUM (PORCINE) 1000 UNIT/ML IJ SOLN
INTRAMUSCULAR | Status: DC | PRN
  Administered 2018-06-14: 5000 [IU] via INTRAVENOUS

## 2018-06-14 MED ORDER — HYDROMORPHONE HCL 1 MG/ML IJ SOLN
INTRAMUSCULAR | Status: AC
  Administered 2018-06-14: 0.5 mg via INTRAVENOUS
  Filled 2018-06-14: qty 0.5

## 2018-06-14 MED ORDER — OLMESARTAN MEDOXOMIL-HCTZ 40-25 MG PO TABS
1.0000 | ORAL_TABLET | Freq: Every day | ORAL | Status: DC
Start: 2018-06-14 — End: 2018-06-14

## 2018-06-14 MED ORDER — HYDROCHLOROTHIAZIDE 25 MG PO TABS
25.0000 mg | ORAL_TABLET | Freq: Every day | ORAL | Status: DC
  Administered 2018-06-14 – 2018-06-15 (×2): 25 mg via ORAL
  Filled 2018-06-14 (×2): qty 1

## 2018-06-14 MED ORDER — COLLAGENASE 250 UNIT/GM EX OINT
1.0000 "application " | TOPICAL_OINTMENT | Freq: Every day | CUTANEOUS | Status: DC
  Administered 2018-06-14 – 2018-06-15 (×2): 1 via TOPICAL
  Filled 2018-06-14: qty 30

## 2018-06-14 MED ORDER — METOPROLOL SUCCINATE ER 50 MG PO TB24
100.0000 mg | ORAL_TABLET | Freq: Two times a day (BID) | ORAL | Status: DC
  Administered 2018-06-14 – 2018-06-15 (×2): 100 mg via ORAL
  Filled 2018-06-14 (×2): qty 2

## 2018-06-14 MED ORDER — ASPIRIN 81 MG PO CHEW
81.0000 mg | CHEWABLE_TABLET | Freq: Every day | ORAL | Status: DC
  Administered 2018-06-14 – 2018-06-15 (×2): 81 mg via ORAL
  Filled 2018-06-14 (×2): qty 1

## 2018-06-14 MED ORDER — INSULIN ASPART 100 UNIT/ML ~~LOC~~ SOLN
0.0000 [IU] | Freq: Three times a day (TID) | SUBCUTANEOUS | Status: DC
  Administered 2018-06-14: 2 [IU] via SUBCUTANEOUS
  Administered 2018-06-15: 8 [IU] via SUBCUTANEOUS
  Administered 2018-06-15: 5 [IU] via SUBCUTANEOUS
  Filled 2018-06-14 (×3): qty 1

## 2018-06-14 MED ORDER — AMLODIPINE BESYLATE 5 MG PO TABS
10.0000 mg | ORAL_TABLET | Freq: Every day | ORAL | Status: DC
  Administered 2018-06-14 – 2018-06-15 (×2): 10 mg via ORAL
  Filled 2018-06-14 (×2): qty 2

## 2018-06-14 MED ORDER — VITAMIN D 1000 UNITS PO TABS
2000.0000 [IU] | ORAL_TABLET | Freq: Every day | ORAL | Status: DC
  Administered 2018-06-14 – 2018-06-15 (×2): 2000 [IU] via ORAL
  Filled 2018-06-14 (×4): qty 2

## 2018-06-14 MED ORDER — FLUTICASONE PROPIONATE 50 MCG/ACT NA SUSP
1.0000 | Freq: Every day | NASAL | Status: DC
  Administered 2018-06-14 – 2018-06-15 (×2): 1 via NASAL
  Filled 2018-06-14: qty 16

## 2018-06-14 MED ORDER — ATORVASTATIN CALCIUM 10 MG PO TABS: 10.0000 mg | ORAL_TABLET | Freq: Every day | ORAL | Status: DC

## 2018-06-14 MED ORDER — FENTANYL CITRATE (PF) 100 MCG/2ML IJ SOLN
INTRAMUSCULAR | Status: AC
  Filled 2018-06-14: qty 2

## 2018-06-14 MED ORDER — METHYLPREDNISOLONE SODIUM SUCC 125 MG IJ SOLR: 125.0000 mg | INTRAMUSCULAR | Status: DC | PRN

## 2018-06-14 MED ORDER — SODIUM CHLORIDE 0.9 % IV SOLN
INTRAVENOUS | Status: DC
  Administered 2018-06-14: 1000 mL via INTRAVENOUS

## 2018-06-14 MED ORDER — IRBESARTAN 150 MG PO TABS
300.0000 mg | ORAL_TABLET | Freq: Every day | ORAL | Status: DC
  Administered 2018-06-14 – 2018-06-15 (×2): 300 mg via ORAL
  Filled 2018-06-14 (×2): qty 2

## 2018-06-14 MED ORDER — SODIUM CHLORIDE 0.9 % IV SOLN
INTRAVENOUS | Status: AC
  Administered 2018-06-14: 17:00:00 via INTRAVENOUS

## 2018-06-14 MED ORDER — FAMOTIDINE 20 MG PO TABS: 40.0000 mg | ORAL_TABLET | ORAL | Status: DC | PRN

## 2018-06-14 MED ORDER — ISOSORBIDE MONONITRATE ER 30 MG PO TB24
30.0000 mg | ORAL_TABLET | Freq: Every day | ORAL | Status: DC
  Administered 2018-06-14 – 2018-06-15 (×2): 30 mg via ORAL
  Filled 2018-06-14 (×2): qty 1

## 2018-06-14 MED ORDER — SODIUM CHLORIDE 0.9 % IV SOLN: 250.0000 mL | INTRAVENOUS | Status: DC | PRN

## 2018-06-14 MED ORDER — SODIUM CHLORIDE 0.9% FLUSH
3.0000 mL | Freq: Two times a day (BID) | INTRAVENOUS | Status: DC
  Administered 2018-06-15: 3 mL via INTRAVENOUS

## 2018-06-14 MED ORDER — HEPARIN (PORCINE) IN NACL 1000-0.9 UT/500ML-% IV SOLN
INTRAVENOUS | Status: AC
  Filled 2018-06-14: qty 1000

## 2018-06-14 MED ORDER — IOPAMIDOL (ISOVUE-300) INJECTION 61%
INTRAVENOUS | Status: DC | PRN
  Administered 2018-06-14: 80 mL via INTRA_ARTERIAL

## 2018-06-14 SURGICAL SUPPLY — 41 items
BALLN LUTONIX 018 4X80X130 (BALLOONS) ×3
BALLN LUTONIX 018 5X100X130 (BALLOONS) ×3
BALLN ULTRASCORE 014 3X40X150 (BALLOONS) ×3
BALLN ULTRASCORE 4X40X130 (BALLOONS) ×3
BALLN ULTRSCOR 014 2.5X200X150 (BALLOONS) ×3
BALLN ULTRVRSE 2.5X300X150 (BALLOONS) ×3
BALLN ULTRVRSE 2.5X40X150 (BALLOONS) ×3
BALLN ULTRVRSE 2X100X150 (BALLOONS) ×3
BALLN ULTRVRSE 2X20X150 (BALLOONS) ×3
BALLOON LUTONIX 018 4X80X130 (BALLOONS) ×1 IMPLANT
BALLOON LUTONIX 018 5X100X130 (BALLOONS) ×1 IMPLANT
BALLOON ULTRASCORE 4X40X130 (BALLOONS) ×1 IMPLANT
BALLOON ULTRSC 014 2.5X200X150 (BALLOONS) ×1 IMPLANT
BALLOON ULTRSCRE 014 3X40X150 (BALLOONS) ×1 IMPLANT
BALLOON ULTRVRSE 150X40X2.5 (BALLOONS) ×1 IMPLANT
BALLOON ULTRVRSE 2.5X300X150 (BALLOONS) ×1 IMPLANT
BALLOON ULTRVRSE 2X100X150 (BALLOONS) ×1 IMPLANT
BALLOON ULTRVRSE 2X20X150 (BALLOONS) ×1 IMPLANT
CATH CXI SUPP ST 2.6FR 150CM (CATHETERS) ×3 IMPLANT
CATH MICROCATH PRGRT 2.8F 130 (MICROCATHETER) ×1 IMPLANT
CATH PIG 70CM (CATHETERS) ×3 IMPLANT
CATH SEEKER .018X150 (CATHETERS) ×3 IMPLANT
CATH VERT 5FR 125CM (CATHETERS) ×3 IMPLANT
DEVICE PRESTO INFLATION (MISCELLANEOUS) ×3 IMPLANT
DEVICE SAFEGUARD 24CM (GAUZE/BANDAGES/DRESSINGS) ×3 IMPLANT
DEVICE STARCLOSE SE CLOSURE (Vascular Products) ×3 IMPLANT
DEVICE TORQUE .025-.038 (MISCELLANEOUS) ×3 IMPLANT
GLIDEWIRE ADV .014X300CM (WIRE) ×3 IMPLANT
GUIDEWIRE PFTE-COATED .018X300 (WIRE) ×3 IMPLANT
MICROCATH PROGREAT 2.8F 130CM (MICROCATHETER) ×3
NEEDLE ENTRY 21GA 7CM ECHOTIP (NEEDLE) ×3 IMPLANT
PACK ANGIOGRAPHY (CUSTOM PROCEDURE TRAY) ×3 IMPLANT
SET INTRO CAPELLA COAXIAL (SET/KITS/TRAYS/PACK) ×3 IMPLANT
SHEATH BRITE TIP 5FRX11 (SHEATH) ×3 IMPLANT
SHEATH RAABE 6FR (SHEATH) ×3 IMPLANT
SHIELD X-DRAPE GOLD 12X17 (MISCELLANEOUS) ×3 IMPLANT
TUBING CONTRAST HIGH PRESS 72 (TUBING) ×3 IMPLANT
VALVE HEMO TOUHY BORST Y (ADAPTER) ×3 IMPLANT
WIRE AQUATRACK .035X260CM (WIRE) ×3 IMPLANT
WIRE G V18X300CM (WIRE) ×3 IMPLANT
WIRE J 3MM .035X145CM (WIRE) ×3 IMPLANT

## 2018-06-14 NOTE — H&P (Signed)
[facility] History & Physical Update  The patient was interviewed and re-examined.  The patient's previous History and Physical has been reviewed and is unchanged.  There is no change in the plan of care. We plan to proceed with the scheduled procedure.  Hortencia Pilar, MD  06/14/2018, 12:05 PM

## 2018-06-14 NOTE — Op Note (Signed)
[facility] Percutaneous Study/Intervention Procedural Note   Date of Surgery: 06/14/2018  Surgeon: Hortencia Pilar  Pre-operative Diagnosis: Atherosclerotic occlusive disease bilateral lower extremities with ulcerations of the left lower extremity  Post-operative diagnosis: Same  Procedure(s) Performed: 1. Introduction catheter into left lower extremity 3rd order catheter placement  2. Contrast injection left lower extremity for distal runoff  3. Percutaneous transluminal angioplasty left popliteal arteries to 5 mm with Lutonix drug-eluting balloon 4. Percutaneous transluminal angioplasty left posterior tibial beginning with a 2 mm balloon distally and extending up to a 4 mm balloon in the tibioperoneal trunk  5. Star close closure right common femoral arteriotomy  Anesthesia: Conscious sedation was administered under my direct supervision by the interventional radiology RN. IV Versed plus fentanyl were utilized. Continuous ECG, pulse oximetry and blood pressure was monitored throughout the entire procedure.  Conscious sedation was for a total of 1 hour 57 minutes.  Sheath: 6 [place] Raby right common femoral artery  Contrast: 80 cc  Fluoroscopy Time: 26.2 minutes  Indications: Jody Dorsey presents with increasing pain of the left lower extremity.  She also has multiple ulcerations of the foot which have been nonhealing.  This suggests the patient is having limb threatening ischemia. The risks and benefits are reviewed all questions answered patient agrees to proceed.  Procedure:Jody Dorsey is a 84 y.o. y.o. female who was identified and appropriate procedural time out was performed. The patient was then placed supine on the table and prepped and draped in the usual sterile fashion.   Ultrasound was placed in the sterile sleeve and the right groin was evaluated the right common  femoral artery was echolucent and pulsatile indicating patency. Image was recorded for the permanent record and under real-time visualization a microneedle was inserted into the common femoral artery followed by the microwire and then the micro-sheath. A J-wire was then advanced through the micro-sheath and a 5 [place] sheath was then inserted over a J-wire. J-wire was then advanced and a 5 French pigtail catheter was positioned at the level of T12.  AP projection of the aorta was then obtained. Pigtail catheter was repositioned to above the bifurcation and a RAO view of the pelvis was obtained. Subsequently a pigtail catheter with the stiff angle Glidewire was used to cross the aortic bifurcation the catheter wire were advanced down into the left distal external iliac artery. Oblique view of the femoral bifurcation was then obtained and subsequently the wire was reintroduced and the pigtail catheter negotiated into the SFA representing third order catheter placement. Distal runoff was then performed.  5000 units of heparin was then given and allowed to circulate and a 6 [place] Raby sheath was advanced up and over the bifurcation and positioned in the femoral artery  A 125 vertebral catheter and stiff angle Glidewire were then negotiated down into the distal popliteal. Catheter was then advanced. Hand injection contrast demonstrated the tibial anatomy in detail.  At this point using a combination of the 0.035 aqua track wire as well as a V 18 wire and an advantage wire both 0.018 and 0.014 sizes in association with both the CXI catheter and a seeker catheter wire and catheter were negotiated down below the level of the heel.  The posterior tibial artery demonstrated multiple long segment occlusions throughout its course.  There was reconstitution of the lateral plantar vessel.  Beginning with a 2 mm balloon and serially advancing up to a 3 mm balloon in the posterior tibial proper  and a 4 mm balloon in the  tibioperoneal trunk these were used to angioplasty the posterior tibial essentially throughout its entire length. Each inflation was for 1 minutes at 10-12 atm. Follow-up imaging demonstrated patency of the posterior tibial with less than 20% residual stenosis there is very sluggish flow consistent with high resistance outflow and severe small vessel disease of the forefoot.    The detector was then repositioned and the popliteal was reimaged greater than 80% stenosis was noted at 2 locations within the below-knee popliteal stenosis is noted in this area and after appropriate measurements are made a 4 mm and subsequently a 6 mm diameter Lutonix balloon was used to angioplasty the below-knee popliteal artery. Inflations were to 12 atmospheres for 2 minutes. Follow-up imaging demonstrated patency with excellent result. Distal runoff was then reassessed and noted to be patent.  At this point there appeared to be some extravasation noted in the mid posterior tibial from a very small branch.  A pro-glide catheter and wire combination were used to access this branch.  I was able to track the catheter into this area and after several manipulations it appeared that the original site of extravasation was now thrombosed.  Injection from the posterior tibial no longer filled this area and appears to have sealed this leak.  After review of these images the sheath is pulled into the right external iliac oblique of the common femoral is obtained and a Star close device deployed. There no immediate Complications.  Findings: The abdominal aorta is opacified with a bolus injection contrast. Renal arteries are single and patent. The aorta itself has diffuse disease but no hemodynamically significant lesions. The common and external iliac arteries are widely patent bilaterally.  The left common femoral is widely patent as is the profunda femoris.  The SFA is patent with diffuse disease but no heme dynamically significant  lesions however the popliteal and the below-knee area does indeed have tandem significant stenosis greater than 80%.  The distal popliteal demonstrates increasing disease and the trifurcation is heavily diseased with occlusion of the anterior tibial and peroneal.  The posterior tibial and tibioperoneal trunk demonstrates a multiple segments of occlusion throughout its course.  Following angioplasty the posterior tibial now has in-line flow and less than 20% residual stenosis. Angioplasty of the popliteal yields an excellent result with less than 10% residual stenosis.  Summary: Successful recanalization left lower extremity for limb salvage   Disposition: Patient was taken to the recovery room in stable condition having tolerated the procedure well.  Jody Dorsey 06/14/2018,2:40 PM

## 2018-06-14 NOTE — Progress Notes (Signed)
[place] for heparin dosing Indication: atherosclerosis with ulcers left leg  Allergies  Allergen Reactions  . Ace Inhibitors     Other reaction(s): Unknown Noted by [facility]  . Amlodipine     Other reaction(s): Unknown  . Norvasc [Amlodipine Besylate]     Generic Norvasc-->cough  . Simvastatin     Other reaction(s): Unknown Noted by [facility]   . Celebrex [Celecoxib] Rash    Hives, itching      Patient Measurements: Height: 5\' 4"  (162.6 cm) Weight: 161 lb (73 kg) IBW/kg (Calculated) : 54.7 Heparin Dosing Weight: 62kg  Vital Signs: Temp: 98.3 F (36.8 C) (08/06 1106) Temp Source: Oral (08/06 1106) BP: 150/72 (08/06 1544) Pulse Rate: 68 (08/06 1544)  Labs: Recent Labs    06/14/18 1113  CREATININE 1.14*    Estimated Creatinine Clearance: 35.3 mL/min (A) (by C-G formula based on SCr of 1.14 mg/dL (H)).   Medical History: Past Medical History:  Diagnosis Date  . CAD (coronary artery disease)    status post 2 stents (Dr. Claiborne Billings, Dr. Elisabeth Cara, Dr. Fath-cardiologist) 2008; MI, 10/2008  . Colonic adenoma    unspecified  . Diabetes mellitus type 2, uncomplicated ([place])   . DM (diabetes mellitus) ([place])   . Glaucoma   . HTN (hypertension)   . Microalbuminuria    diabetic  . Osteoarthritis   . Thyroid disease     Medications:  Scheduled:  . amLODipine  10 mg Oral Daily  . aspirin  81 mg Oral Daily  . atorvastatin  10 mg Oral q1800  . cholecalciferol  2,000 Units Oral Daily  . collagenase  1 application Topical Daily  . fluticasone  1 spray Each Nare Daily  . irbesartan  300 mg Oral Daily   And  . hydrochlorothiazide  25 mg Oral Daily  . insulin aspart  0-15 Units Subcutaneous TID WC  . isosorbide mononitrate  30 mg Oral Daily  . latanoprost  1 drop Both Eyes QHS  . [START ON 06/15/2018] levothyroxine  50 mcg Oral QAC breakfast  . metoprolol succinate  100 mg Oral BID  . [START ON 06/19/2018] rosuvastatin  5 mg Oral Q Sun  .  sodium chloride flush  3 mL Intravenous Q12H    Assessment: 84 year-old female s/p angiogram procedure d/t atherosclerosis with ulcers left leg. She is on no anticoagulants PTA. The heparin drip was started immediately following the procedure  Goal of Therapy:  Heparin level 0.3-0.7 units/ml Monitor platelets by anticoagulation protocol: Yes   Plan:  Start heparin infusion at 750 units/hr Check anti-Xa level in 8 hours and daily while on heparin Continue to monitor H&H and platelets  Dallie Piles, PharmD 06/14/2018,4:33 PM

## 2018-06-15 ENCOUNTER — Encounter: Payer: Self-pay | Admitting: Vascular Surgery

## 2018-06-15 DIAGNOSIS — I7025 Atherosclerosis of native arteries of other extremities with ulceration: Secondary | ICD-10-CM | POA: Diagnosis not present

## 2018-06-15 DIAGNOSIS — I70248 Atherosclerosis of native arteries of left leg with ulceration of other part of lower left leg: Secondary | ICD-10-CM

## 2018-06-15 LAB — CBC
HEMATOCRIT: 35.7 % (ref 35.0–47.0)
HEMOGLOBIN: 12.2 g/dL (ref 12.0–16.0)
MCH: 31.8 pg (ref 26.0–34.0)
MCHC: 34.1 g/dL (ref 32.0–36.0)
MCV: 93.2 fL (ref 80.0–100.0)
Platelets: 186 10*3/uL (ref 150–440)
RBC: 3.83 MIL/uL (ref 3.80–5.20)
RDW: 14.5 % (ref 11.5–14.5)
WBC: 7.5 10*3/uL (ref 3.6–11.0)

## 2018-06-15 LAB — GLUCOSE, CAPILLARY
Glucose-Capillary: 217 mg/dL — ABNORMAL HIGH (ref 70–99)
Glucose-Capillary: 265 mg/dL — ABNORMAL HIGH (ref 70–99)

## 2018-06-15 LAB — HEPARIN LEVEL (UNFRACTIONATED)
Heparin Unfractionated: 0.16 IU/mL — ABNORMAL LOW (ref 0.30–0.70)
Heparin Unfractionated: 0.29 IU/mL — ABNORMAL LOW (ref 0.30–0.70)

## 2018-06-15 MED ORDER — METOPROLOL TARTRATE 5 MG/5ML IV SOLN
5.0000 mg | Freq: Once | INTRAVENOUS | Status: AC
  Administered 2018-06-15: 5 mg via INTRAVENOUS
  Filled 2018-06-15: qty 5

## 2018-06-15 MED ORDER — CLOPIDOGREL BISULFATE 75 MG PO TABS: 75.0000 mg | ORAL_TABLET | Freq: Every day | ORAL | 11 refills | Status: DC

## 2018-06-15 MED ORDER — HEPARIN BOLUS VIA INFUSION
1900.0000 [IU] | Freq: Once | INTRAVENOUS | Status: AC
  Administered 2018-06-15: 1900 [IU] via INTRAVENOUS
  Filled 2018-06-15: qty 1900

## 2018-06-15 MED ORDER — CLOPIDOGREL BISULFATE 75 MG PO TABS: 300.0000 mg | ORAL_TABLET | ORAL | Status: DC

## 2018-06-15 NOTE — Care Management Obs Status (Signed)
[facility] NOTIFICATION   Patient Details  Name: Jody Dorsey MRN: [id number] Date of Birth: 1930-01-20   Medicare Observation Status Notification Given:  Yes    Beverly Sessions, RN 06/15/2018, 1:56 PM

## 2018-06-15 NOTE — Progress Notes (Signed)
Pt discharged back to [facility] per MD order. IV removed. Pt transported via [employer].

## 2018-06-15 NOTE — Clinical Social Work Note (Signed)
Clinical Social Work Assessment  Patient Details  Name: Jody Dorsey MRN: [id number] Date of Birth: 11/05/1930  Date of referral:  06/15/18               Reason for consult:  Discharge Planning                Permission sought to share information with:    Permission granted to share information::     Name::        Agency::     Relationship::     Contact Information:     Housing/Transportation Living arrangements for the past 2 months:  [place] of Information:  Adult Children Patient Interpreter Needed:  None Criminal Activity/Legal Involvement Pertinent to Current Situation/Hospitalization:  No - Comment as needed Significant Relationships:  Adult Children Lives with:  Facility Resident Do you feel safe going back to the place where you live?  Yes Need for family participation in patient care:  Yes (Comment)  Care giving concerns:  Patient resides at [facility] long term care.   Social Worker assessment / plan:  CSW spoke with patient and her daughter: Jody Dorsey General: [phone] at bedside. Patient did not talk during [place] visit but did smile on occasion. Patient's daughter confirmed that patient is from [place] and that she would return at discharge. She stated she was expecting a discharge today and she has already made transportation arrangements with [facility] at [facility]. CSW confirmed this with Joelene Millin at [facility].  Employment status:  Retired Forensic scientist:    PT Recommendations:    Information / Referral to community resources:     Patient/Family's Response to care:  Patient's daughter expressed appreciation for CSW assistance.  Patient/Family's Understanding of and Emotional Response to Diagnosis, Current Treatment, and Prognosis:  Patient's daughter is expecting patient to discharge today.  Emotional Assessment Appearance:  Appears stated age Attitude/Demeanor/Rapport:  (quiet and did not speak during assessment but did smile) Affect  (typically observed):  Calm Orientation:    Alcohol / Substance use:  Not Applicable Psych involvement (Current and /or in the community):  No (Comment)  Discharge Needs  Concerns to be addressed:  Care Coordination Readmission within the last 30 days:  No Current discharge risk:  None Barriers to Discharge:  No Barriers Identified   Shela Leff, LCSW 06/15/2018, 11:53 AM

## 2018-06-15 NOTE — Progress Notes (Signed)
ANTICOAGULATION CONSULT NOTE - Follow Up Consult  Pharmacy Consult for heparin Indication: atherosclerosis with ulcers left leg  Allergies  Allergen Reactions  . Ace Inhibitors     Other reaction(s): Unknown Noted by [facility]  . Amlodipine     Other reaction(s): Unknown  . Norvasc [Amlodipine Besylate]     Generic Norvasc-->cough  . Simvastatin     Other reaction(s): Unknown Noted by [facility]   . Celebrex [Celecoxib] Rash    Hives, itching      Patient Measurements: Height: 5\' 4"  (162.6 cm) Weight: 161 lb (73 kg) IBW/kg (Calculated) : 54.7 Heparin Dosing Weight: 70 kg  Vital Signs: Temp: 98.5 F (36.9 C) (08/07 0531) Temp Source: Oral (08/07 0531) BP: 177/79 (08/07 2633) Pulse Rate: 79 (08/07 0632)  Labs: Recent Labs    06/14/18 1113 06/14/18 2126 06/15/18 0054 06/15/18 0959  HGB  --  11.8*  --  12.2  HCT  --  34.9*  --  35.7  PLT  --  187  --  186  APTT  --  33  --   --   LABPROT  --  12.5  --   --   INR  --  0.94  --   --   HEPARINUNFRC  --   --  0.16* 0.29*  CREATININE 1.14*  --   --   --     Estimated Creatinine Clearance: 35.3 mL/min (A) (by C-G formula based on SCr of 1.14 mg/dL (H)).  Assessment: 84 year-old female s/p angiogram procedure d/t atherosclerosis with ulcers left leg. She is on no anticoagulants PTA. The heparin drip was started immediately following the procedure  Goal of Therapy:  Heparin level 0.3-0.7 units/ml Monitor platelets by anticoagulation protocol: Yes   Plan:  8/7 0100 Heparin level 0.16, 1900 unit bolus and increase rate to 850 units/hr  8/7 1000 Heparin level 0.29, increase rate to 950 units/hr. Will recheck heparin level in 8 hours   Tawnya Crook, PharmD Pharmacy Resident  06/15/2018 10:47 AM

## 2018-06-15 NOTE — Discharge Summary (Signed)
[facility]    Discharge Summary    Patient ID:  Jody Dorsey MRN: [id number] DOB/AGE: 82-12-1929 82 y.o.  Admit date: 06/14/2018 Discharge date: 06/15/2018 Date of Surgery: 06/14/2018 Surgeon: Surgeon(s): Morio Widen, Dolores Lory, MD  Admission Diagnosis: LT lower extremity angio     ASO w ulceration  Discharge Diagnoses:  LT lower extremity angio     ASO w ulceration  Secondary Diagnoses: Past Medical History:  Diagnosis Date  . CAD (coronary artery disease)    status post 2 stents (Dr. Claiborne Billings, Dr. Elisabeth Cara, Dr. Fath-cardiologist) 2008; MI, 10/2008  . Colonic adenoma    unspecified  . Diabetes mellitus type 2, uncomplicated ([place])   . DM (diabetes mellitus) ([place])   . Glaucoma   . HTN (hypertension)   . Microalbuminuria    diabetic  . Osteoarthritis   . Thyroid disease     Procedure(s): LOWER EXTREMITY ANGIOGRAPHY  Discharged Condition: good  HPI:  On the day of admission the patient presented for angiography with hope for intervention for limb salvage.  She has atherosclerotic occlusive disease bilateral lower extremities with multiple ulcerations of the left foot.  On June 14, 2018 left lower extremity angiography was performed with successful intervention.  She does have rather severe small vessel disease distally and I felt she should be maintained on parenteral anticoagulation for 24 hours.  Heparin drip was started.  Doppler signals were significantly improved and have been stable overnight.  Clinically her foot is much warmer and now has brisk capillary refill.  Postoperative day #1 she is doing well she is stable she is felt fit for discharge and she will return to her skilled nursing.  Hospital Course:  Jody Dorsey is a 84 y.o. female is S/P Left Procedure(s): LOWER EXTREMITY ANGIOGRAPHY Extubated: POD # 0 Physical exam: Left foot is pink and warm with brisk capillary refill.  Pulses are by Doppler signal in the posterior tibial  distribution.  There are not palpable. Post-op wounds clean, dry, intact or healing well Pt. Ambulating, voiding and taking PO diet without difficulty. Pt pain controlled with PO pain meds. Labs as below Complications:none  Consults:    Significant Diagnostic Studies: CBC Lab Results  Component Value Date   WBC 7.5 06/15/2018   HGB 12.2 06/15/2018   HCT 35.7 06/15/2018   MCV 93.2 06/15/2018   PLT 186 06/15/2018    BMET    Component Value Date/Time   NA 136 03/25/2018 1135   NA 137 11/30/2012 1710   K 3.9 03/25/2018 1135   K 3.8 04/25/2013 1030   CL 102 03/25/2018 1135   CL 103 11/30/2012 1710   CO2 24 03/25/2018 1135   CO2 27 11/30/2012 1710   GLUCOSE 153 (H) 03/25/2018 1135   GLUCOSE 196 (H) 11/30/2012 1710   BUN 28 (H) 06/14/2018 1113   BUN 25 (H) 11/30/2012 1710   CREATININE 1.14 (H) 06/14/2018 1113   CREATININE 0.97 11/30/2012 1710   CALCIUM 9.1 03/25/2018 1135   CALCIUM 9.0 11/30/2012 1710   GFRNONAA 43 (L) 06/14/2018 1113   GFRNONAA 55 (L) 11/30/2012 1710   GFRAA 49 (L) 06/14/2018 1113   GFRAA >60 11/30/2012 1710   COAG Lab Results  Component Value Date   INR 0.94 06/14/2018   INR 0.96 11/30/2012   INR 1.0 11/30/2012     Disposition:  Discharge to :Skilled nursing facility Discharge Instructions    Call MD for:  redness, tenderness, or signs of infection (pain, swelling, bleeding,  redness, odor or green/yellow discharge around incision site)   Complete by:  As directed    Call MD for:  severe or increased pain, loss or decreased feeling  in affected limb(s)   Complete by:  As directed    Call MD for:  temperature >100.5   Complete by:  As directed    Discharge instructions   Complete by:  As directed    OK to shower   No dressing needed   Complete by:  As directed    Replace only if drainage present   Resume previous diet   Complete by:  As directed      Allergies as of 06/15/2018      Reactions   Ace Inhibitors    Other reaction(s):  Unknown Noted by [facility]   Amlodipine    Other reaction(s): Unknown   Norvasc [amlodipine Besylate]    Generic Norvasc-->cough   Simvastatin    Other reaction(s): Unknown Noted by [facility]    Celebrex [celecoxib] Rash   Hives, itching       Medication List    TAKE these medications   acetaminophen 650 MG CR tablet Commonly known as:  TYLENOL Take 650 mg by mouth every 12 (twelve) hours.   acetaminophen 325 MG tablet Commonly known as:  TYLENOL Take 650 mg by mouth every 6 (six) hours as needed for moderate pain. do not exceed 3grams in 24hr period   amLODipine 10 MG tablet Commonly known as:  NORVASC Take 10 mg by mouth daily.   aspirin 81 MG chewable tablet Chew 81 mg by mouth daily.   cloNIDine 0.2 MG tablet Commonly known as:  CATAPRES Take 0.4 mg by mouth 2 (two) times daily. (Please let provider know when med order is due to be re-newed from pharmacy- new dose to be called in.)   clopidogrel 75 MG tablet Commonly known as:  PLAVIX Take 1 tablet (75 mg total) by mouth daily. Start taking on:  06/16/2018   collagenase ointment Commonly known as:  SANTYL Apply 1 application topically daily. Cleanse left lateral first toe with ns, skin prep periwound, apply santyl 1/4 inch nickel thick, cover with ns gauze and allevyn.   DERMACLOUD Crea Apply 1 application topically as needed (for skin irritation). Apply liberal amount topically to area of skin irritation as needed. Ok to leave at bedside   dextrose 40 % Gel Commonly known as:  GLUTOSE Take 1 Tube by mouth as needed for low blood sugar. Give 1 tube every 30 minutes Until BS > 70 and pt is still responsive.   fluticasone 50 MCG/ACT nasal spray Commonly known as:  FLONASE Place 1 spray into both nostrils daily.   GLUCAGON EMERGENCY 1 MG injection Generic drug:  glucagon Inject 1 mg into the muscle once as needed. For hypoglycemia (symptomatic) not responding to oral glucose or snack. May repeat x 1 after 15 mins. Use  only if pt is becoming unresponsive.   insulin lispro protamine-lispro (75-25) 100 UNIT/ML Susp injection Commonly known as:  HUMALOG 75/25 MIX Inject 15 Units into the skin 3 (three) times daily with meals.   isosorbide mononitrate 30 MG 24 hr tablet Commonly known as:  IMDUR Take 30 mg by mouth daily.   latanoprost 0.005 % ophthalmic solution Commonly known as:  XALATAN Place 1 drop into both eyes at bedtime.   levothyroxine 50 MCG tablet Commonly known as:  SYNTHROID, LEVOTHROID Take 50 mcg by mouth daily before breakfast.   metFORMIN 500 MG tablet  Commonly known as:  GLUCOPHAGE Take 500 mg by mouth 2 (two) times daily with a meal.   metoprolol succinate 100 MG 24 hr tablet Commonly known as:  TOPROL-XL Take 100 mg by mouth 2 (two) times daily. Take with or immediately following a meal.   MUSCLE RUB 10-15 % Crea Apply 1 application topically 2 (two) times daily as needed.   nitroGLYCERIN 0.4 MG SL tablet Commonly known as:  NITROSTAT Place 0.4 mg under the tongue every 5 (five) minutes as needed for chest pain.   olmesartan-hydrochlorothiazide 40-25 MG tablet Commonly known as:  BENICAR HCT Take 1 tablet by mouth daily.   Potassium Chloride CR 8 MEQ Cpcr capsule CR Commonly known as:  MICRO-K Take 8 mEq by mouth 2 (two) times daily.   rosuvastatin 5 MG tablet Commonly known as:  CRESTOR Take 5 mg by mouth every Sunday. In the evening   senna-docusate 8.6-50 MG tablet Commonly known as:  Senokot-S Take 1 tablet by mouth 2 (two) times daily as needed for constipation.   traMADol 50 MG tablet Commonly known as:  ULTRAM Take 1 tablet (50 mg total) by mouth every 6 (six) hours as needed for moderate pain.   UNABLE TO FIND Diet Change: Please add moisture/gravy as a side on all meals. Diet Type: No Concentrated Sweets   Vitamin D3 2000 units capsule Take 2,000 Units by mouth daily.      Verbal and written Discharge instructions given to the patient. Wound  care per Discharge AVS  Contact information for follow-up providers    Johnathon Mittal, Dolores Lory, MD Follow up in 3 week(s).   Specialties:  Vascular Surgery, Cardiology, Radiology, Vascular Surgery Why:  with ABI's Contact information: [place] [address] [phone]            Contact information for after-discharge care    Destination    [facility] Preferred SNF .   Service:  Skilled Nursing Contact information: [address] [place] [phone]                  Signed: Hortencia Pilar, MD  06/15/2018, 2:16 PM

## 2018-06-15 NOTE — Progress Notes (Addendum)
Inpatient Diabetes Program Recommendations  AACE/ADA: New Consensus Statement on Inpatient Glycemic Control (2019)  Target Ranges:  Prepandial:   less than 140 mg/dL      Peak postprandial:   less than 180 mg/dL (1-2 hours)      Critically ill patients:  140 - 180 mg/dL  Results for SHATAYA, WINKLES (MRN [id number]) as of 06/15/2018 11:21  Ref. Range 06/14/2018 11:04 06/14/2018 14:53 06/14/2018 16:52 06/14/2018 21:34 06/15/2018 07:23  Glucose-Capillary Latest Ref Range: 70 - 99 mg/dL 142 (H) 132 (H) 148 (H) 225 (H) 217 (H)    Review of Glycemic Control  Diabetes history: DM2 Outpatient Diabetes medications: Humalog 75/25 15 units TID with meals, Metformin 500 mg BID Current orders for Inpatient glycemic control: Novolog 0-15 units TID with meals  Inpatient Diabetes Program Recommendations: Insulin - Basal: Please consider ordering Lantus 7 units Q24H (based on 73 kg x 0.1 units). Correction (SSI): Please consider ordering Novolog 0-5 units QHS for bedtime correction scale.  Thanks, Barnie Alderman, RN, MSN, CDE Diabetes Coordinator Inpatient Diabetes Program [phone] (Team Pager from 8am to 5pm)

## 2018-06-15 NOTE — Progress Notes (Signed)
[place] for heparin dosing Indication: atherosclerosis with ulcers left leg  Allergies  Allergen Reactions  . Ace Inhibitors     Other reaction(s): Unknown Noted by [facility]  . Amlodipine     Other reaction(s): Unknown  . Norvasc [Amlodipine Besylate]     Generic Norvasc-->cough  . Simvastatin     Other reaction(s): Unknown Noted by [facility]   . Celebrex [Celecoxib] Rash    Hives, itching      Patient Measurements: Height: 5\' 4"  (162.6 cm) Weight: 161 lb (73 kg) IBW/kg (Calculated) : 54.7 Heparin Dosing Weight: 62kg  Vital Signs: Temp: 97.8 F (36.6 C) (08/06 2004) Temp Source: Oral (08/06 2004) BP: 155/66 (08/06 2004) Pulse Rate: 75 (08/06 2004)  Labs: Recent Labs    06/14/18 1113 06/14/18 2126 06/15/18 0054  HGB  --  11.8*  --   HCT  --  34.9*  --   PLT  --  187  --   APTT  --  33  --   LABPROT  --  12.5  --   INR  --  0.94  --   HEPARINUNFRC  --   --  0.16*  CREATININE 1.14*  --   --     Estimated Creatinine Clearance: 35.3 mL/min (A) (by C-G formula based on SCr of 1.14 mg/dL (H)).   Medical History: Past Medical History:  Diagnosis Date  . CAD (coronary artery disease)    status post 2 stents (Dr. Claiborne Billings, Dr. Elisabeth Cara, Dr. Fath-cardiologist) 2008; MI, 10/2008  . Colonic adenoma    unspecified  . Diabetes mellitus type 2, uncomplicated ([place])   . DM (diabetes mellitus) ([place])   . Glaucoma   . HTN (hypertension)   . Microalbuminuria    diabetic  . Osteoarthritis   . Thyroid disease     Medications:  Scheduled:  . amLODipine  10 mg Oral Daily  . aspirin  81 mg Oral Daily  . cholecalciferol  2,000 Units Oral Daily  . collagenase  1 application Topical Daily  . fluticasone  1 spray Each Nare Daily  . heparin  1,900 Units Intravenous Once  . irbesartan  300 mg Oral Daily   And  . hydrochlorothiazide  25 mg Oral Daily  . insulin aspart  0-15 Units Subcutaneous TID WC  . isosorbide mononitrate  30 mg Oral Daily   . latanoprost  1 drop Both Eyes QHS  . levothyroxine  50 mcg Oral QAC breakfast  . metoprolol succinate  100 mg Oral BID  . [START ON 06/19/2018] rosuvastatin  5 mg Oral Q Sun  . sodium chloride flush  3 mL Intravenous Q12H    Assessment: 84 year-old female s/p angiogram procedure d/t atherosclerosis with ulcers left leg. She is on no anticoagulants PTA. The heparin drip was started immediately following the procedure  Goal of Therapy:  Heparin level 0.3-0.7 units/ml Monitor platelets by anticoagulation protocol: Yes   Plan:  Start heparin infusion at 750 units/hr Check anti-Xa level in 8 hours and daily while on heparin Continue to monitor H&H and platelets   08/07 00100 heparin level 0.16. 1900 unit bolus and increase rate to 850 units/hr. Recheck in 8 hours.  Delaney Perona S, PharmD 06/15/2018,1:36 AM

## 2018-06-15 NOTE — Progress Notes (Signed)
MD Jodell Cipro made aware of BP of 186/80, will give lopressor 5 mg IV as ordered. Will continue to monitor.

## 2018-06-15 NOTE — Progress Notes (Signed)
Report called to Sarah at [facility].

## 2018-06-16 ENCOUNTER — Non-Acute Institutional Stay (SKILLED_NURSING_FACILITY): Payer: Medicare Other | Admitting: Adult Health

## 2018-06-16 ENCOUNTER — Encounter: Payer: Self-pay | Admitting: Adult Health

## 2018-06-16 DIAGNOSIS — N183 Chronic kidney disease, stage 3 (moderate): Secondary | ICD-10-CM

## 2018-06-16 DIAGNOSIS — Z794 Long term (current) use of insulin: Secondary | ICD-10-CM

## 2018-06-16 DIAGNOSIS — I7025 Atherosclerosis of native arteries of other extremities with ulceration: Secondary | ICD-10-CM

## 2018-06-16 DIAGNOSIS — E1121 Type 2 diabetes mellitus with diabetic nephropathy: Secondary | ICD-10-CM

## 2018-06-16 DIAGNOSIS — I25119 Atherosclerotic heart disease of native coronary artery with unspecified angina pectoris: Secondary | ICD-10-CM

## 2018-06-16 DIAGNOSIS — I119 Hypertensive heart disease without heart failure: Secondary | ICD-10-CM | POA: Diagnosis not present

## 2018-06-16 DIAGNOSIS — E785 Hyperlipidemia, unspecified: Secondary | ICD-10-CM

## 2018-06-16 DIAGNOSIS — J309 Allergic rhinitis, unspecified: Secondary | ICD-10-CM | POA: Diagnosis not present

## 2018-06-16 DIAGNOSIS — E1169 Type 2 diabetes mellitus with other specified complication: Secondary | ICD-10-CM

## 2018-06-16 DIAGNOSIS — E1122 Type 2 diabetes mellitus with diabetic chronic kidney disease: Secondary | ICD-10-CM

## 2018-06-16 DIAGNOSIS — E034 Atrophy of thyroid (acquired): Secondary | ICD-10-CM

## 2018-06-16 NOTE — Progress Notes (Signed)
Location:   [facility] Room Number: [phone] Place of Service:  SNF (31)   CODE STATUS: DNR  Allergies  Allergen Reactions  . Ace Inhibitors     Other reaction(s): Unknown Noted by [facility]  . Amlodipine     Other reaction(s): Unknown  . Norvasc [Amlodipine Besylate]     Generic Norvasc-->cough  . Simvastatin     Other reaction(s): Unknown Noted by [facility]   . Celebrex [Celecoxib] Rash    Hives, itching      Chief Complaint  Patient presents with  . Hospitalization Follow-up        HPI:  She is a 84 year old long term resident of this facility has been hospitalized from 06-14-18 through 06-15-18. She had a left lower extremity angiography with successful intervention. She did have an unresponsive period this AM. She is awake and aware at this time; but is unable to participate in the hpi or ros. There are no reports of fevers; no changes in appetite; no uncontrolled pain. She will continue to be followed for her chronic illnesses including: hypertension; diabetes; hypothyroidism.   Past Medical History:  Diagnosis Date  . CAD (coronary artery disease)    status post 2 stents (Dr. Claiborne Billings, Dr. Elisabeth Cara, Dr. Fath-cardiologist) 2008; MI, 10/2008  . Colonic adenoma    unspecified  . Diabetes mellitus type 2, uncomplicated ([place])   . DM (diabetes mellitus) ([place])   . Glaucoma   . HTN (hypertension)   . Microalbuminuria    diabetic  . Osteoarthritis   . Thyroid disease     Past Surgical History:  Procedure Laterality Date  . ABDOMINAL HYSTERECTOMY    . APPENDECTOMY  1949  . CATARACT EXTRACTION     left eye  . CERVICAL LAMINECTOMY  1994  . CESAREAN SECTION     x 3  . COLONOSCOPY W/ POLYPECTOMY  11/2007   (RTE) PCI/DES - Stents x 2 sequential- LAD 12/8/200  . CORONARY ANGIOPLASTY WITH STENT PLACEMENT     x 2  . LOWER EXTREMITY ANGIOGRAPHY Left 06/14/2018   Procedure: LOWER EXTREMITY ANGIOGRAPHY;  Surgeon: Katha Cabal, MD;  Location: [facility];  Service: Cardiovascular;  Laterality: Left;  . ROTATOR CUFF REPAIR Left 1990    Social History   Socioeconomic History  . Marital status: Widowed    Spouse name: Not on file  . Number of children: 3  . Years of education: 43  . Highest education level: 11th grade  Occupational History  . Not on file  Social Needs  . Financial resource strain: Not on file  . Food insecurity:    Worry: Not on file    Inability: Not on file  . Transportation needs:    Medical: Not on file    Non-medical: Not on file  Tobacco Use  . Smoking status: Never Smoker  . Smokeless tobacco: Never Used  Substance and Sexual Activity  . Alcohol use: No  . Drug use: No  . Sexual activity: Never  Lifestyle  . Physical activity:    Days per week: Not on file    Minutes per session: Not on file  . Stress: Not on file  Relationships  . Social connections:    Talks on phone: Not on file    Gets together: Not on file    Attends religious service: Not on file    Active member of club or organization: Not on file    Attends meetings of  clubs or organizations: Not on file    Relationship status: Not on file  . Intimate partner violence:    Fear of current or ex partner: Not on file    Emotionally abused: Not on file    Physically abused: Not on file    Forced sexual activity: Not on file  Other Topics Concern  . Not on file  Social History Narrative   Lives alone. Widowed since 11/12/2011. Denies tobacco    PCP Dr. Kern Alberta [facility]   Lives in [place]    Denies cigarette use    Denies alcohol use   DNR   Admitted [facility] 12/14/2016               Family History  Problem Relation Age of Onset  . Stroke Mother       VITAL SIGNS BP (!) 122/58   Pulse 60   Temp 98.4 F (36.9 C) (Oral)   Resp 18   Ht 5\' 4"  (1.626 m)   Wt 161 lb (73 kg)   SpO2 99%   BMI 27.64 kg/m   Outpatient Encounter Medications as of 06/16/2018  Medication Sig  . acetaminophen  (TYLENOL) 325 MG tablet Take 650 mg by mouth every 6 (six) hours as needed for moderate pain. do not exceed 3grams in 24hr period  . acetaminophen (TYLENOL) 650 MG CR tablet Take 650 mg by mouth every 12 (twelve) hours.   Marland Kitchen amLODipine (NORVASC) 10 MG tablet Take 10 mg by mouth daily.  Marland Kitchen aspirin 81 MG chewable tablet Chew 81 mg by mouth daily.  . Cholecalciferol (VITAMIN D3) 2000 units capsule Take 2,000 Units by mouth daily.   . cloNIDine (CATAPRES) 0.2 MG tablet Take 0.4 mg by mouth 2 (two) times daily. (Please let provider know when med order is due to be re-newed from pharmacy- new dose to be called in.)  . clopidogrel (PLAVIX) 75 MG tablet Take 1 tablet (75 mg total) by mouth daily.  . collagenase (SANTYL) ointment Apply 1 application topically daily. Cleanse left lateral first toe with ns, skin prep periwound, apply santyl 1/4 inch nickel thick, cover with ns gauze and allevyn.  Marland Kitchen dextrose (GLUTOSE) 40 % GEL Take 1 Tube by mouth as needed for low blood sugar. Give 1 tube every 30 minutes Until BS > 70 and pt is still responsive.  . fluticasone (FLONASE) 50 MCG/ACT nasal spray Place 1 spray into both nostrils daily.   Marland Kitchen glucagon (GLUCAGON EMERGENCY) 1 MG injection Inject 1 mg into the muscle once as needed. For hypoglycemia (symptomatic) not responding to oral glucose or snack. May repeat x 1 after 15 mins. Use only if pt is becoming unresponsive.  . Infant Care Products (DERMACLOUD) CREA Apply 1 application topically as needed (for skin irritation). Apply liberal amount topically to area of skin irritation as needed. Ok to leave at bedside   . insulin lispro protamine-lispro (HUMALOG 75/25 MIX) (75-25) 100 UNIT/ML SUSP injection Inject 15 Units into the skin 3 (three) times daily with meals.   . isosorbide mononitrate (IMDUR) 30 MG 24 hr tablet Take 30 mg by mouth daily.  Marland Kitchen latanoprost (XALATAN) 0.005 % ophthalmic solution Place 1 drop into both eyes at bedtime.   Marland Kitchen levothyroxine (SYNTHROID,  LEVOTHROID) 50 MCG tablet Take 50 mcg by mouth daily before breakfast.   . Menthol-Methyl Salicylate (MUSCLE RUB) 10-15 % CREA Apply 1 application topically 2 (two) times daily as needed.  . metFORMIN (GLUCOPHAGE) 500 MG tablet Take  500 mg by mouth 2 (two) times daily with a meal.   . metoprolol succinate (TOPROL-XL) 100 MG 24 hr tablet Take 100 mg by mouth 2 (two) times daily. Take with or immediately following a meal.   . nitroGLYCERIN (NITROSTAT) 0.4 MG SL tablet Place 0.4 mg under the tongue every 5 (five) minutes as needed for chest pain.   Marland Kitchen olmesartan-hydrochlorothiazide (BENICAR HCT) 40-25 MG tablet Take 1 tablet by mouth daily.   . OXYGEN Inhale 2 L into the lungs as needed. For dyspnea ,   Check O2 saturations prior to placing on patient and at least every 4 hours after applying. Notify MD if oxygen saturations drop below baseline while receiving oxygen or no improvement in dyspnea  . Potassium Chloride CR (MICRO-K) 8 MEQ CPCR capsule CR Take 8 mEq by mouth 2 (two) times daily.  . rosuvastatin (CRESTOR) 5 MG tablet Take 5 mg by mouth every Sunday. In the evening  . senna-docusate (SENOKOT-S) 8.6-50 MG per tablet Take 1 tablet by mouth 2 (two) times daily as needed for constipation.  . traMADol (ULTRAM) 50 MG tablet Take 1 tablet (50 mg total) by mouth every 6 (six) hours as needed for moderate pain.  Marland Kitchen UNABLE TO FIND Diet Change: Please add moisture/gravy as a side on all meals. Diet Type: No Concentrated Sweets   No facility-administered encounter medications on file as of 06/16/2018.      SIGNIFICANT DIAGNOSTIC EXAMS  TODAY:  06-14-18: peripheral vascular catheterization:  The abdominal aorta is opacified with a bolus injection contrast. Renal arteries are single and patent. The aorta itself has diffuse disease but no hemodynamically significant lesions. The common and external iliac arteries are widely patent bilaterally.  The left common femoral is widely patent as is the profunda  femoris.  The SFA is patent with diffuse disease but no heme dynamically significant lesions however the popliteal and the below-knee area does indeed have tandem significant stenosis greater than 80%.  The distal popliteal demonstrates increasing disease and the trifurcation is heavily diseased with occlusion of the anterior tibial and peroneal.  The posterior tibial and tibioperoneal trunk demonstrates a multiple segments of occlusion throughout its course.  Following angioplasty the posterior tibial now has in-line flow and less than 20% residual stenosis. Angioplasty of the popliteal yields an excellent result with less than 10% residual stenosis.   LABS REVIEWED: PREVIOUS:   11-16-17: hgb a1c 8.3 chol 234; ldl 144; trig  223; hdl 45 tsh 1.949 03-25-18: wbc 8.0; hgb 12.2; hct 36.6; mcv 94.7; plt 148; glucose 153; bun 37; creat 1.31; k+ 3.9; na++ 136; ca 9.1   TODAY.  06-14-18: wbc 7.4; hgb 11.8; hct 34.9; mcv 94.2; plt 187 06-15-18: wbc 7.5; hgb 12.2; hct 35.7; mcv 93.2; plt 186    Review of Systems  Unable to perform ROS: Other (confusion )     Physical Exam  Constitutional: She appears well-developed and well-nourished. No distress.  Neck: No thyromegaly present.  Cardiovascular: Normal rate, regular rhythm and normal heart sounds.  Bilateral pedal pulses + doppler  Bilateral feet cold   Pulmonary/Chest: Effort normal and breath sounds normal. No respiratory distress.  Abdominal: Soft. Bowel sounds are normal. She exhibits no distension. There is no tenderness.  Musculoskeletal: She exhibits edema.  1+ bilateral lower extremity edema Is able to move all extremities   Lymphadenopathy:    She has no cervical adenopathy.  Neurological: She is alert.  Skin: Skin is warm and dry. She is not diaphoretic.  Psychiatric: She has a normal mood and affect.     ASSESSMENT/ PLAN:  TODAY:     1. Atherosclerotic heart disease of native coronary artery with unspecified angina pectoris: is  status post stent placement: is stable will continue imdur 30 mg daily  asa 81 mg daily toprol xl 100 mg twice daily  has prn ntg  2.  Benign hypertensive heart disease without CHF: is stable b/p 122/58; will continue norvasc 10 mg daily clonidine 0.4 mg twice daily toprol xl 100 mg twice daily benicar hct 40/25 mg daily   3. Atherosclerosis of native arteries of the extremities with ulceration: is status post angiography of the left leg will continue plavix 75 mg daily and asa 81 mg daily   4.  Hypothyroidism due to atrophy of thyroid: is stable tsh 1.949 will continue synthroid 50 mcg daily   5.  Type 2 diabetes mellitus with stage 3 chronic kidney disease with long term use of insulin: hgb a1c 8.3: without change: will continue metformin 500 mg twice daily humalob mix 75/25 15 units three times daily with meals  6. Dyslipidemia associated with type 2 diabetes mellitus: is stable ldl 144 trig 233; will continue crestor 5 mg every Sunday  7.  Renal disease due to diabetes mellitus type 2: stable bun 37 creat 1.31  8. Hypokalemia: is stable k+ 3.9 will continue k+ 8 meq twice daily   9. Bilateral glaucoma: stable will continue xalatan to both eyes nightly   10. Chronic generalized pain: is being managed: will continue tylenol 650 mg twice daily and ultram 100 mg every 6 hours as needed  11. Diabetic ulcer of right foot: is without change: will use santyl to ulceration; provolon boot   Will check hgb a1c    MD is aware of resident's narcotic use and is in agreement with current plan of care. We will attempt to wean resident as apropriate   Ok Edwards NP [facility]  Contact [phone] Monday through Friday 8am- 5pm  After hours call [phone]

## 2018-06-17 ENCOUNTER — Other Ambulatory Visit
Admission: RE | Admit: 2018-06-17 | Discharge: 2018-06-17 | Disposition: A | Discharge status: Home / Self Care | Payer: Medicare Other | Source: Other Acute Inpatient Hospital | Attending: Internal Medicine | Admitting: Internal Medicine

## 2018-06-17 DIAGNOSIS — E1122 Type 2 diabetes mellitus with diabetic chronic kidney disease: Secondary | ICD-10-CM | POA: Insufficient documentation

## 2018-06-17 LAB — HEMOGLOBIN A1C
HEMOGLOBIN A1C: 8 % — AB (ref 4.8–5.6)
MEAN PLASMA GLUCOSE: 182.9 mg/dL

## 2018-06-22 ENCOUNTER — Encounter: Payer: Self-pay | Admitting: Adult Health

## 2018-06-22 NOTE — Progress Notes (Signed)
Location:   [facility] Room Number: [phone] Place of Service:  SNF (31)   CODE STATUS: DNR  Allergies  Allergen Reactions  . Ace Inhibitors     Other reaction(s): Unknown Noted by [facility]  . Amlodipine     Other reaction(s): Unknown  . Norvasc [Amlodipine Besylate]     Generic Norvasc-->cough  . Simvastatin     Other reaction(s): Unknown Noted by [facility]   . Celebrex [Celecoxib] Rash    Hives, itching      Chief Complaint  Patient presents with  . Acute Visit    Altered Mental Status    HPI:    Past Medical History:  Diagnosis Date  . CAD (coronary artery disease)    status post 2 stents (Dr. Claiborne Billings, Dr. Elisabeth Cara, Dr. Fath-cardiologist) 2008; MI, 10/2008  . Colonic adenoma    unspecified  . Diabetes mellitus type 2, uncomplicated ([place])   . DM (diabetes mellitus) ([place])   . Glaucoma   . HTN (hypertension)   . Microalbuminuria    diabetic  . Osteoarthritis   . Thyroid disease     Past Surgical History:  Procedure Laterality Date  . ABDOMINAL HYSTERECTOMY    . APPENDECTOMY  1949  . CATARACT EXTRACTION     left eye  . CERVICAL LAMINECTOMY  1994  . CESAREAN SECTION     x 3  . COLONOSCOPY W/ POLYPECTOMY  11/2007   (RTE) PCI/DES - Stents x 2 sequential- LAD 12/8/200  . CORONARY ANGIOPLASTY WITH STENT PLACEMENT     x 2  . LOWER EXTREMITY ANGIOGRAPHY Left 06/14/2018   Procedure: LOWER EXTREMITY ANGIOGRAPHY;  Surgeon: Katha Cabal, MD;  Location: [facility];  Service: Cardiovascular;  Laterality: Left;  . ROTATOR CUFF REPAIR Left 1990    Social History   Socioeconomic History  . Marital status: Widowed    Spouse name: Not on file  . Number of children: 3  . Years of education: 55  . Highest education level: 11th grade  Occupational History  . Not on file  Social Needs  . Financial resource strain: Not on file  . Food insecurity:    Worry: Not on file    Inability: Not on file  . Transportation needs:    Medical:  Not on file    Non-medical: Not on file  Tobacco Use  . Smoking status: Never Smoker  . Smokeless tobacco: Never Used  Substance and Sexual Activity  . Alcohol use: No  . Drug use: No  . Sexual activity: Never  Lifestyle  . Physical activity:    Days per week: Not on file    Minutes per session: Not on file  . Stress: Not on file  Relationships  . Social connections:    Talks on phone: Not on file    Gets together: Not on file    Attends religious service: Not on file    Active member of club or organization: Not on file    Attends meetings of clubs or organizations: Not on file    Relationship status: Not on file  . Intimate partner violence:    Fear of current or ex partner: Not on file    Emotionally abused: Not on file    Physically abused: Not on file    Forced sexual activity: Not on file  Other Topics Concern  . Not on file  Social History Narrative   Lives alone. Widowed since 11/12/2011. Denies tobacco  PCP Dr. Kern Alberta [place]   Lives in [place]    Denies cigarette use    Denies alcohol use   DNR   Admitted [facility] 12/14/2016               Family History  Problem Relation Age of Onset  . Stroke Mother       VITAL SIGNS BP (!) 174/68   Pulse 83   Temp 98 F (36.7 C) (Oral)   Resp 16   Ht 5\' 4"  (1.626 m)   Wt 161 lb 3.2 oz (73.1 kg)   SpO2 97%   BMI 27.67 kg/m   Outpatient Encounter Medications as of 06/22/2018  Medication Sig  . acetaminophen (TYLENOL) 325 MG tablet Take 650 mg by mouth every 6 (six) hours as needed for moderate pain. do not exceed 3grams in 24hr period  . acetaminophen (TYLENOL) 650 MG CR tablet Take 650 mg by mouth every 12 (twelve) hours.   Marland Kitchen amLODipine (NORVASC) 10 MG tablet Take 10 mg by mouth daily.  Marland Kitchen aspirin 81 MG chewable tablet Chew 81 mg by mouth daily.  . Cholecalciferol (VITAMIN D3) 2000 units capsule Take 2,000 Units by mouth daily.   . cloNIDine (CATAPRES) 0.2 MG tablet Take 0.4  mg by mouth 2 (two) times daily. (Please let provider know when med order is due to be re-newed from pharmacy- new dose to be called in.)  . clopidogrel (PLAVIX) 75 MG tablet Take 1 tablet (75 mg total) by mouth daily.  . collagenase (SANTYL) ointment Apply 1 application topically daily. Cleanse left lateral first toe with ns, skin prep periwound, apply santyl 1/4 inch nickel thick, cover with ns gauze and allevyn.  Marland Kitchen dextrose (GLUTOSE) 40 % GEL Take 1 Tube by mouth as needed for low blood sugar. Give 1 tube every 30 minutes Until BS > 70 and pt is still responsive.  . fluticasone (FLONASE) 50 MCG/ACT nasal spray Place 1 spray into both nostrils daily.   Marland Kitchen glucagon (GLUCAGON EMERGENCY) 1 MG injection Inject 1 mg into the muscle once as needed. For hypoglycemia (symptomatic) not responding to oral glucose or snack. May repeat x 1 after 15 mins. Use only if pt is becoming unresponsive.  . Infant Care Products (DERMACLOUD) CREA Apply 1 application topically as needed (for skin irritation). Apply liberal amount topically to area of skin irritation as needed. Ok to leave at bedside   . insulin lispro protamine-lispro (HUMALOG 75/25 MIX) (75-25) 100 UNIT/ML SUSP injection Inject 15 Units into the skin 3 (three) times daily with meals.   . isosorbide mononitrate (IMDUR) 30 MG 24 hr tablet Take 30 mg by mouth daily.   Marland Kitchen latanoprost (XALATAN) 0.005 % ophthalmic solution Place 1 drop into both eyes at bedtime.   Marland Kitchen levothyroxine (SYNTHROID, LEVOTHROID) 50 MCG tablet Take 50 mcg by mouth daily before breakfast.   . Menthol-Methyl Salicylate (MUSCLE RUB) 10-15 % CREA Apply 1 application topically 2 (two) times daily as needed.  . metFORMIN (GLUCOPHAGE) 500 MG tablet Take 500 mg by mouth 2 (two) times daily with a meal.   . metoprolol succinate (TOPROL-XL) 100 MG 24 hr tablet Take 100 mg by mouth 2 (two) times daily. Take with or immediately following a meal.   . nitroGLYCERIN (NITROSTAT) 0.4 MG SL tablet Place 0.4  mg under the tongue every 5 (five) minutes as needed for chest pain.   Marland Kitchen olmesartan-hydrochlorothiazide (BENICAR HCT) 40-25 MG tablet Take 1 tablet by mouth daily.   Marland Kitchen  OXYGEN Inhale 2 L into the lungs as needed. For dyspnea ,   Check O2 saturations prior to placing on patient and at least every 4 hours after applying. Notify MD if oxygen saturations drop below baseline while receiving oxygen or no improvement in dyspnea  . Potassium Chloride CR (MICRO-K) 8 MEQ CPCR capsule CR Take 8 mEq by mouth 2 (two) times daily.  . rosuvastatin (CRESTOR) 5 MG tablet Take 5 mg by mouth every Sunday. In the evening  . senna-docusate (SENOKOT-S) 8.6-50 MG per tablet Take 1 tablet by mouth 2 (two) times daily as needed for constipation.  . traMADol (ULTRAM) 50 MG tablet Take 1 tablet (50 mg total) by mouth every 6 (six) hours as needed for moderate pain.  Marland Kitchen UNABLE TO FIND Diet Change: Please add moisture/gravy as a side on all meals. Diet Type: No Concentrated Sweets   No facility-administered encounter medications on file as of 06/22/2018.      SIGNIFICANT DIAGNOSTIC EXAMS       ASSESSMENT/ PLAN:    MD is aware of resident's narcotic use and is in agreement with current plan of care. We will attempt to wean resident as apropriate   Ok Edwards NP [facility] Adult Medicine  Contact [phone] Monday through Friday 8am- 5pm  After hours call [phone]

## 2018-06-24 ENCOUNTER — Other Ambulatory Visit
Admission: RE | Admit: 2018-06-24 | Discharge: 2018-06-24 | Disposition: A | Discharge status: Home / Self Care | Payer: Medicare Other | Source: Ambulatory Visit | Attending: Internal Medicine | Admitting: Internal Medicine

## 2018-06-24 DIAGNOSIS — R888 Abnormal findings in other body fluids and substances: Secondary | ICD-10-CM | POA: Diagnosis not present

## 2018-06-24 DIAGNOSIS — R197 Diarrhea, unspecified: Secondary | ICD-10-CM | POA: Diagnosis present

## 2018-06-24 LAB — C DIFFICILE QUICK SCREEN W PCR REFLEX
C Diff antigen: NEGATIVE
C Diff interpretation: NOT DETECTED
C Diff toxin: NEGATIVE

## 2018-06-28 NOTE — Progress Notes (Signed)
This encounter was created in error - please disregard.

## 2018-06-29 ENCOUNTER — Other Ambulatory Visit (INDEPENDENT_AMBULATORY_CARE_PROVIDER_SITE_OTHER): Payer: Self-pay | Admitting: Vascular Surgery

## 2018-06-29 DIAGNOSIS — Z9862 Peripheral vascular angioplasty status: Secondary | ICD-10-CM

## 2018-06-29 DIAGNOSIS — I70249 Atherosclerosis of native arteries of left leg with ulceration of unspecified site: Secondary | ICD-10-CM

## 2018-06-30 ENCOUNTER — Ambulatory Visit (INDEPENDENT_AMBULATORY_CARE_PROVIDER_SITE_OTHER): Payer: Medicare Other | Admitting: Vascular Surgery

## 2018-06-30 ENCOUNTER — Encounter (INDEPENDENT_AMBULATORY_CARE_PROVIDER_SITE_OTHER): Payer: Self-pay

## 2018-06-30 ENCOUNTER — Ambulatory Visit (INDEPENDENT_AMBULATORY_CARE_PROVIDER_SITE_OTHER): Payer: Medicare Other

## 2018-06-30 ENCOUNTER — Encounter (INDEPENDENT_AMBULATORY_CARE_PROVIDER_SITE_OTHER): Payer: Self-pay | Admitting: Vascular Surgery

## 2018-06-30 VITALS — BP 137/66 | HR 70 | Resp 14 | Ht 64.0 in | Wt 160.0 lb

## 2018-06-30 DIAGNOSIS — I25119 Atherosclerotic heart disease of native coronary artery with unspecified angina pectoris: Secondary | ICD-10-CM | POA: Diagnosis not present

## 2018-06-30 DIAGNOSIS — E1122 Type 2 diabetes mellitus with diabetic chronic kidney disease: Secondary | ICD-10-CM | POA: Diagnosis not present

## 2018-06-30 DIAGNOSIS — N183 Chronic kidney disease, stage 3 (moderate): Secondary | ICD-10-CM

## 2018-06-30 DIAGNOSIS — I7025 Atherosclerosis of native arteries of other extremities with ulceration: Secondary | ICD-10-CM

## 2018-06-30 DIAGNOSIS — Z794 Long term (current) use of insulin: Secondary | ICD-10-CM

## 2018-06-30 DIAGNOSIS — Z9862 Peripheral vascular angioplasty status: Secondary | ICD-10-CM | POA: Diagnosis not present

## 2018-06-30 DIAGNOSIS — I70249 Atherosclerosis of native arteries of left leg with ulceration of unspecified site: Secondary | ICD-10-CM | POA: Diagnosis not present

## 2018-06-30 DIAGNOSIS — I10 Essential (primary) hypertension: Secondary | ICD-10-CM | POA: Diagnosis not present

## 2018-06-30 DIAGNOSIS — E785 Hyperlipidemia, unspecified: Secondary | ICD-10-CM

## 2018-06-30 NOTE — Progress Notes (Signed)
MRN : [id number]  Jody Dorsey is a 84 y.o. (1930-07-13) female who presents with chief complaint of No chief complaint on file. Marland Kitchen  History of Present Illness: The patient returns to the office for followup and review status post angiogram with intervention.   Procedure performed on 06/14/2018             1. Introduction catheter into left lower extremity 3rd order catheter placement  2. Contrast injection left lower extremity for distal runoff  3. Percutaneous transluminal angioplasty left popliteal arteries to 5 mm with Lutonix drug-eluting balloon 4. Percutaneous transluminal angioplasty left posterior tibial beginning with a 2 mm balloon distally and extending up to a 4 mm balloon in the tibioperoneal trunk  5. Star close closure right common femoral arteriotomy  The patient notes no improvement in the lower extremity symptoms. She continues to have rest pain symptoms. Previous wounds are not improved.  No new ulcers or wounds have occurred since the last visit.  There have been no significant changes to the patient's overall health care.  The patient denies amaurosis fugax or recent TIA symptoms. There are no recent neurological changes noted. The patient denies history of DVT, PE or superficial thrombophlebitis. The patient denies recent episodes of angina or shortness of breath.   ABI's Rt=0.67 and Lt=0.2    No outpatient medications have been marked as taking for the 06/30/18 encounter (Appointment) with Delana Meyer, Dolores Lory, MD.    Past Medical History:  Diagnosis Date  . CAD (coronary artery disease)    status post 2 stents (Dr. Claiborne Billings, Dr. Elisabeth Cara, Dr. Fath-cardiologist) 2008; MI, 10/2008  . Colonic adenoma    unspecified  . Diabetes mellitus type 2, uncomplicated ([place])   . DM (diabetes mellitus) ([place])   . Glaucoma   . HTN (hypertension)   . Microalbuminuria    diabetic  . Osteoarthritis   . Thyroid disease      Past Surgical History:  Procedure Laterality Date  . ABDOMINAL HYSTERECTOMY    . APPENDECTOMY  1949  . CATARACT EXTRACTION     left eye  . CERVICAL LAMINECTOMY  1994  . CESAREAN SECTION     x 3  . COLONOSCOPY W/ POLYPECTOMY  11/2007   (RTE) PCI/DES - Stents x 2 sequential- LAD 12/8/200  . CORONARY ANGIOPLASTY WITH STENT PLACEMENT     x 2  . LOWER EXTREMITY ANGIOGRAPHY Left 06/14/2018   Procedure: LOWER EXTREMITY ANGIOGRAPHY;  Surgeon: Katha Cabal, MD;  Location: [facility];  Service: Cardiovascular;  Laterality: Left;  . ROTATOR CUFF REPAIR Left 1990    Social History Social History   Tobacco Use  . Smoking status: Never Smoker  . Smokeless tobacco: Never Used  Substance Use Topics  . Alcohol use: No  . Drug use: No    Family History Family History  Problem Relation Age of Onset  . Stroke Mother     Allergies  Allergen Reactions  . Ace Inhibitors     Other reaction(s): Unknown Noted by [facility]  . Amlodipine     Other reaction(s): Unknown  . Norvasc [Amlodipine Besylate]     Generic Norvasc-->cough  . Simvastatin     Other reaction(s): Unknown Noted by [facility]   . Celebrex [Celecoxib] Rash    Hives, itching       REVIEW OF SYSTEMS (Negative unless checked)  Constitutional: [] Weight loss  [] Fever  [] Chills Cardiac: [] Chest pain   [] Chest pressure   [] Palpitations   [] Shortness  of breath when laying flat   [] Shortness of breath with exertion. Vascular:  [x] Pain in legs with walking   [x] Pain in legs at rest  [] History of DVT   [] Phlebitis   [] Swelling in legs   [] Varicose veins   [x] Non-healing ulcers Pulmonary:   [] Uses home oxygen   [] Productive cough   [] Hemoptysis   [] Wheeze  [] COPD   [] Asthma Neurologic:  [] Dizziness   [] Seizures   [] History of stroke   [] History of TIA  [] Aphasia   [] Vissual changes   [] Weakness or numbness in arm   [] Weakness or numbness in leg Musculoskeletal:   [] Joint swelling   [] Joint pain   [] Low back  pain Hematologic:  [] Easy bruising  [] Easy bleeding   [] Hypercoagulable state   [] Anemic Gastrointestinal:  [] Diarrhea   [] Vomiting  [] Gastroesophageal reflux/heartburn   [] Difficulty swallowing. Genitourinary:  [] Chronic kidney disease   [] Difficult urination  [] Frequent urination   [] Blood in urine Skin:  [] Rashes   [x] Ulcers  Psychological:  [] History of anxiety   []  History of major depression.  Physical Examination  There were no vitals filed for this visit. There is no height or weight on file to calculate BMI. Gen: WD/WN, NAD Head: [place]/AT, No temporalis wasting.  Ear/Nose/Throat: Hearing grossly intact, nares w/o erythema or drainage Eyes: PER, EOMI, sclera nonicteric.  Neck: Supple, no large masses.   Pulmonary:  Good air movement, no audible wheezing bilaterally, no use of accessory muscles.  Cardiac: RRR, no JVD Vascular: left ulcer unchanged Vessel Right Left  Radial Palpable Palpable  PT Not Palpable Not Palpable  DP Not Palpable Not Palpable  Gastrointestinal: Non-distended. No guarding/no peritoneal signs.  Musculoskeletal: M/S 5/5 throughout.  No deformity or atrophy.  Neurologic: CN 2-12 intact. Symmetrical.  Speech is fluent. Motor exam as listed above. Psychiatric: Judgment intact, Mood & affect appropriate for pt's clinical situation. Dermatologic: No rashes + ulcers noted.  No changes consistent with cellulitis. Lymph : No lichenification or skin changes of chronic lymphedema.  CBC Lab Results  Component Value Date   WBC 7.5 06/15/2018   HGB 12.2 06/15/2018   HCT 35.7 06/15/2018   MCV 93.2 06/15/2018   PLT 186 06/15/2018    BMET    Component Value Date/Time   NA 136 03/25/2018 1135   NA 137 11/30/2012 1710   K 3.9 03/25/2018 1135   K 3.8 04/25/2013 1030   CL 102 03/25/2018 1135   CL 103 11/30/2012 1710   CO2 24 03/25/2018 1135   CO2 27 11/30/2012 1710   GLUCOSE 153 (H) 03/25/2018 1135   GLUCOSE 196 (H) 11/30/2012 1710   BUN 28 (H) 06/14/2018  1113   BUN 25 (H) 11/30/2012 1710   CREATININE 1.14 (H) 06/14/2018 1113   CREATININE 0.97 11/30/2012 1710   CALCIUM 9.1 03/25/2018 1135   CALCIUM 9.0 11/30/2012 1710   GFRNONAA 43 (L) 06/14/2018 1113   GFRNONAA 55 (L) 11/30/2012 1710   GFRAA 49 (L) 06/14/2018 1113   GFRAA >60 11/30/2012 1710   Estimated Creatinine Clearance: 35.4 mL/min (A) (by C-G formula based on SCr of 1.14 mg/dL (H)).  COAG Lab Results  Component Value Date   INR 0.94 06/14/2018   INR 0.96 11/30/2012   INR 1.0 11/30/2012    Radiology No results found.   Assessment/Plan 1. Atherosclerosis of native arteries of the extremities with ulceration ([place])  Recommend:  The patient has evidence of severe atherosclerotic changes of both lower extremities associated with ulceration and tissue loss of the foot.  This represents a limb threatening ischemia and places the patient at the risk for limb loss.  Will plan left pedal access and have atherectomy available.  Patient should undergo angiography of the lower extremities with the hope for intervention for limb salvage.  The risks and benefits as well as the alternative therapies was discussed in detail with the patient.  All questions were answered.  Patient agrees to proceed with angiography.  The patient will follow up with me in the office after the procedure.    2. Atherosclerosis of native coronary artery of native heart with angina pectoris ([place]) Continue cardiac and antihypertensive medications as already ordered and reviewed, no changes at this time.  Continue statin as ordered and reviewed, no changes at this time  Nitrates PRN for chest pain   3. Essential (primary) hypertension Continue antihypertensive medications as already ordered, these medications have been reviewed and there are no changes at this time.   4. Type 2 diabetes mellitus with stage 3 chronic kidney disease, with long-term current use of insulin (HCC) Continue hypoglycemic  medications as already ordered, these medications have been reviewed and there are no changes at this time.  Hgb A1C to be monitored as already arranged by primary service   5. Hyperlipidemia, unspecified hyperlipidemia type Continue statin as ordered and reviewed, no changes at this time     Hortencia Pilar, MD  06/30/2018 8:45 AM

## 2018-07-01 ENCOUNTER — Other Ambulatory Visit (INDEPENDENT_AMBULATORY_CARE_PROVIDER_SITE_OTHER): Payer: Self-pay | Admitting: Nurse Practitioner

## 2018-07-10 ENCOUNTER — Encounter
Admission: RE | Admit: 2018-07-10 | Discharge: 2018-07-10 | Disposition: A | Discharge status: Home / Self Care | Payer: Medicare Other | Source: Ambulatory Visit | Attending: Internal Medicine | Admitting: Internal Medicine

## 2018-07-11 MED ORDER — DEXTROSE 5 % IV SOLN
2.0000 g | Freq: Once | INTRAVENOUS | Status: AC
  Administered 2018-07-12: 2 g via INTRAVENOUS
  Filled 2018-07-11: qty 20

## 2018-07-12 ENCOUNTER — Observation Stay
Admission: RE | Admit: 2018-07-12 | Discharge: 2018-07-13 | Disposition: A | Discharge status: Skilled Nursing Facility | Payer: Medicare Other | Source: Ambulatory Visit | Attending: Vascular Surgery | Admitting: Vascular Surgery

## 2018-07-12 ENCOUNTER — Encounter
Admission: RE | Disposition: A | Discharge status: Skilled Nursing Facility | Payer: Self-pay | Source: Ambulatory Visit | Attending: Vascular Surgery

## 2018-07-12 ENCOUNTER — Other Ambulatory Visit: Payer: Self-pay

## 2018-07-12 ENCOUNTER — Encounter: Payer: Self-pay | Admitting: *Deleted

## 2018-07-12 DIAGNOSIS — I129 Hypertensive chronic kidney disease with stage 1 through stage 4 chronic kidney disease, or unspecified chronic kidney disease: Secondary | ICD-10-CM | POA: Insufficient documentation

## 2018-07-12 DIAGNOSIS — E1151 Type 2 diabetes mellitus with diabetic peripheral angiopathy without gangrene: Principal | ICD-10-CM | POA: Insufficient documentation

## 2018-07-12 DIAGNOSIS — I70248 Atherosclerosis of native arteries of left leg with ulceration of other part of lower left leg: Secondary | ICD-10-CM

## 2018-07-12 DIAGNOSIS — M1990 Unspecified osteoarthritis, unspecified site: Secondary | ICD-10-CM | POA: Insufficient documentation

## 2018-07-12 DIAGNOSIS — I2510 Atherosclerotic heart disease of native coronary artery without angina pectoris: Secondary | ICD-10-CM | POA: Diagnosis not present

## 2018-07-12 DIAGNOSIS — E1122 Type 2 diabetes mellitus with diabetic chronic kidney disease: Secondary | ICD-10-CM | POA: Insufficient documentation

## 2018-07-12 DIAGNOSIS — E11621 Type 2 diabetes mellitus with foot ulcer: Secondary | ICD-10-CM | POA: Insufficient documentation

## 2018-07-12 DIAGNOSIS — Z7982 Long term (current) use of aspirin: Secondary | ICD-10-CM | POA: Diagnosis not present

## 2018-07-12 DIAGNOSIS — I252 Old myocardial infarction: Secondary | ICD-10-CM | POA: Insufficient documentation

## 2018-07-12 DIAGNOSIS — I70245 Atherosclerosis of native arteries of left leg with ulceration of other part of foot: Secondary | ICD-10-CM | POA: Diagnosis not present

## 2018-07-12 DIAGNOSIS — Z955 Presence of coronary angioplasty implant and graft: Secondary | ICD-10-CM | POA: Diagnosis not present

## 2018-07-12 DIAGNOSIS — L97909 Non-pressure chronic ulcer of unspecified part of unspecified lower leg with unspecified severity: Secondary | ICD-10-CM

## 2018-07-12 DIAGNOSIS — L97529 Non-pressure chronic ulcer of other part of left foot with unspecified severity: Secondary | ICD-10-CM | POA: Diagnosis not present

## 2018-07-12 DIAGNOSIS — Z794 Long term (current) use of insulin: Secondary | ICD-10-CM | POA: Diagnosis not present

## 2018-07-12 DIAGNOSIS — F0390 Unspecified dementia without behavioral disturbance: Secondary | ICD-10-CM | POA: Diagnosis not present

## 2018-07-12 DIAGNOSIS — E039 Hypothyroidism, unspecified: Secondary | ICD-10-CM | POA: Diagnosis not present

## 2018-07-12 DIAGNOSIS — Z7902 Long term (current) use of antithrombotics/antiplatelets: Secondary | ICD-10-CM | POA: Insufficient documentation

## 2018-07-12 DIAGNOSIS — H409 Unspecified glaucoma: Secondary | ICD-10-CM | POA: Diagnosis not present

## 2018-07-12 DIAGNOSIS — N189 Chronic kidney disease, unspecified: Secondary | ICD-10-CM | POA: Insufficient documentation

## 2018-07-12 DIAGNOSIS — I70299 Other atherosclerosis of native arteries of extremities, unspecified extremity: Secondary | ICD-10-CM

## 2018-07-12 DIAGNOSIS — Z7951 Long term (current) use of inhaled steroids: Secondary | ICD-10-CM | POA: Diagnosis not present

## 2018-07-12 DIAGNOSIS — I70269 Atherosclerosis of native arteries of extremities with gangrene, unspecified extremity: Secondary | ICD-10-CM | POA: Diagnosis present

## 2018-07-12 HISTORY — DX: Dysphagia, unspecified: R13.10

## 2018-07-12 HISTORY — DX: Chronic kidney disease, unspecified: N18.9

## 2018-07-12 HISTORY — PX: LOWER EXTREMITY ANGIOGRAPHY: CATH118251

## 2018-07-12 HISTORY — DX: Hypothyroidism, unspecified: E03.9

## 2018-07-12 HISTORY — DX: Bronchitis, not specified as acute or chronic: J40

## 2018-07-12 HISTORY — DX: Unspecified dementia, unspecified severity, without behavioral disturbance, psychotic disturbance, mood disturbance, and anxiety: F03.90

## 2018-07-12 LAB — GLUCOSE, CAPILLARY
GLUCOSE-CAPILLARY: 328 mg/dL — AB (ref 70–99)
Glucose-Capillary: 252 mg/dL — ABNORMAL HIGH (ref 70–99)
Glucose-Capillary: 234 mg/dL — ABNORMAL HIGH (ref 70–99)

## 2018-07-12 LAB — CREATININE, SERUM
Creatinine, Ser: 1.39 mg/dL — ABNORMAL HIGH (ref 0.44–1.00)
GFR calc Af Amer: 39 mL/min — ABNORMAL LOW (ref >60)
GFR calc non Af Amer: 34 mL/min — ABNORMAL LOW (ref >60)

## 2018-07-12 LAB — BUN: BUN: 32 mg/dL — ABNORMAL HIGH (ref 8–23)

## 2018-07-12 SURGERY — LOWER EXTREMITY ANGIOGRAPHY
Anesthesia: Moderate Sedation | Laterality: Left

## 2018-07-12 MED ORDER — CLOPIDOGREL BISULFATE 75 MG PO TABS
75.0000 mg | ORAL_TABLET | Freq: Every day | ORAL | Status: DC
  Administered 2018-07-13: 75 mg via ORAL
  Filled 2018-07-12: qty 1

## 2018-07-12 MED ORDER — ASPIRIN 81 MG PO CHEW
81.0000 mg | CHEWABLE_TABLET | Freq: Every day | ORAL | Status: DC
  Administered 2018-07-13: 81 mg via ORAL
  Filled 2018-07-12: qty 1

## 2018-07-12 MED ORDER — HYDROMORPHONE HCL 1 MG/ML IJ SOLN: 1.0000 mg | Freq: Once | INTRAMUSCULAR | Status: DC | PRN

## 2018-07-12 MED ORDER — SODIUM CHLORIDE 0.9 % IV SOLN: 250.0000 mL | INTRAVENOUS | Status: DC | PRN

## 2018-07-12 MED ORDER — SODIUM CHLORIDE 0.9 % IV SOLN
INTRAVENOUS | Status: DC
  Administered 2018-07-12: 13:00:00 via INTRAVENOUS

## 2018-07-12 MED ORDER — LEVOTHYROXINE SODIUM 50 MCG PO TABS
50.0000 ug | ORAL_TABLET | Freq: Every day | ORAL | Status: DC
  Administered 2018-07-13: 50 ug via ORAL
  Filled 2018-07-12: qty 1

## 2018-07-12 MED ORDER — OLMESARTAN MEDOXOMIL-HCTZ 40-25 MG PO TABS: 1.0000 | ORAL_TABLET | Freq: Every day | ORAL | Status: DC

## 2018-07-12 MED ORDER — LIDOCAINE-EPINEPHRINE (PF) 1 %-1:200000 IJ SOLN
INTRAMUSCULAR | Status: AC
  Filled 2018-07-12: qty 30

## 2018-07-12 MED ORDER — FENTANYL CITRATE (PF) 100 MCG/2ML IJ SOLN
INTRAMUSCULAR | Status: DC | PRN
  Administered 2018-07-12 (×2): 50 ug via INTRAVENOUS

## 2018-07-12 MED ORDER — VITAMIN D 1000 UNITS PO TABS
2000.0000 [IU] | ORAL_TABLET | Freq: Every day | ORAL | Status: DC
  Administered 2018-07-13: 2000 [IU] via ORAL
  Filled 2018-07-12 (×3): qty 2

## 2018-07-12 MED ORDER — HEPARIN (PORCINE) IN NACL 1000-0.9 UT/500ML-% IV SOLN
INTRAVENOUS | Status: AC
  Filled 2018-07-12: qty 1000

## 2018-07-12 MED ORDER — DERMACLOUD EX CREA: 1.0000 "application " | TOPICAL_CREAM | CUTANEOUS | Status: DC | PRN

## 2018-07-12 MED ORDER — HEPARIN SODIUM (PORCINE) 1000 UNIT/ML IJ SOLN
INTRAMUSCULAR | Status: AC
  Filled 2018-07-12: qty 1

## 2018-07-12 MED ORDER — HEPARIN SODIUM (PORCINE) 1000 UNIT/ML IJ SOLN
INTRAMUSCULAR | Status: DC | PRN
  Administered 2018-07-12: 5000 [IU] via INTRAVENOUS

## 2018-07-12 MED ORDER — SENNOSIDES-DOCUSATE SODIUM 8.6-50 MG PO TABS
1.0000 | ORAL_TABLET | Freq: Two times a day (BID) | ORAL | Status: DC | PRN
  Filled 2018-07-12: qty 1

## 2018-07-12 MED ORDER — FLUTICASONE PROPIONATE 50 MCG/ACT NA SUSP
1.0000 | Freq: Every day | NASAL | Status: DC
  Administered 2018-07-13: 1 via NASAL
  Filled 2018-07-12: qty 16

## 2018-07-12 MED ORDER — CLONIDINE HCL 0.1 MG PO TABS
0.3000 mg | ORAL_TABLET | Freq: Two times a day (BID) | ORAL | Status: DC
  Administered 2018-07-12 – 2018-07-13 (×2): 0.3 mg via ORAL
  Filled 2018-07-12 (×3): qty 3

## 2018-07-12 MED ORDER — AMLODIPINE BESYLATE 5 MG PO TABS
10.0000 mg | ORAL_TABLET | Freq: Every day | ORAL | Status: DC
  Administered 2018-07-13: 10 mg via ORAL
  Filled 2018-07-12: qty 2

## 2018-07-12 MED ORDER — NITROGLYCERIN 5 MG/ML IV SOLN
INTRAVENOUS | Status: AC
  Filled 2018-07-12: qty 10

## 2018-07-12 MED ORDER — ONDANSETRON HCL 4 MG/2ML IJ SOLN: 4.0000 mg | Freq: Four times a day (QID) | INTRAMUSCULAR | Status: DC | PRN

## 2018-07-12 MED ORDER — COLLAGENASE 250 UNIT/GM EX OINT
1.0000 "application " | TOPICAL_OINTMENT | Freq: Every day | CUTANEOUS | Status: DC
  Administered 2018-07-13: 1 via TOPICAL
  Filled 2018-07-12: qty 30

## 2018-07-12 MED ORDER — OXYCODONE HCL 5 MG PO TABS: 5.0000 mg | ORAL_TABLET | ORAL | Status: DC | PRN

## 2018-07-12 MED ORDER — ROSUVASTATIN CALCIUM 5 MG PO TABS: 5.0000 mg | ORAL_TABLET | ORAL | Status: DC

## 2018-07-12 MED ORDER — MIDAZOLAM HCL 2 MG/2ML IJ SOLN
INTRAMUSCULAR | Status: DC | PRN
  Administered 2018-07-12 (×2): 1 mg via INTRAVENOUS

## 2018-07-12 MED ORDER — TRAMADOL HCL 50 MG PO TABS: 50.0000 mg | ORAL_TABLET | Freq: Four times a day (QID) | ORAL | Status: DC | PRN

## 2018-07-12 MED ORDER — HEPARIN (PORCINE) IN NACL 100-0.45 UNIT/ML-% IJ SOLN
INTRAMUSCULAR | Status: AC
  Filled 2018-07-12: qty 250

## 2018-07-12 MED ORDER — GLUCOSE 40 % PO GEL
1.0000 | ORAL | Status: DC | PRN
  Filled 2018-07-12: qty 1

## 2018-07-12 MED ORDER — LABETALOL HCL 5 MG/ML IV SOLN
INTRAVENOUS | Status: AC
  Filled 2018-07-12: qty 4

## 2018-07-12 MED ORDER — HEPARIN (PORCINE) IN NACL 100-0.45 UNIT/ML-% IJ SOLN
1100.0000 [IU]/h | INTRAMUSCULAR | Status: DC
  Administered 2018-07-12 (×2): 950 [IU]/h via INTRAVENOUS

## 2018-07-12 MED ORDER — IRBESARTAN 150 MG PO TABS
300.0000 mg | ORAL_TABLET | Freq: Every day | ORAL | Status: DC
  Administered 2018-07-13: 300 mg via ORAL
  Filled 2018-07-12: qty 2

## 2018-07-12 MED ORDER — INSULIN ASPART 100 UNIT/ML ~~LOC~~ SOLN
0.0000 [IU] | Freq: Three times a day (TID) | SUBCUTANEOUS | Status: DC
  Administered 2018-07-13 (×3): 5 [IU] via SUBCUTANEOUS
  Filled 2018-07-12 (×2): qty 1

## 2018-07-12 MED ORDER — METFORMIN HCL 500 MG PO TABS
500.0000 mg | ORAL_TABLET | Freq: Two times a day (BID) | ORAL | Status: DC
  Administered 2018-07-12: 500 mg via ORAL
  Filled 2018-07-12 (×3): qty 1

## 2018-07-12 MED ORDER — NITROGLYCERIN 0.4 MG SL SUBL: 0.4000 mg | SUBLINGUAL_TABLET | SUBLINGUAL | Status: DC | PRN

## 2018-07-12 MED ORDER — MIDAZOLAM HCL 5 MG/5ML IJ SOLN
INTRAMUSCULAR | Status: AC
  Filled 2018-07-12: qty 5

## 2018-07-12 MED ORDER — IOPAMIDOL (ISOVUE-300) INJECTION 61%
INTRAVENOUS | Status: DC | PRN
  Administered 2018-07-12: 35 mL via INTRA_ARTERIAL

## 2018-07-12 MED ORDER — HYDROCHLOROTHIAZIDE 25 MG PO TABS
25.0000 mg | ORAL_TABLET | Freq: Every day | ORAL | Status: DC
  Administered 2018-07-13: 25 mg via ORAL
  Filled 2018-07-12: qty 1

## 2018-07-12 MED ORDER — MORPHINE SULFATE (PF) 4 MG/ML IV SOLN: 2.0000 mg | INTRAVENOUS | Status: DC | PRN

## 2018-07-12 MED ORDER — INSULIN ASPART 100 UNIT/ML ~~LOC~~ SOLN
0.0000 [IU] | Freq: Every day | SUBCUTANEOUS | Status: DC
  Administered 2018-07-12: 4 [IU] via SUBCUTANEOUS
  Filled 2018-07-12: qty 1

## 2018-07-12 MED ORDER — FENTANYL CITRATE (PF) 100 MCG/2ML IJ SOLN
INTRAMUSCULAR | Status: AC
  Filled 2018-07-12: qty 2

## 2018-07-12 MED ORDER — SODIUM CHLORIDE 0.9% FLUSH: 3.0000 mL | Freq: Two times a day (BID) | INTRAVENOUS | Status: DC

## 2018-07-12 MED ORDER — SODIUM CHLORIDE 0.9% FLUSH: 3.0000 mL | INTRAVENOUS | Status: DC | PRN

## 2018-07-12 MED ORDER — METOPROLOL SUCCINATE ER 50 MG PO TB24
100.0000 mg | ORAL_TABLET | Freq: Two times a day (BID) | ORAL | Status: DC
  Administered 2018-07-12 – 2018-07-13 (×2): 100 mg via ORAL
  Filled 2018-07-12 (×2): qty 2

## 2018-07-12 MED ORDER — LABETALOL HCL 5 MG/ML IV SOLN
INTRAVENOUS | Status: DC | PRN
  Administered 2018-07-12: 10 mg via INTRAVENOUS

## 2018-07-12 MED ORDER — ISOSORBIDE MONONITRATE ER 30 MG PO TB24
30.0000 mg | ORAL_TABLET | Freq: Every day | ORAL | Status: DC
  Administered 2018-07-13: 30 mg via ORAL
  Filled 2018-07-12: qty 1

## 2018-07-12 MED ORDER — LATANOPROST 0.005 % OP SOLN
1.0000 [drp] | Freq: Every day | OPHTHALMIC | Status: DC
  Administered 2018-07-12: 1 [drp] via OPHTHALMIC
  Filled 2018-07-12: qty 2.5

## 2018-07-12 MED ORDER — SODIUM CHLORIDE 0.9 % IV SOLN
INTRAVENOUS | Status: AC
  Administered 2018-07-12: 18:00:00 via INTRAVENOUS

## 2018-07-12 MED ORDER — LIDOCAINE HCL (PF) 1 % IJ SOLN
INTRAMUSCULAR | Status: AC
  Filled 2018-07-12: qty 30

## 2018-07-12 SURGICAL SUPPLY — 14 items
CATH BEACON 5 .035 65 RIM TIP (CATHETERS) ×3 IMPLANT
CATH CXI SUPP ST 2.6FR 150CM (CATHETERS) ×3 IMPLANT
CATH SEEKER .035X150CM (CATHETERS) ×3 IMPLANT
DEVICE TORQUE .014-.018 (MISCELLANEOUS) ×1 IMPLANT
DEVICE TORQUE .025-.038 (MISCELLANEOUS) ×3 IMPLANT
GUIDEWIRE PFTE-COATED .018X300 (WIRE) ×3 IMPLANT
NEEDLE ENTRY 21GA 7CM ECHOTIP (NEEDLE) ×3 IMPLANT
PACK ANGIOGRAPHY (CUSTOM PROCEDURE TRAY) ×3 IMPLANT
SET INTRO CAPELLA COAXIAL (SET/KITS/TRAYS/PACK) ×3 IMPLANT
SHEATH BRITE TIP 5FRX11 (SHEATH) ×3 IMPLANT
SHEATH PINNACLE ST 6F 45CM (SHEATH) ×3 IMPLANT
TORQUE DEVICE .014-.018 (MISCELLANEOUS) ×3
WIRE AQUATRACK .035X260CM (WIRE) ×3 IMPLANT
WIRE G V18X300CM (WIRE) ×3 IMPLANT

## 2018-07-12 NOTE — Progress Notes (Signed)
ANTICOAGULATION CONSULT NOTE  Pharmacy Consult for heparin Indication: S/P Left lower extremity angiography with catheter placement   Allergies  Allergen Reactions  . Ace Inhibitors Other (See Comments)    Noted by [facility]  . Amlodipine Other (See Comments)    Unknown  . Norvasc [Amlodipine Besylate] Cough  . Simvastatin Other (See Comments)    Noted by [facility]   . Celebrex [Celecoxib] Hives, Itching and Rash    Patient Measurements: Height: 5\' 4"  (162.6 cm) Weight: 159 lb 13.3 oz (72.5 kg) IBW/kg (Calculated) : 54.7 Heparin Dosing Weight: 69 kg  Vital Signs: Temp: 97.8 F (36.6 C) (09/03 1301) Temp Source: Oral (09/03 1301) BP: 191/97 (09/03 1301) Pulse Rate: 106 (09/03 1301)  Labs: Recent Labs    07/12/18 1301  CREATININE 1.39*    Estimated Creatinine Clearance: 28.9 mL/min (A) (by C-G formula based on SCr of 1.39 mg/dL (H)).   Medical History: Past Medical History:  Diagnosis Date  . Bronchitis   . CAD (coronary artery disease)    status post 2 stents (Dr. Claiborne Billings, Dr. Elisabeth Cara, Dr. Fath-cardiologist) 2008; MI, 10/2008  . Chronic kidney disease   . Colonic adenoma    unspecified  . Dementia   . Diabetes mellitus type 2, uncomplicated ([place])   . DM (diabetes mellitus) ([place])   . Dysphagia   . Glaucoma   . HTN (hypertension)   . Hypothyroidism   . Microalbuminuria    diabetic  . Osteoarthritis   . Thyroid disease     Assessment: Patient has just undergone a Left lower extremity angiography with a catheter placement by vascular surgery. She received a bolus of 5000 units in the cath lab. As ordered by Dr Delana Meyer, heparin drip will begin 2 hours after sheath removal. She was here in early August and received IV heparin on that admission  Goal of Therapy:  Heparin level 0.3-0.7 units/ml Monitor platelets by anticoagulation protocol: Yes   Plan:  Start heparin infusion at 950 units/hr based on previous dosing in early August Check anti-Xa level in 8 hours and  daily while on heparin Continue to monitor H&H and platelets  Dallie Piles, PharmD 07/12/2018,3:46 PM

## 2018-07-12 NOTE — Op Note (Signed)
[facility]  Percutaneous Study/Intervention Procedural Note   Date of Surgery: 07/12/2018,3:37 PM  Surgeon:Jireh Elmore, Dolores Lory   Pre-operative Diagnosis: Atherosclerotic occlusive disease bilateral lower extremity with ulceration left foot  Post-operative diagnosis:  Same  Procedure(s) Performed:  1.  Left lower extremity angiography third order catheter placement with 2 additional third order catheter placements    Anesthesia: Conscious sedation was administered by the interventional radiology RN under my direct supervision. IV Versed plus fentanyl were utilized. Continuous ECG, pulse oximetry and blood pressure was monitored throughout the entire procedure.  Conscious sedation was administered for a total of 37 minutes.  Sheath: 6 [place] destination  Contrast: 35 cc   Fluoroscopy Time: 16.4 minutes  Indications:  The patient [facility] with ulcers of the left foot.  Pedal pulses are nonpalpable bilaterally suggesting atherosclerotic occlusive disease.  The risks and benefits as well as alternative therapies for lower extremity revascularization are reviewed with the patient all questions are answered the patient agrees to proceed.  The patient is therefore undergoing angiography with the hope for intervention for limb salvage.   Procedure:  Jody Dorsey a 84 y.o. female who was identified and appropriate procedural time out was performed.  The patient was then placed supine on the table and prepped and draped in the usual sterile fashion.  Ultrasound was used to evaluate the right common femoral artery.  It was echolucent and pulsatile indicating it is patent .  An ultrasound image was acquired for the permanent record.  A micropuncture needle was used to access the right common femoral artery under direct ultrasound guidance.  The microwire was then advanced under fluoroscopic guidance without difficulty followed by the micro-sheath.  A  0.035 J wire was advanced without resistance and a 5Fr sheath was placed.    A rim catheter in association with an aqua track wire was then used to cross the aortic bifurcation and the catheter advanced down to the distal left external iliac artery.  An LAO of the left groin was then obtained. Wire was reintroduced and negotiated into the SFA and the catheter was advanced into the SFA. Distal runoff was then performed.  Using a combination of the aqua track wire and a seeker catheter as well as a 0.018 advantage wire and a CXI catheter the catheter and wire combination were negotiated down into the posterior tibial.  Unfortunately at the level of the medial and lateral plantars true lumen could not be reentered.  Attempts at accessing the plantar vessels percutaneously were unsuccessful.  Attempts at accessing the dorsalis pedis percutaneously were unsuccessful.  The CXI and the advantage wire were then pulled back into the popliteal magnified imaging obtained and the CXI and eventually wire were advanced into the anterior tibial.  Magnified imaging of the proximal anterior tibial was obtained.  Similar to my finding in the posterior tibial the true lumen of the anterior tibial could not be reentered distally.  As a last attempt the catheter and wire were negotiated into the peroneal and again hand-injection demonstrated proximal anatomy but distally I was not able to ever get the wire and catheter to reentered the true lumen.  Of note the common femoral profunda femoris superficial femoral and popliteal arteries remain widely patent and the previous popliteal angioplasty still shows less than 10% residual stenosis.  After review of the images the catheter was removed over wire and sheath removed pressure was held.  Findings:   Left Lower Extremity:  The common femoral profunda femoris superficial femoral and popliteal arteries remain widely patent and the previous popliteal angioplasty still shows less  than 10% residual stenosis.  All 3 tibial vessels are occluded and there distal two thirds there is reconstitution of a diseased plantar system as well as a disease dorsalis pedis.  There is no identifiable filling of the pedal arch.   Disposition: Patient was taken to the recovery room in stable condition having tolerated the procedure well.  Jody Dorsey 07/12/2018,3:37 PM

## 2018-07-12 NOTE — H&P (Signed)
[facility] History & Physical Update  The patient was interviewed and re-examined.  The patient's previous History and Physical has been reviewed and is unchanged.  There is no change in the plan of care. We plan to proceed with the scheduled procedure.  Hortencia Pilar, MD  07/12/2018, 1:22 PM

## 2018-07-13 ENCOUNTER — Encounter: Payer: Self-pay | Admitting: Vascular Surgery

## 2018-07-13 DIAGNOSIS — E1151 Type 2 diabetes mellitus with diabetic peripheral angiopathy without gangrene: Secondary | ICD-10-CM | POA: Diagnosis not present

## 2018-07-13 DIAGNOSIS — I70248 Atherosclerosis of native arteries of left leg with ulceration of other part of lower left leg: Secondary | ICD-10-CM | POA: Diagnosis not present

## 2018-07-13 LAB — CBC
HCT: 28.9 % — ABNORMAL LOW (ref 35.0–47.0)
Hemoglobin: 9.5 g/dL — ABNORMAL LOW (ref 12.0–16.0)
MCH: 30.2 pg (ref 26.0–34.0)
MCHC: 33 g/dL (ref 32.0–36.0)
MCV: 91.4 fL (ref 80.0–100.0)
PLATELETS: 294 10*3/uL (ref 150–440)
RBC: 3.16 MIL/uL — AB (ref 3.80–5.20)
RDW: 14.2 % (ref 11.5–14.5)
WBC: 7.5 10*3/uL (ref 3.6–11.0)

## 2018-07-13 LAB — GLUCOSE, CAPILLARY
GLUCOSE-CAPILLARY: 255 mg/dL — AB (ref 70–99)
Glucose-Capillary: 297 mg/dL — ABNORMAL HIGH (ref 70–99)
Glucose-Capillary: 272 mg/dL — ABNORMAL HIGH (ref 70–99)

## 2018-07-13 LAB — HEPARIN LEVEL (UNFRACTIONATED)
HEPARIN UNFRACTIONATED: 0.21 [IU]/mL — AB (ref 0.30–0.70)
Heparin Unfractionated: 0.37 IU/mL (ref 0.30–0.70)

## 2018-07-13 MED ORDER — HEPARIN BOLUS VIA INFUSION
1000.0000 [IU] | Freq: Once | INTRAVENOUS | Status: AC
  Administered 2018-07-13: 1000 [IU] via INTRAVENOUS
  Filled 2018-07-13: qty 1000

## 2018-07-13 MED ORDER — CLOPIDOGREL BISULFATE 75 MG PO TABS: 75.0000 mg | ORAL_TABLET | Freq: Every day | ORAL | 11 refills | Status: AC

## 2018-07-13 MED ORDER — HYDROCODONE-ACETAMINOPHEN 5-325 MG PO TABS: 1.0000 | ORAL_TABLET | Freq: Four times a day (QID) | ORAL | 0 refills | Status: AC | PRN

## 2018-07-13 NOTE — Progress Notes (Addendum)
Pt prepared for d/c to SNF by Pryor Ochoa RN. IV d/c'd. Skin intact except as charted in most recent assessments. Vitals are stable. Right femoral dressing clean and intact. No signs of bleeding.Report called to receiving facility. Pt to be transported by ambulance service.  Jody Dorsey [employer]

## 2018-07-13 NOTE — Progress Notes (Signed)
ANTICOAGULATION CONSULT NOTE  Pharmacy Consult for heparin Indication: S/P Left lower extremity angiography with catheter placement   Allergies  Allergen Reactions  . Ace Inhibitors Other (See Comments)    Noted by [facility]  . Amlodipine Other (See Comments)    Unknown  . Norvasc [Amlodipine Besylate] Cough  . Simvastatin Other (See Comments)    Noted by [facility]   . Celebrex [Celecoxib] Hives, Itching and Rash    Patient Measurements: Height: 5\' 4"  (162.6 cm) Weight: 159 lb 13.3 oz (72.5 kg) IBW/kg (Calculated) : 54.7 Heparin Dosing Weight: 69.6 kg  Vital Signs: Temp: 97.6 F (36.4 C) (09/04 0808) Temp Source: Oral (09/04 0808) BP: 157/52 (09/04 0808) Pulse Rate: 72 (09/04 0808)  Labs: Recent Labs    07/12/18 1301 07/13/18 0158 07/13/18 1104  HGB  --   --  9.5*  HCT  --   --  28.9*  PLT  --   --  294  HEPARINUNFRC  --  0.21* 0.37  CREATININE 1.39*  --   --     Estimated Creatinine Clearance: 28.9 mL/min (A) (by C-G formula based on SCr of 1.39 mg/dL (H)).   Medical History: Past Medical History:  Diagnosis Date  . Bronchitis   . CAD (coronary artery disease)    status post 2 stents (Dr. Claiborne Billings, Dr. Elisabeth Cara, Dr. Fath-cardiologist) 2008; MI, 10/2008  . Chronic kidney disease   . Colonic adenoma    unspecified  . Dementia   . Diabetes mellitus type 2, uncomplicated ([place])   . DM (diabetes mellitus) ([place])   . Dysphagia   . Glaucoma   . HTN (hypertension)   . Hypothyroidism   . Microalbuminuria    diabetic  . Osteoarthritis   . Thyroid disease     Assessment: Patient has just undergone a Left lower extremity angiography with a catheter placement by vascular surgery. She received a bolus of 5000 units in the cath lab. As ordered by Dr Delana Meyer, heparin drip will begin 2 hours after sheath removal. She was here in early August and received IV heparin on that admission   Heparin infusion started at 950 units/hr based on previous dosing in early August  9/4 0200  heparin level 0.21. 1000 unit bolus and increase rate to 1100 units/hr. Recheck in 8 hours.  Goal of Therapy:  Heparin level 0.3-0.7 units/ml Monitor platelets by anticoagulation protocol: Yes   Plan:  9/4 1100 heparin level 0.37 = therapeutic x1. RN confirms heparin running at 11 ml/hr; no s/sx of bleeding noted.  Will continue heparin at current rate. Recheck heparin level in 8h for confirmation. CBC in AM.  Pharmacy will continue to follow  Rocky Morel, PharmD 07/13/2018,11:40 AM

## 2018-07-13 NOTE — Progress Notes (Signed)
Inpatient Diabetes Program Recommendations  AACE/ADA: New Consensus Statement on Inpatient Glycemic Control (2015)  Target Ranges:  Prepandial:   less than 140 mg/dL      Peak postprandial:   less than 180 mg/dL (1-2 hours)      Critically ill patients:  140 - 180 mg/dL   Lab Results  Component Value Date   GLUCAP 272 (H) 07/13/2018   HGBA1C 8.0 (H) 06/17/2018    Review of Glycemic ControlResults for JONNI, OELKERS (MRN [id number]) as of 07/13/2018 14:12  Ref. Range 07/12/2018 15:59 07/12/2018 17:27 07/12/2018 22:24 07/13/2018 08:09 07/13/2018 12:15  Glucose-Capillary Latest Ref Range: 70 - 99 mg/dL 252 (H) 234 (H) 328 (H) 297 (H) 272 (H)    Diabetes history: DM Outpatient Diabetes medications: Humalog 75/25 15 units tid with meals, Metformin 500 mg bid Current orders for Inpatient glycemic control:  Novolog sensitive tid with meals and HS Inpatient Diabetes Program Recommendations:   While in the hospital, consider adding Lantus 10 units daily.   Thanks,  Adah Perl, RN, BC-ADM Inpatient Diabetes Coordinator Pager [phone] (8a-5p)

## 2018-07-13 NOTE — Clinical Social Work Note (Addendum)
Patient to be d/c'ed today to [facility].  Patient and family agreeable to plans will transport via ems RN to call report to [phone].  Patient's daughter was at bedside and is aware that patient is discharging today.  Evette Cristal, MSW, [place]

## 2018-07-13 NOTE — Clinical Social Work Note (Signed)
Clinical Social Work Assessment  Patient Details  Name: Jody Dorsey MRN: [id number] Date of Birth: [birth year]  Date of referral:  07/13/18               Reason for consult:  Facility Placement                Permission sought to share information with:  Facility Sport and exercise psychologist Permission granted to share information::  Yes, Verbal Permission Granted  Name::     Jacobo [place] Daughter [phone] or Nevaeh, Korte [phone] or Drake,Richard Pandora Leiter [phone]   Agency::  SNF admissions  Relationship::     Contact Information:     Housing/Transportation Living arrangements for the past 2 months:  [place] of Information:  Medical Team, Adult Children Patient Interpreter Needed:  None Criminal Activity/Legal Involvement Pertinent to Current Situation/Hospitalization:  No - Comment as needed Significant Relationships:  Adult Children Lives with:  Facility Resident Do you feel safe going back to the place where you live?  Yes Need for family participation in patient care:  Yes (Comment)  Care giving concerns:  Patient's family did not express any concerns about returning back to SNF.   Social Worker assessment / plan:  Patient is an 84 year old female who is alert and oriented x2.  Assessment completed by speaking with patient's daughter due to patient having dementia.  Per patient's daughter, she has been at [facility] for several years.  Patient's daughter was explained role of CSW and process of coordinating patient's return back to SNF.  Patient's daughter was aware of the process, she did not have any questions about patient returning back to SNF.  Patient's daughter was trying to have [place] transportation pick her up, however because of surgery that was completed today, SNF wanted EMS transport.  CSW was also informed by patient's nurse, that EMS would be a safer transport option for discharge.  Patient's daughter did not  have any other questions or concerns, permission was given to send clinical information to [facility].  Employment status:  Retired Nurse, adult PT Recommendations:  Not assessed at this time Information / Referral to community resources:  [place]  Patient/Family's Response to care: Patient and family agreeable to returning back to SNF.  Patient/Family's Understanding of and Emotional Response to Diagnosis, Current Treatment, and Prognosis:  Patient and family are aware of current treatment plan, they expressed that they are glad patient does not have to be hear very long.  Emotional Assessment Appearance:  Appears stated age Attitude/Demeanor/Rapport:    Affect (typically observed):  Accepting, Appropriate Orientation:  Oriented to Self, Oriented to Place Alcohol / Substance use:  Not Applicable Psych involvement (Current and /or in the community):  No (Comment)  Discharge Needs  Concerns to be addressed:  Cognitive Concerns, Care Coordination Readmission within the last 30 days:  Yes(06-15-18 back to [facility]) Current discharge risk:  None Barriers to Discharge:  No Barriers Identified   Ross Ludwig, LCSWA 07/13/2018, 4:16 PM

## 2018-07-13 NOTE — NC FL2 (Signed)
[place] LEVEL OF CARE SCREENING TOOL     IDENTIFICATION  Patient Name: Jody Dorsey Birthdate: 29-May-1930 Sex: female Admission Date (Current Location): 07/12/2018  [place] and [place] Number:  Engineering geologist and Address:  [facility], [address] [place], [place] [address]      Provider Number: [id number]  Attending Physician Name and Address:  Katha Cabal, MD  Relative Name and Phone Number:  Jacobo Forest Daughter [phone]     Current Level of Care: Hospital Recommended Level of Care: [place] Prior Approval Number:    Date Approved/Denied:   PASRR Number: [id number]  Discharge Plan: SNF    Current Diagnoses: Patient Active Problem List   Diagnosis Date Noted  . Atherosclerosis of artery of extremity with ulceration ([place]) 07/12/2018  . Atherosclerosis of native arteries of the extremities with ulceration ([place]) 06/14/2018  . Benign hypertensive heart disease without CHF 05/22/2018  . Dyslipidemia associated with type 2 diabetes mellitus ([place]) 05/22/2018  . Renal disease due to diabetes mellitus ([place]) 05/22/2018  . Chronic generalized pain 05/22/2018  . Diabetic ulcer of right foot ([place]) 05/22/2018  . Stroke-like episode ([place]) 04/04/2018  . Hx of falling 03/08/2017  . Dysphagia, oropharyngeal phase 03/08/2017  . Peripheral vascular disease, unspecified ([place]) 03/08/2017  . Atherosclerotic heart disease of native coronary artery with unspecified angina pectoris ([place]) 03/08/2017  . Essential (primary) hypertension 03/08/2017  . Glaucoma 03/08/2017  . Hypothyroidism 03/08/2017  . Allergic rhinitis 03/08/2017  . Nutritional deficiency 03/08/2017  . Hyperlipidemia 03/08/2017  . Hypokalemia 12/14/2016  . SDH (subdural hematoma) ([place]) 11/30/2012  . Type 2 diabetes mellitus with stage 3 chronic kidney disease ([place]) 11/30/2012    Orientation RESPIRATION BLADDER Height & Weight      Self, Place  Normal Incontinent Weight: 159 lb 13.3 oz (72.5 kg) Height:  5\' 4"  (162.6 cm)  BEHAVIORAL SYMPTOMS/MOOD NEUROLOGICAL BOWEL NUTRITION STATUS      Continent Diet(Regular diet)  AMBULATORY STATUS COMMUNICATION OF NEEDS Skin   Limited Assist Verbally Surgical wounds                       Personal Care Assistance Level of Assistance  Bathing, Feeding, Dressing Bathing Assistance: Maximum assistance Feeding assistance: Maximum assistance Dressing Assistance: Maximum assistance     Functional Limitations Info  Speech, Hearing, Sight Sight Info: Adequate Hearing Info: Adequate Speech Info: Adequate    SPECIAL CARE FACTORS FREQUENCY                       Contractures Contractures Info: Not present    Additional Factors Info  Code Status, Allergies, Insulin Sliding Scale Code Status Info: DNR Allergies Info: ACE INHIBITORS, AMLODIPINE, NORVASC AMLODIPINE BESYLATE, SIMVASTATIN, CELEBREX CELECOXIB    Insulin Sliding Scale Info: insulin aspart (novoLOG) injection 0-5 Units        Current Medications (07/13/2018):  This is the current hospital active medication list Current Facility-Administered Medications  Medication Dose Route Frequency Provider Last Rate Last Dose  . 0.9 %  sodium chloride infusion  250 mL Intravenous PRN Schnier, Dolores Lory, MD   Stopped at 07/13/18 0900  . amLODipine (NORVASC) tablet 10 mg  10 mg Oral Daily Schnier, Dolores Lory, MD   10 mg at 07/13/18 0347  . aspirin chewable tablet 81 mg  81 mg Oral Daily Schnier, Dolores Lory, MD   81 mg at 07/13/18 4259  . cholecalciferol (VITAMIN D)  tablet 2,000 Units  2,000 Units Oral Daily Schnier, Dolores Lory, MD   2,000 Units at 07/13/18 [phone]  . cloNIDine (CATAPRES) tablet 0.3 mg  0.3 mg Oral BID Schnier, Dolores Lory, MD   0.3 mg at 07/13/18 0557  . clopidogrel (PLAVIX) tablet 75 mg  75 mg Oral Daily Schnier, Dolores Lory, MD   75 mg at 07/13/18 3016  . collagenase (SANTYL) ointment 1 application  1  application Topical Daily Schnier, Dolores Lory, MD   1 application at 11/17/30 1231  . DERMACLOUD CREA 1 application  1 application Topical PRN Schnier, Dolores Lory, MD      . dextrose (GLUTOSE) 40 % oral gel 37.5 g  1 Tube Oral PRN Schnier, Dolores Lory, MD      . fluticasone (FLONASE) 50 MCG/ACT nasal spray 1 spray  1 spray Each Nare Daily Schnier, Dolores Lory, MD   1 spray at 07/13/18 0835  . heparin ADULT infusion 100 units/mL (25000 units/262mL sodium chloride 0.45%)  1,100 Units/hr Intravenous Continuous Schnier, Dolores Lory, MD 11 mL/hr at 07/13/18 1207 1,100 Units/hr at 07/13/18 1207  . hydrochlorothiazide (HYDRODIURIL) tablet 25 mg  25 mg Oral Daily Schnier, Dolores Lory, MD   25 mg at 07/13/18 3557  . insulin aspart (novoLOG) injection 0-5 Units  0-5 Units Subcutaneous QHS Algernon Huxley, MD   4 Units at 07/12/18 2328  . insulin aspart (novoLOG) injection 0-9 Units  0-9 Units Subcutaneous TID WC Algernon Huxley, MD   5 Units at 07/13/18 1230  . irbesartan (AVAPRO) tablet 300 mg  300 mg Oral Daily Schnier, Dolores Lory, MD   300 mg at 07/13/18 0848  . isosorbide mononitrate (IMDUR) 24 hr tablet 30 mg  30 mg Oral Daily Schnier, Dolores Lory, MD   30 mg at 07/13/18 [phone]  . latanoprost (XALATAN) 0.005 % ophthalmic solution 1 drop  1 drop Both Eyes QHS Schnier, Dolores Lory, MD   1 drop at 07/12/18 2123  . levothyroxine (SYNTHROID, LEVOTHROID) tablet 50 mcg  50 mcg Oral QAC breakfast Schnier, Dolores Lory, MD   50 mcg at 07/13/18 0831  . metFORMIN (GLUCOPHAGE) tablet 500 mg  500 mg Oral BID WC Schnier, Dolores Lory, MD   500 mg at 07/12/18 1840  . metoprolol succinate (TOPROL-XL) 24 hr tablet 100 mg  100 mg Oral BID Delana Meyer Dolores Lory, MD   100 mg at 07/13/18 2542  . morphine 4 MG/ML injection 2 mg  2 mg Intravenous Q1H PRN Schnier, Dolores Lory, MD      . nitroGLYCERIN (NITROSTAT) SL tablet 0.4 mg  0.4 mg Sublingual Q5 min PRN Schnier, Dolores Lory, MD      . ondansetron [facility]) injection 4 mg  4 mg Intravenous Q6H PRN Schnier,  Dolores Lory, MD      . oxyCODONE (Oxy IR/ROXICODONE) immediate release tablet 5-10 mg  5-10 mg Oral Q4H PRN Schnier, Dolores Lory, MD      . Derrill Memo ON 07/17/2018] rosuvastatin (CRESTOR) tablet 5 mg  5 mg Oral Q Sun Schnier, Dolores Lory, MD      . senna-docusate (Senokot-S) tablet 1 tablet  1 tablet Oral BID PRN Schnier, Dolores Lory, MD      . sodium chloride flush (NS) 0.9 % injection 3 mL  3 mL Intravenous Q12H Schnier, Dolores Lory, MD      . sodium chloride flush (NS) 0.9 % injection 3 mL  3 mL Intravenous PRN Schnier, Dolores Lory, MD      . traMADol Veatrice [place])  tablet 50 mg  50 mg Oral Q6H PRN Schnier, Dolores Lory, MD         Discharge Medications: Please see discharge summary for a list of discharge medications.  Relevant Imaging Results:  Relevant Lab Results:   Additional Information SS# [id number]  Anell Barr

## 2018-07-13 NOTE — Discharge Summary (Signed)
[facility]    Discharge Summary    Patient ID:  Jody Dorsey MRN: [id number] DOB/AGE: 04-21-1930 84 y.o.  Admit date: 07/12/2018 Discharge date: 07/13/2018 Date of Surgery: 07/12/2018 Surgeon: Surgeon(s): Schnier, Dolores Lory, MD  Admission Diagnosis: Atherosclerosis of artery of extremity with ulceration ([place]) [I70.299, L97.909]  Discharge Diagnoses:  Atherosclerosis of artery of extremity with ulceration ([place]) [I70.299, L97.909]  Secondary Diagnoses: Past Medical History:  Diagnosis Date  . Bronchitis   . CAD (coronary artery disease)    status post 2 stents (Dr. Claiborne Billings, Dr. Elisabeth Cara, Dr. Fath-cardiologist) 2008; MI, 10/2008  . Chronic kidney disease   . Colonic adenoma    unspecified  . Dementia   . Diabetes mellitus type 2, uncomplicated ([place])   . DM (diabetes mellitus) ([place])   . Dysphagia   . Glaucoma   . HTN (hypertension)   . Hypothyroidism   . Microalbuminuria    diabetic  . Osteoarthritis   . Thyroid disease     Procedure(s): LOWER EXTREMITY ANGIOGRAPHY  Discharged Condition: fair  HPI:  On the day of admission the patient underwent left lower extremity angiography.  Unfortunately this was not successful in regaining luminal access and neither the posterior tibial nor anterior tibial artery could be recruited.  Of note the popliteal intervention from last month is widely patent so there is some improvement in her distal perfusion compared with 2 months ago.  Given this finding I elected to observe her overnight on a heparin drip.  Today she denies pain.  Her foot is warm with good capillary refill.  Her ulcer is unchanged.  Hospital Course:  Jody Dorsey is a 84 y.o. female is S/P Left Procedure(s): LOWER EXTREMITY ANGIOGRAPHY Extubated: POD # 0 Physical exam: Left foot is pink with brisk capillary refill wound is unchanged pedal pulses are nonpalpable Post-op wounds separated or healing well Pt. Ambulating, voiding and taking  PO diet without difficulty. Pt pain controlled with PO pain meds. Labs as below Complications: Inability to gain true lumen access distally  Consults:    Significant Diagnostic Studies: CBC Lab Results  Component Value Date   WBC 7.5 07/13/2018   HGB 9.5 (L) 07/13/2018   HCT 28.9 (L) 07/13/2018   MCV 91.4 07/13/2018   PLT 294 07/13/2018    BMET    Component Value Date/Time   NA 136 03/25/2018 1135   NA 137 11/30/2012 1710   K 3.9 03/25/2018 1135   K 3.8 04/25/2013 1030   CL 102 03/25/2018 1135   CL 103 11/30/2012 1710   CO2 24 03/25/2018 1135   CO2 27 11/30/2012 1710   GLUCOSE 153 (H) 03/25/2018 1135   GLUCOSE 196 (H) 11/30/2012 1710   BUN 32 (H) 07/12/2018 1301   BUN 25 (H) 11/30/2012 1710   CREATININE 1.39 (H) 07/12/2018 1301   CREATININE 0.97 11/30/2012 1710   CALCIUM 9.1 03/25/2018 1135   CALCIUM 9.0 11/30/2012 1710   GFRNONAA 34 (L) 07/12/2018 1301   GFRNONAA 55 (L) 11/30/2012 1710   GFRAA 39 (L) 07/12/2018 1301   GFRAA >60 11/30/2012 1710   COAG Lab Results  Component Value Date   INR 0.94 06/14/2018   INR 0.96 11/30/2012   INR 1.0 11/30/2012     Disposition:  Discharge to :[place] Discharge Instructions    Call MD for:  redness, tenderness, or signs of infection (pain, swelling, bleeding, redness, odor or green/yellow discharge around incision site)   Complete by:  As directed  Call MD for:  severe or increased pain, loss or decreased feeling  in affected limb(s)   Complete by:  As directed    Call MD for:  temperature >100.5   Complete by:  As directed    Discharge instructions   Complete by:  As directed    Continue left foot dressing with Santyl as ordered   No dressing needed   Complete by:  As directed    Replace only if drainage present   Resume previous diet   Complete by:  As directed      Allergies as of 07/13/2018      Reactions   Ace Inhibitors Other (See Comments)   Noted by [facility]   Amlodipine Other (See  Comments)   Unknown   Norvasc [amlodipine Besylate] Cough   Simvastatin Other (See Comments)   Noted by [facility]    Celebrex [celecoxib] Hives, Itching, Rash      Medication List    STOP taking these medications   traMADol 50 MG tablet Commonly known as:  ULTRAM     TAKE these medications   acetaminophen 650 MG CR tablet Commonly known as:  TYLENOL Take 650 mg by mouth every 12 (twelve) hours.   acetaminophen 325 MG tablet Commonly known as:  TYLENOL Take 650 mg by mouth every 6 (six) hours as needed for moderate pain. do not exceed 3grams in 24hr period   amLODipine 10 MG tablet Commonly known as:  NORVASC Take 10 mg by mouth daily.   aspirin 81 MG chewable tablet Chew 81 mg by mouth daily.   cloNIDine 0.2 MG tablet Commonly known as:  CATAPRES Take 0.4 mg by mouth 2 (two) times daily. (Please let provider know when med order is due to be re-newed from pharmacy- new dose to be called in.)   clopidogrel 75 MG tablet Commonly known as:  PLAVIX Take 1 tablet (75 mg total) by mouth daily. What changed:  Another medication with the same name was added. Make sure you understand how and when to take each.   clopidogrel 75 MG tablet Commonly known as:  PLAVIX Take 1 tablet (75 mg total) by mouth daily. What changed:  You were already taking a medication with the same name, and this prescription was added. Make sure you understand how and when to take each.   collagenase ointment Commonly known as:  SANTYL Apply 1 application topically daily. Cleanse left lateral first toe with ns, skin prep periwound, apply santyl 1/4 inch nickel thick, cover with ns gauze and allevyn.   DERMACLOUD Crea Apply 1 application topically as needed (for skin irritation). Apply liberal amount topically to area of skin irritation as needed. Ok to leave at bedside   dextrose 40 % Gel Commonly known as:  GLUTOSE Take 1 Tube by mouth as needed for low blood sugar. Give 1 tube every 30 minutes Until BS  > 70 and pt is still responsive.   fluticasone 50 MCG/ACT nasal spray Commonly known as:  FLONASE Place 1 spray into both nostrils daily.   GLUCAGON EMERGENCY 1 MG injection Generic drug:  glucagon Inject 1 mg into the muscle once as needed. For hypoglycemia (symptomatic) not responding to oral glucose or snack. May repeat x 1 after 15 mins. Use only if pt is becoming unresponsive.   HYDROcodone-acetaminophen 5-325 MG tablet Commonly known as:  NORCO/VICODIN Take 1-2 tablets by mouth every 6 (six) hours as needed for moderate pain or severe pain.   insulin lispro protamine-lispro (75-25)  100 UNIT/ML Susp injection Commonly known as:  HUMALOG 75/25 MIX Inject 15 Units into the skin 3 (three) times daily with meals.   isosorbide mononitrate 30 MG 24 hr tablet Commonly known as:  IMDUR Take 30 mg by mouth daily.   latanoprost 0.005 % ophthalmic solution Commonly known as:  XALATAN Place 1 drop into both eyes at bedtime.   levothyroxine 50 MCG tablet Commonly known as:  SYNTHROID, LEVOTHROID Take 50 mcg by mouth daily before breakfast.   metFORMIN 500 MG tablet Commonly known as:  GLUCOPHAGE Take 500 mg by mouth 2 (two) times daily with a meal.   metoprolol succinate 100 MG 24 hr tablet Commonly known as:  TOPROL-XL Take 100 mg by mouth 2 (two) times daily. Take with or immediately following a meal.   MUSCLE RUB 10-15 % Crea Apply 1 application topically 2 (two) times daily as needed. What changed:  reasons to take this   nitroGLYCERIN 0.4 MG SL tablet Commonly known as:  NITROSTAT Place 0.4 mg under the tongue every 5 (five) minutes as needed for chest pain.   olmesartan-hydrochlorothiazide 40-25 MG tablet Commonly known as:  BENICAR HCT Take 1 tablet by mouth daily.   OXYGEN Inhale 2 L into the lungs as needed. For dyspnea ,   Check O2 saturations prior to placing on patient and at least every 4 hours after applying. Notify MD if oxygen saturations drop below baseline  while receiving oxygen or no improvement in dyspnea   Potassium Chloride CR 8 MEQ Cpcr capsule CR Commonly known as:  MICRO-K Take 8 mEq by mouth 2 (two) times daily.   rosuvastatin 5 MG tablet Commonly known as:  CRESTOR Take 5 mg by mouth every Sunday. In the evening   senna-docusate 8.6-50 MG tablet Commonly known as:  Senokot-S Take 1 tablet by mouth 2 (two) times daily as needed for constipation.   UNABLE TO FIND Diet Change: Please add moisture/gravy as a side on all meals. Diet Type: No Concentrated Sweets   Vitamin D3 2000 units capsule Take 2,000 Units by mouth daily.      Verbal and written Discharge instructions given to the patient. Wound care per Discharge AVS Follow-up Information    Schnier, Dolores Lory, MD Follow up in 1 week(s).   Specialties:  Vascular Surgery, Cardiology, Radiology, Vascular Surgery Why:  no studies double book if needed Contact information: [place] [address] [phone]           Signed: Hortencia Pilar, MD  07/13/2018, 3:13 PM

## 2018-07-13 NOTE — Progress Notes (Signed)
Per Dr. Lucky Cowboy, paged Dr. Delana Meyer re: is patient discharging today and that transport to [facility] only available up to 1600 today/patient still on Heparin drip. Waiting for response.

## 2018-07-13 NOTE — Progress Notes (Signed)
Paged Dr. Lucky Cowboy on call for Vascular/Dr. Delana Meyer re: patient's daughter insistent that patient is discharging today and that [facility] takes transports only up to 1600. Discharge orders have not been ordered/entered; no note refers to it, but there is an expected D/C date of 9/4 listed in the Manage Orders area. CM not aware of any discharge today either. Patient on Heparin drip. Awaiting Dr. Bunnie Domino response.

## 2018-07-13 NOTE — Progress Notes (Signed)
ANTICOAGULATION CONSULT NOTE  Pharmacy Consult for heparin Indication: S/P Left lower extremity angiography with catheter placement   Allergies  Allergen Reactions  . Ace Inhibitors Other (See Comments)    Noted by [facility]  . Amlodipine Other (See Comments)    Unknown  . Norvasc [Amlodipine Besylate] Cough  . Simvastatin Other (See Comments)    Noted by [facility]   . Celebrex [Celecoxib] Hives, Itching and Rash    Patient Measurements: Height: 5\' 4"  (162.6 cm) Weight: 159 lb 13.3 oz (72.5 kg) IBW/kg (Calculated) : 54.7 Heparin Dosing Weight: 69 kg  Vital Signs: Temp: 98.4 F (36.9 C) (09/03 2347) Temp Source: Oral (09/03 2347) BP: 136/48 (09/03 2347) Pulse Rate: 91 (09/03 2347)  Labs: Recent Labs    07/12/18 1301 07/13/18 0158  HEPARINUNFRC  --  0.21*  CREATININE 1.39*  --     Estimated Creatinine Clearance: 28.9 mL/min (A) (by C-G formula based on SCr of 1.39 mg/dL (H)).   Medical History: Past Medical History:  Diagnosis Date  . Bronchitis   . CAD (coronary artery disease)    status post 2 stents (Dr. Claiborne Billings, Dr. Elisabeth Cara, Dr. Fath-cardiologist) 2008; MI, 10/2008  . Chronic kidney disease   . Colonic adenoma    unspecified  . Dementia   . Diabetes mellitus type 2, uncomplicated ([place])   . DM (diabetes mellitus) ([place])   . Dysphagia   . Glaucoma   . HTN (hypertension)   . Hypothyroidism   . Microalbuminuria    diabetic  . Osteoarthritis   . Thyroid disease     Assessment: Patient has just undergone a Left lower extremity angiography with a catheter placement by vascular surgery. She received a bolus of 5000 units in the cath lab. As ordered by Dr Delana Meyer, heparin drip will begin 2 hours after sheath removal. She was here in early August and received IV heparin on that admission  Goal of Therapy:  Heparin level 0.3-0.7 units/ml Monitor platelets by anticoagulation protocol: Yes   Plan:  Start heparin infusion at 950 units/hr based on previous dosing in  early August Check anti-Xa level in 8 hours and daily while on heparin Continue to monitor H&H and platelets  9/4 0200 heparin level 0.21. 1000 unit bolus and increase rate to 1100 units/hr. Recheck in 8 hours.  Dannell Gortney S, PharmD 07/13/2018,2:59 AM

## 2018-07-13 NOTE — Care Management Obs Status (Addendum)
[place] NOTIFICATION   Patient Details  Name: KINZLEIGH KANDLER MRN: [id number] Date of Birth: [birth year]   Medicare Observation Status Notification Given:   discussed with daughter: Blair Dolphin, RN 07/13/2018, 10:34 AM

## 2018-07-21 ENCOUNTER — Ambulatory Visit (INDEPENDENT_AMBULATORY_CARE_PROVIDER_SITE_OTHER): Payer: Medicare Other | Admitting: Vascular Surgery

## 2018-07-21 ENCOUNTER — Non-Acute Institutional Stay (SKILLED_NURSING_FACILITY): Payer: Medicare Other | Admitting: Adult Health

## 2018-07-21 ENCOUNTER — Encounter (INDEPENDENT_AMBULATORY_CARE_PROVIDER_SITE_OTHER): Payer: Self-pay

## 2018-07-21 ENCOUNTER — Encounter (INDEPENDENT_AMBULATORY_CARE_PROVIDER_SITE_OTHER): Payer: Self-pay | Admitting: Vascular Surgery

## 2018-07-21 ENCOUNTER — Encounter: Payer: Self-pay | Admitting: Adult Health

## 2018-07-21 VITALS — BP 147/63 | HR 74 | Resp 16 | Ht 64.0 in | Wt 155.2 lb

## 2018-07-21 DIAGNOSIS — Z794 Long term (current) use of insulin: Secondary | ICD-10-CM

## 2018-07-21 DIAGNOSIS — E034 Atrophy of thyroid (acquired): Secondary | ICD-10-CM | POA: Diagnosis not present

## 2018-07-21 DIAGNOSIS — N183 Chronic kidney disease, stage 3 unspecified: Secondary | ICD-10-CM

## 2018-07-21 DIAGNOSIS — E1122 Type 2 diabetes mellitus with diabetic chronic kidney disease: Secondary | ICD-10-CM

## 2018-07-21 DIAGNOSIS — I7025 Atherosclerosis of native arteries of other extremities with ulceration: Secondary | ICD-10-CM | POA: Diagnosis not present

## 2018-07-21 DIAGNOSIS — E1121 Type 2 diabetes mellitus with diabetic nephropathy: Secondary | ICD-10-CM | POA: Diagnosis not present

## 2018-07-21 DIAGNOSIS — I10 Essential (primary) hypertension: Secondary | ICD-10-CM | POA: Diagnosis not present

## 2018-07-21 DIAGNOSIS — I25119 Atherosclerotic heart disease of native coronary artery with unspecified angina pectoris: Secondary | ICD-10-CM

## 2018-07-21 DIAGNOSIS — E1169 Type 2 diabetes mellitus with other specified complication: Secondary | ICD-10-CM

## 2018-07-21 DIAGNOSIS — E785 Hyperlipidemia, unspecified: Secondary | ICD-10-CM

## 2018-07-21 NOTE — Progress Notes (Signed)
Location:   [facility] Room Number: [phone] Place of Service:  SNF (31)   CODE STATUS: DNR  Allergies  Allergen Reactions  . Ace Inhibitors Other (See Comments)    Noted by [facility]  . Amlodipine Other (See Comments)    Unknown  . Norvasc [Amlodipine Besylate] Cough  . Simvastatin Other (See Comments)    Noted by [facility]   . Celebrex [Celecoxib] Hives, Itching and Rash    Chief Complaint  Patient presents with  . Medical Management of Chronic Issues    Dyslipidemia; hypothyroidism; diabetes; nephropathy     HPI:  She is a 84 year old long term resident of this facility being seen for the management of her chronic illnesses: dyslipidemia; hypothyroidism; diabetes; nephropathy. She is unable to fully participate in the hpi or ros. There are no nursing concerns. There are no reports of changes in appetite; no uncontrolled pain; no signs of agitation present.   Past Medical History:  Diagnosis Date  . Bronchitis   . CAD (coronary artery disease)    status post 2 stents (Dr. Claiborne Billings, Dr. Elisabeth Cara, Dr. Fath-cardiologist) 2008; MI, 10/2008  . Chronic kidney disease   . Colonic adenoma    unspecified  . Dementia   . Diabetes mellitus type 2, uncomplicated ([place])   . DM (diabetes mellitus) ([place])   . Dysphagia   . Glaucoma   . HTN (hypertension)   . Hypothyroidism   . Microalbuminuria    diabetic  . Osteoarthritis   . Thyroid disease     Past Surgical History:  Procedure Laterality Date  . ABDOMINAL HYSTERECTOMY    . APPENDECTOMY  1949  . CATARACT EXTRACTION     left eye  . CERVICAL LAMINECTOMY  1994  . CESAREAN SECTION     x 3  . COLONOSCOPY W/ POLYPECTOMY  11/2007   (RTE) PCI/DES - Stents x 2 sequential- LAD 12/8/200  . CORONARY ANGIOPLASTY WITH STENT PLACEMENT     x 2  . LOWER EXTREMITY ANGIOGRAPHY Left 06/14/2018   Procedure: LOWER EXTREMITY ANGIOGRAPHY;  Surgeon: Katha Cabal, MD;  Location: [facility];  Service: Cardiovascular;   Laterality: Left;  . LOWER EXTREMITY ANGIOGRAPHY Left 07/12/2018   Procedure: LOWER EXTREMITY ANGIOGRAPHY;  Surgeon: Katha Cabal, MD;  Location: [facility];  Service: Cardiovascular;  Laterality: Left;  . ROTATOR CUFF REPAIR Left 1990    Social History   Socioeconomic History  . Marital status: Widowed    Spouse name: Not on file  . Number of children: 3  . Years of education: 75  . Highest education level: 11th grade  Occupational History  . Not on file  Social Needs  . Financial resource strain: Not on file  . Food insecurity:    Worry: Not on file    Inability: Not on file  . Transportation needs:    Medical: Not on file    Non-medical: Not on file  Tobacco Use  . Smoking status: Never Smoker  . Smokeless tobacco: Never Used  Substance and Sexual Activity  . Alcohol use: No  . Drug use: No  . Sexual activity: Never  Lifestyle  . Physical activity:    Days per week: Not on file    Minutes per session: Not on file  . Stress: Not on file  Relationships  . Social connections:    Talks on phone: Not on file    Gets together: Not on file  Attends religious service: Not on file    Active member of club or organization: Not on file    Attends meetings of clubs or organizations: Not on file    Relationship status: Not on file  . Intimate partner violence:    Fear of current or ex partner: Not on file    Emotionally abused: Not on file    Physically abused: Not on file    Forced sexual activity: Not on file  Other Topics Concern  . Not on file  Social History Narrative   Lives alone. Widowed since 11/12/2011. Denies tobacco    PCP Dr. Kern [place]   Lives in [place]    Denies cigarette use    Denies alcohol use   DNR   Admitted to [facility] 12/14/2016               Family History  Problem Relation Age of Onset  . Stroke Mother       VITAL SIGNS BP (!) 152/66   Pulse 89   Temp 97.8 F (36.6 C) (Oral)    Resp 18   Ht 5\' 4"  (1.626 m)   Wt 155 lb 1.6 oz (70.4 kg)   SpO2 96%   BMI 26.62 kg/m   Outpatient Encounter Medications as of 07/21/2018  Medication Sig  . acetaminophen (TYLENOL) 325 MG tablet Take 650 mg by mouth every 6 (six) hours as needed for moderate pain. do not exceed 3grams in 24hr period  . acetaminophen (TYLENOL) 650 MG CR tablet Take 650 mg by mouth every 12 (twelve) hours.  Marland Kitchen amLODipine (NORVASC) 10 MG tablet Take 10 mg by mouth daily. HOLD FOR SBP LESS THAN 110,  . aspirin 81 MG chewable tablet Chew 81 mg by mouth daily.  . Cholecalciferol (VITAMIN D3) 2000 units capsule Take 2,000 Units by mouth daily.   . cloNIDine (CATAPRES) 0.2 MG tablet Take 0.4 mg by mouth 2 (two) times daily. HOLD FOR SBP LESS THAN 110 (Please let provider know when med order is due to be re-newed from pharmacy- new dose to be called in.)  . clopidogrel (PLAVIX) 75 MG tablet Take 1 tablet (75 mg total) by mouth daily.  . clopidogrel (PLAVIX) 75 MG tablet Take 1 tablet (75 mg total) by mouth daily.  Marland Kitchen dextrose (GLUTOSE) 40 % GEL Take 1 Tube by mouth as needed for low blood sugar. Give 1 tube every 30 minutes Until BS > 70 and pt is still responsive.  . fluticasone (FLONASE) 50 MCG/ACT nasal spray Place 1 spray into both nostrils daily.   Marland Kitchen glucagon (GLUCAGON EMERGENCY) 1 MG injection Inject 1 mg into the muscle once as needed. For hypoglycemia (symptomatic) not responding to oral glucose or snack. May repeat x 1 after 15 mins. Use only if pt is becoming unresponsive.  Marland Kitchen HYDROcodone-acetaminophen (NORCO) 5-325 MG tablet Take 1-2 tablets by mouth every 6 (six) hours as needed for moderate pain or severe pain.  . Infant Care Products (DERMACLOUD) CREA Apply 1 application topically as needed (for skin irritation). Apply liberal amount topically to area of skin irritation as needed. Ok to leave at bedside   . insulin lispro protamine-lispro (HUMALOG 75/25 MIX) (75-25) 100 UNIT/ML SUSP injection Inject 18 Units  into the skin 3 (three) times daily with meals.   . isosorbide mononitrate (IMDUR) 30 MG 24 hr tablet Take 30 mg by mouth daily.   Marland Kitchen latanoprost (XALATAN) 0.005 % ophthalmic solution Place 1 drop into both eyes at  bedtime.   Marland Kitchen levothyroxine (SYNTHROID, LEVOTHROID) 50 MCG tablet Take 50 mcg by mouth daily before breakfast.   . Menthol-Methyl Salicylate (MUSCLE RUB) 10-15 % CREA Apply 1 application topically 2 (two) times daily as needed for muscle pain. Apply to area of pain  . metFORMIN (GLUCOPHAGE) 500 MG tablet Take 500 mg by mouth 2 (two) times daily with a meal.   . metoprolol succinate (TOPROL-XL) 100 MG 24 hr tablet Take 100 mg by mouth 2 (two) times daily. Take with or immediately following a meal.   . nitroGLYCERIN (NITROSTAT) 0.4 MG SL tablet Place 0.4 mg under the tongue every 5 (five) minutes as needed for chest pain.   Marland Kitchen olmesartan-hydrochlorothiazide (BENICAR HCT) 40-25 MG tablet Take 1 tablet by mouth daily. DX: Accelerated HTN; HOLD FOR SBP <110  . OXYGEN Inhale 2 L into the lungs as needed. For dyspnea ,   Check O2 saturations prior to placing on patient and at least every 4 hours after applying. Notify MD if oxygen saturations drop below baseline while receiving oxygen or no improvement in dyspnea  . Potassium Chloride CR (MICRO-K) 8 MEQ CPCR capsule CR Take 8 mEq by mouth 2 (two) times daily.  . rosuvastatin (CRESTOR) 5 MG tablet Take 5 mg by mouth every Sunday. In the evening  . senna-docusate (SENOKOT-S) 8.6-50 MG per tablet Take 1 tablet by mouth 2 (two) times daily as needed for constipation.  Marland Kitchen UNABLE TO FIND Diet Type: No Concentrated Sweets  . [DISCONTINUED] acetaminophen (TYLENOL) 650 MG CR tablet Take 650 mg by mouth every 12 (twelve) hours.   . [DISCONTINUED] collagenase (SANTYL) ointment Apply 1 application topically daily. Cleanse left lateral first toe with ns, skin prep periwound, apply santyl 1/4 inch nickel thick, cover with ns gauze and allevyn.  . [DISCONTINUED]  Menthol-Methyl Salicylate (MUSCLE RUB) 10-15 % CREA Apply 1 application topically 2 (two) times daily as needed. (Patient taking differently: Apply 1 application topically 2 (two) times daily as needed for muscle pain. )   No facility-administered encounter medications on file as of 07/21/2018.      SIGNIFICANT DIAGNOSTIC EXAMS  PREVIOUS:  06-14-18: peripheral vascular catheterization:  The abdominal aorta is opacified with a bolus injection contrast. Renal arteries are single and patent. The aorta itself has diffuse disease but no hemodynamically significant lesions. The common and external iliac arteries are widely patent bilaterally.  The left common femoral is widely patent as is the profunda femoris.  The SFA is patent with diffuse disease but no heme dynamically significant lesions however the popliteal and the below-knee area does indeed have tandem significant stenosis greater than 80%.  The distal popliteal demonstrates increasing disease and the trifurcation is heavily diseased with occlusion of the anterior tibial and peroneal.  The posterior tibial and tibioperoneal trunk demonstrates a multiple segments of occlusion throughout its course.  Following angioplasty the posterior tibial now has in-line flow and less than 20% residual stenosis. Angioplasty of the popliteal yields an excellent result with less than 10% residual stenosis.  NO NEW EXAMS.    LABS REVIEWED: PREVIOUS:   11-16-17: hgb a1c 8.3 chol 234; ldl 144; trig  223; hdl 45 tsh 1.949 03-25-18: wbc 8.0; hgb 12.2; hct 36.6; mcv 94.7; plt 148; glucose 153; bun 37; creat 1.31; k+ 3.9; na++ 136; ca 9.1  06-14-18: wbc 7.4; hgb 11.8; hct 34.9; mcv 94.2; plt 187 06-15-18: wbc 7.5; hgb 12.2; hct 35.7; mcv 93.2; plt 186   TODAY;   06-17-18: hgb a1c 8.0  07-13-18: wbc 7.5; hgb 9.5; hct 28.9; mvc 91.4; plt 294     Review of Systems  Reason unable to perform ROS: confusion     Physical Exam  Constitutional: She appears well-developed  and well-nourished. No distress.  Neck: No thyromegaly present.  Cardiovascular: Normal rate, regular rhythm and normal heart sounds.  Bilateral pedal pulses + doppler  Bilateral feet cold    Pulmonary/Chest: Effort normal and breath sounds normal. No respiratory distress.  Abdominal: Soft. Bowel sounds are normal. She exhibits no distension. There is no tenderness.  Musculoskeletal: She exhibits edema.  1+ bilateral lower extremity edema Is able to move all extremities    Lymphadenopathy:    She has no cervical adenopathy.  Neurological: She is alert.  Skin: Skin is warm and dry. She is not diaphoretic.  Psychiatric: She has a normal mood and affect.    ASSESSMENT/ PLAN:  TODAY:    1.  Hypothyroidism due to atrophy of thyroid: is stable tsh 1.949 will continue synthroid 50 mcg daily   2.  Type 2 diabetes mellitus with stage 3 chronic kidney disease with long term use of insulin: hgb a1c 8.0 (previous8.3) : without change: will continue metformin 500 mg twice daily humalob mix 75/25 18 units three times daily with meals  3. Dyslipidemia associated with type 2 diabetes mellitus: is stable ldl 144 trig 233; will continue crestor 5 mg every Sunday  4.  Renal disease due to diabetes mellitus type 2: stable bun 37 creat 1.31  PREVIOUS   5. Hypokalemia: is stable k+ 3.9 will continue k+ 8 meq twice daily   6. Bilateral glaucoma: stable will continue xalatan to both eyes nightly   7. Chronic generalized pain: is being managed: will continue tylenol 650 mg twice daily and vicodin 5/325 mg 1 or 2 tabs  every 6 hours as needed  8. Atherosclerotic heart disease of native coronary artery with unspecified angina pectoris: is status post stent placement: is stable will continue imdur 30 mg daily  asa 81 mg daily toprol xl 100 mg twice daily  has prn ntg  9.  Benign hypertensive heart disease without CHF: is stable b/p 152/66; will continue norvasc 10 mg daily clonidine 0.4 mg twice daily  toprol xl 100 mg twice daily benicar hct 40/25 mg daily   10. Atherosclerosis of native arteries of the extremities with ulceration: is status post angiography of the left leg X 2 the last on 07-12-18.  will continue plavix 75 mg daily and asa 81 mg daily     MD is aware of resident's narcotic use and is in agreement with current plan of care. We will attempt to wean resident as apropriate   Ok Edwards NP [facility]  Contact [phone] Monday through Friday 8am- 5pm  After hours call [phone]

## 2018-07-23 ENCOUNTER — Encounter (INDEPENDENT_AMBULATORY_CARE_PROVIDER_SITE_OTHER): Payer: Self-pay | Admitting: Vascular Surgery

## 2018-07-23 NOTE — Progress Notes (Signed)
MRN : [id number]  Jody Dorsey is a 84 y.o. (June 01, 1930) female who presents with chief complaint of  Chief Complaint  Patient presents with  . Follow-up    1wee follow up  .  History of Present Illness: The patient returns to the office for followup s/p unsuccessful angiogram. There has been some deterioration in the lower extremity symptoms.  The patient notes her ulcers and her rest pain symptoms. No new ulcers or wounds have occurred since the last visit.  There have been no significant changes to the patient's overall health care.  The patient denies amaurosis fugax or recent TIA symptoms. There are no recent neurological changes noted. The patient denies history of DVT, PE or superficial thrombophlebitis. The patient denies recent episodes of angina or shortness of breath.    Current Meds  Medication Sig  . acetaminophen (TYLENOL) 325 MG tablet Take 650 mg by mouth every 6 (six) hours as needed for moderate pain. do not exceed 3grams in 24hr period  . acetaminophen (TYLENOL) 650 MG CR tablet Take 650 mg by mouth every 12 (twelve) hours.  Marland Kitchen amLODipine (NORVASC) 10 MG tablet Take 10 mg by mouth daily. HOLD FOR SBP LESS THAN 110,  . aspirin 81 MG chewable tablet Chew 81 mg by mouth daily.  . Cholecalciferol (VITAMIN D3) 2000 units capsule Take 2,000 Units by mouth daily.   . cloNIDine (CATAPRES) 0.2 MG tablet Take 0.4 mg by mouth 2 (two) times daily. HOLD FOR SBP LESS THAN 110 (Please let provider know when med order is due to be re-newed from pharmacy- new dose to be called in.)  . clopidogrel (PLAVIX) 75 MG tablet Take 1 tablet (75 mg total) by mouth daily.  . clopidogrel (PLAVIX) 75 MG tablet Take 1 tablet (75 mg total) by mouth daily.  Marland Kitchen dextrose (GLUTOSE) 40 % GEL Take 1 Tube by mouth as needed for low blood sugar. Give 1 tube every 30 minutes Until BS > 70 and pt is still responsive.  . fluticasone (FLONASE) 50 MCG/ACT nasal spray Place 1 spray into both nostrils daily.    Marland Kitchen glucagon (GLUCAGON EMERGENCY) 1 MG injection Inject 1 mg into the muscle once as needed. For hypoglycemia (symptomatic) not responding to oral glucose or snack. May repeat x 1 after 15 mins. Use only if pt is becoming unresponsive.  Marland Kitchen HYDROcodone-acetaminophen (NORCO) 5-325 MG tablet Take 1-2 tablets by mouth every 6 (six) hours as needed for moderate pain or severe pain.  . Infant Care Products (DERMACLOUD) CREA Apply 1 application topically as needed (for skin irritation). Apply liberal amount topically to area of skin irritation as needed. Ok to leave at bedside   . insulin lispro protamine-lispro (HUMALOG 75/25 MIX) (75-25) 100 UNIT/ML SUSP injection Inject 18 Units into the skin 3 (three) times daily with meals.   . isosorbide mononitrate (IMDUR) 30 MG 24 hr tablet Take 30 mg by mouth daily.   Marland Kitchen latanoprost (XALATAN) 0.005 % ophthalmic solution Place 1 drop into both eyes at bedtime.   Marland Kitchen levothyroxine (SYNTHROID, LEVOTHROID) 50 MCG tablet Take 50 mcg by mouth daily before breakfast.   . Menthol-Methyl Salicylate (MUSCLE RUB) 10-15 % CREA Apply 1 application topically 2 (two) times daily as needed for muscle pain. Apply to area of pain  . metFORMIN (GLUCOPHAGE) 500 MG tablet Take 500 mg by mouth 2 (two) times daily with a meal.   . metoprolol succinate (TOPROL-XL) 100 MG 24 hr tablet Take 100 mg by mouth 2 (  two) times daily. Take with or immediately following a meal.   . nitroGLYCERIN (NITROSTAT) 0.4 MG SL tablet Place 0.4 mg under the tongue every 5 (five) minutes as needed for chest pain.   Marland Kitchen olmesartan-hydrochlorothiazide (BENICAR HCT) 40-25 MG tablet Take 1 tablet by mouth daily. DX: Accelerated HTN; HOLD FOR SBP <110  . OXYGEN Inhale 2 L into the lungs as needed. For dyspnea ,   Check O2 saturations prior to placing on patient and at least every 4 hours after applying. Notify MD if oxygen saturations drop below baseline while receiving oxygen or no improvement in dyspnea  . Potassium  Chloride CR (MICRO-K) 8 MEQ CPCR capsule CR Take 8 mEq by mouth 2 (two) times daily.  . rosuvastatin (CRESTOR) 5 MG tablet Take 5 mg by mouth every Sunday. In the evening  . senna-docusate (SENOKOT-S) 8.6-50 MG per tablet Take 1 tablet by mouth 2 (two) times daily as needed for constipation.  Marland Kitchen UNABLE TO FIND Diet Type: No Concentrated Sweets    Past Medical History:  Diagnosis Date  . Bronchitis   . CAD (coronary artery disease)    status post 2 stents (Dr. Claiborne Billings, Dr. Elisabeth Cara, Dr. Fath-cardiologist) 2008; MI, 10/2008  . Chronic kidney disease   . Colonic adenoma    unspecified  . Dementia   . Diabetes mellitus type 2, uncomplicated ([place])   . DM (diabetes mellitus) ([place])   . Dysphagia   . Glaucoma   . HTN (hypertension)   . Hypothyroidism   . Microalbuminuria    diabetic  . Osteoarthritis   . Thyroid disease     Past Surgical History:  Procedure Laterality Date  . ABDOMINAL HYSTERECTOMY    . APPENDECTOMY  1949  . CATARACT EXTRACTION     left eye  . CERVICAL LAMINECTOMY  1994  . CESAREAN SECTION     x 3  . COLONOSCOPY W/ POLYPECTOMY  11/2007   (RTE) PCI/DES - Stents x 2 sequential- LAD 12/8/200  . CORONARY ANGIOPLASTY WITH STENT PLACEMENT     x 2  . LOWER EXTREMITY ANGIOGRAPHY Left 06/14/2018   Procedure: LOWER EXTREMITY ANGIOGRAPHY;  Surgeon: Katha Cabal, MD;  Location: [facility];  Service: Cardiovascular;  Laterality: Left;  . LOWER EXTREMITY ANGIOGRAPHY Left 07/12/2018   Procedure: LOWER EXTREMITY ANGIOGRAPHY;  Surgeon: Katha Cabal, MD;  Location: [place] [facility];  Service: Cardiovascular;  Laterality: Left;  . ROTATOR CUFF REPAIR Left 1990    Social History Social History   Tobacco Use  . Smoking status: Never Smoker  . Smokeless tobacco: Never Used  Substance Use Topics  . Alcohol use: No  . Drug use: No    Family History Family History  Problem Relation Age of Onset  . Stroke Mother     Allergies  Allergen Reactions  .  Ace Inhibitors Other (See Comments)    Noted by [facility]  . Amlodipine Other (See Comments)    Unknown  . Norvasc [Amlodipine Besylate] Cough  . Simvastatin Other (See Comments)    Noted by [facility]   . Celebrex [Celecoxib] Hives, Itching and Rash     REVIEW OF SYSTEMS (Negative unless checked)  Constitutional: [] Weight loss  [] Fever  [] Chills Cardiac: [] Chest pain   [] Chest pressure   [] Palpitations   [] Shortness of breath when laying flat   [] Shortness of breath with exertion. Vascular:  [x] Pain in legs with walking   [x] Pain in legs at rest  [] History of DVT   [] Phlebitis   [  x]Swelling in legs   [] Varicose veins   [x] Non-healing ulcers Pulmonary:   [] Uses home oxygen   [] Productive cough   [] Hemoptysis   [] Wheeze  [] COPD   [] Asthma Neurologic:  [] Dizziness   [] Seizures   [] History of stroke   [] History of TIA  [] Aphasia   [] Vissual changes   [] Weakness or numbness in arm   [] Weakness or numbness in leg Musculoskeletal:   [] Joint swelling   [x] Joint pain   [] Low back pain Hematologic:  [] Easy bruising  [] Easy bleeding   [] Hypercoagulable state   [] Anemic Gastrointestinal:  [] Diarrhea   [] Vomiting  [] Gastroesophageal reflux/heartburn   [] Difficulty swallowing. Genitourinary:  [x] Chronic kidney disease   [] Difficult urination  [] Frequent urination   [] Blood in urine Skin:  [] Rashes   [x] Ulcers  Psychological:  [] History of anxiety   []  History of major depression.  Physical Examination  Vitals:   07/21/18 1401  BP: (!) 147/63  Pulse: 74  Resp: 16  Weight: 155 lb 3.2 oz (70.4 kg)  Height: 5\' 4"  (1.626 m)   Body mass index is 26.64 kg/m. Gen: WD/WN, NAD Head: [place]/AT, No temporalis wasting.  Ear/Nose/Throat: Hearing grossly intact, nares w/o erythema or drainage Eyes: PER, EOMI, sclera nonicteric.  Neck: Supple, no large masses.   Pulmonary:  Good air movement, no audible wheezing bilaterally, no use of accessory muscles.  Cardiac: RRR, no JVD Vascular: left foot ulcers uninfected but  blandand not healing Vessel Right Left  Radial Palpable Palpable  PT Not Palpable Not Palpable  DP Not Palpable Not Palpable  Gastrointestinal: Non-distended. No guarding/no peritoneal signs.  Musculoskeletal: M/S 5/5 throughout.  No deformity or atrophy.  Neurologic: CN 2-12 intact. Symmetrical.  Speech is fluent. Motor exam as listed above. Psychiatric: Judgment intact, Mood & affect appropriate for pt's clinical situation. Dermatologic: No rashes + left foot ulcers noted.  No changes consistent with cellulitis. Lymph : No lichenification or skin changes of chronic lymphedema.  CBC Lab Results  Component Value Date   WBC 7.5 07/13/2018   HGB 9.5 (L) 07/13/2018   HCT 28.9 (L) 07/13/2018   MCV 91.4 07/13/2018   PLT 294 07/13/2018    BMET    Component Value Date/Time   NA 136 03/25/2018 1135   NA 137 11/30/2012 1710   K 3.9 03/25/2018 1135   K 3.8 04/25/2013 1030   CL 102 03/25/2018 1135   CL 103 11/30/2012 1710   CO2 24 03/25/2018 1135   CO2 27 11/30/2012 1710   GLUCOSE 153 (H) 03/25/2018 1135   GLUCOSE 196 (H) 11/30/2012 1710   BUN 32 (H) 07/12/2018 1301   BUN 25 (H) 11/30/2012 1710   CREATININE 1.39 (H) 07/12/2018 1301   CREATININE 0.97 11/30/2012 1710   CALCIUM 9.1 03/25/2018 1135   CALCIUM 9.0 11/30/2012 1710   GFRNONAA 34 (L) 07/12/2018 1301   GFRNONAA 55 (L) 11/30/2012 1710   GFRAA 39 (L) 07/12/2018 1301   GFRAA >60 11/30/2012 1710   Estimated Creatinine Clearance: 28.5 mL/min (A) (by C-G formula based on SCr of 1.39 mg/dL (H)).  COAG Lab Results  Component Value Date   INR 0.94 06/14/2018   INR 0.96 11/30/2012   INR 1.0 11/30/2012    Radiology No results found.   Assessment/Plan 1. Atherosclerosis of native arteries of the extremities with ulceration ([place])  Recommend:  The patient has evidence of severe atherosclerotic changes of both lower extremities associated with ulceration and tissue loss of the left foot.  This represents a limb  threatening ischemia  and places the patient at the risk for limb loss.  Previous angiograms have not been successful.  For this last attempt I will go left groin antegrade.  Patient should undergo angiography of the left lower extremities with the hope for intervention for limb salvage.  The risks and benefits as well as the alternative therapies was discussed in detail with the patient.  All questions were answered.  Patient agrees to proceed with angiography.  The patient will follow up with me in the office after the procedure.    2. Atherosclerosis of native coronary artery of native heart with angina pectoris ([place]) Continue cardiac and antihypertensive medications as already ordered and reviewed, no changes at this time.  Continue statin as ordered and reviewed, no changes at this time  Nitrates PRN for chest pain   3. Essential (primary) hypertension Continue antihypertensive medications as already ordered, these medications have been reviewed and there are no changes at this time.   4. Type 2 diabetes mellitus with stage 3 chronic kidney disease, with long-term current use of insulin ([facility]) Continue hypoglycemic medications as already ordered, these medications have been reviewed and there are no changes at this time.  Hgb A1C to be monitored as already arranged by primary service   5. Hyperlipidemia, unspecified hyperlipidemia type Continue statin as ordered and reviewed, no changes at this time     Hortencia Pilar, MD  07/23/2018 11:51 AM

## 2018-08-01 ENCOUNTER — Other Ambulatory Visit (INDEPENDENT_AMBULATORY_CARE_PROVIDER_SITE_OTHER): Payer: Self-pay | Admitting: Nurse Practitioner

## 2018-08-02 MED ORDER — DEXTROSE 5 % IV SOLN
2.0000 g | Freq: Once | INTRAVENOUS | Status: DC
  Filled 2018-08-02: qty 20

## 2018-08-03 ENCOUNTER — Encounter: Payer: Self-pay | Admitting: *Deleted

## 2018-08-03 ENCOUNTER — Ambulatory Visit
Admission: RE | Admit: 2018-08-03 | Discharge: 2018-08-03 | Disposition: A | Discharge status: Skilled Nursing Facility | Payer: Medicare Other | Source: Ambulatory Visit | Attending: Vascular Surgery | Admitting: Vascular Surgery

## 2018-08-03 ENCOUNTER — Encounter
Admission: RE | Disposition: A | Discharge status: Skilled Nursing Facility | Payer: Self-pay | Source: Ambulatory Visit | Attending: Vascular Surgery

## 2018-08-03 DIAGNOSIS — E1122 Type 2 diabetes mellitus with diabetic chronic kidney disease: Secondary | ICD-10-CM | POA: Insufficient documentation

## 2018-08-03 DIAGNOSIS — Z955 Presence of coronary angioplasty implant and graft: Secondary | ICD-10-CM | POA: Insufficient documentation

## 2018-08-03 DIAGNOSIS — I70299 Other atherosclerosis of native arteries of extremities, unspecified extremity: Secondary | ICD-10-CM

## 2018-08-03 DIAGNOSIS — Z86018 Personal history of other benign neoplasm: Secondary | ICD-10-CM | POA: Diagnosis not present

## 2018-08-03 DIAGNOSIS — Z90710 Acquired absence of both cervix and uterus: Secondary | ICD-10-CM | POA: Diagnosis not present

## 2018-08-03 DIAGNOSIS — M1990 Unspecified osteoarthritis, unspecified site: Secondary | ICD-10-CM | POA: Insufficient documentation

## 2018-08-03 DIAGNOSIS — E785 Hyperlipidemia, unspecified: Secondary | ICD-10-CM | POA: Insufficient documentation

## 2018-08-03 DIAGNOSIS — Z823 Family history of stroke: Secondary | ICD-10-CM | POA: Insufficient documentation

## 2018-08-03 DIAGNOSIS — N183 Chronic kidney disease, stage 3 (moderate): Secondary | ICD-10-CM | POA: Insufficient documentation

## 2018-08-03 DIAGNOSIS — I129 Hypertensive chronic kidney disease with stage 1 through stage 4 chronic kidney disease, or unspecified chronic kidney disease: Secondary | ICD-10-CM | POA: Insufficient documentation

## 2018-08-03 DIAGNOSIS — Z794 Long term (current) use of insulin: Secondary | ICD-10-CM | POA: Insufficient documentation

## 2018-08-03 DIAGNOSIS — I70245 Atherosclerosis of native arteries of left leg with ulceration of other part of foot: Secondary | ICD-10-CM | POA: Diagnosis not present

## 2018-08-03 DIAGNOSIS — Z9842 Cataract extraction status, left eye: Secondary | ICD-10-CM | POA: Diagnosis not present

## 2018-08-03 DIAGNOSIS — L97521 Non-pressure chronic ulcer of other part of left foot limited to breakdown of skin: Secondary | ICD-10-CM | POA: Insufficient documentation

## 2018-08-03 DIAGNOSIS — E039 Hypothyroidism, unspecified: Secondary | ICD-10-CM | POA: Insufficient documentation

## 2018-08-03 DIAGNOSIS — H409 Unspecified glaucoma: Secondary | ICD-10-CM | POA: Insufficient documentation

## 2018-08-03 DIAGNOSIS — Z888 Allergy status to other drugs, medicaments and biological substances status: Secondary | ICD-10-CM | POA: Insufficient documentation

## 2018-08-03 DIAGNOSIS — L97909 Non-pressure chronic ulcer of unspecified part of unspecified lower leg with unspecified severity: Secondary | ICD-10-CM

## 2018-08-03 DIAGNOSIS — I70201 Unspecified atherosclerosis of native arteries of extremities, right leg: Secondary | ICD-10-CM | POA: Diagnosis not present

## 2018-08-03 DIAGNOSIS — E1151 Type 2 diabetes mellitus with diabetic peripheral angiopathy without gangrene: Secondary | ICD-10-CM | POA: Diagnosis not present

## 2018-08-03 DIAGNOSIS — E11621 Type 2 diabetes mellitus with foot ulcer: Secondary | ICD-10-CM | POA: Insufficient documentation

## 2018-08-03 DIAGNOSIS — F0390 Unspecified dementia without behavioral disturbance: Secondary | ICD-10-CM | POA: Insufficient documentation

## 2018-08-03 HISTORY — PX: LOWER EXTREMITY ANGIOGRAPHY: CATH118251

## 2018-08-03 LAB — GLUCOSE, CAPILLARY: GLUCOSE-CAPILLARY: 275 mg/dL — AB (ref 70–99)

## 2018-08-03 LAB — CREATININE, SERUM
Creatinine, Ser: 1.44 mg/dL — ABNORMAL HIGH (ref 0.44–1.00)
GFR calc Af Amer: 37 mL/min — ABNORMAL LOW (ref >60)
GFR, EST NON AFRICAN AMERICAN: 32 mL/min — AB (ref >60)

## 2018-08-03 LAB — BUN: BUN: 39 mg/dL — AB (ref 8–23)

## 2018-08-03 SURGERY — LOWER EXTREMITY ANGIOGRAPHY
Anesthesia: Moderate Sedation | Laterality: Left

## 2018-08-03 MED ORDER — HEPARIN (PORCINE) IN NACL 1000-0.9 UT/500ML-% IV SOLN
INTRAVENOUS | Status: AC
  Filled 2018-08-03: qty 1000

## 2018-08-03 MED ORDER — SODIUM CHLORIDE 0.9% FLUSH: 3.0000 mL | INTRAVENOUS | Status: DC | PRN

## 2018-08-03 MED ORDER — MIDAZOLAM HCL 5 MG/5ML IJ SOLN
INTRAMUSCULAR | Status: AC
  Filled 2018-08-03: qty 5

## 2018-08-03 MED ORDER — FENTANYL CITRATE (PF) 100 MCG/2ML IJ SOLN
INTRAMUSCULAR | Status: AC
  Filled 2018-08-03: qty 2

## 2018-08-03 MED ORDER — SODIUM CHLORIDE 0.9 % IV SOLN: INTRAVENOUS | Status: DC

## 2018-08-03 MED ORDER — MIDAZOLAM HCL 2 MG/2ML IJ SOLN
INTRAMUSCULAR | Status: DC | PRN
  Administered 2018-08-03 (×2): 1 mg via INTRAVENOUS

## 2018-08-03 MED ORDER — HEPARIN SODIUM (PORCINE) 1000 UNIT/ML IJ SOLN
INTRAMUSCULAR | Status: AC
  Filled 2018-08-03: qty 1

## 2018-08-03 MED ORDER — SODIUM CHLORIDE 0.9 % IV SOLN: 250.0000 mL | INTRAVENOUS | Status: DC | PRN

## 2018-08-03 MED ORDER — HEPARIN SODIUM (PORCINE) 1000 UNIT/ML IJ SOLN
INTRAMUSCULAR | Status: DC | PRN
  Administered 2018-08-03: 5000 [IU] via INTRAVENOUS

## 2018-08-03 MED ORDER — SODIUM CHLORIDE 0.9 % IV SOLN
INTRAVENOUS | Status: DC
  Administered 2018-08-03: 10:00:00 via INTRAVENOUS

## 2018-08-03 MED ORDER — LIDOCAINE HCL (PF) 1 % IJ SOLN
INTRAMUSCULAR | Status: AC
  Filled 2018-08-03: qty 30

## 2018-08-03 MED ORDER — CEFAZOLIN SODIUM-DEXTROSE 2-4 GM/100ML-% IV SOLN
INTRAVENOUS | Status: AC
  Administered 2018-08-03: 11:00:00
  Filled 2018-08-03: qty 100

## 2018-08-03 MED ORDER — LABETALOL HCL 5 MG/ML IV SOLN: 10.0000 mg | INTRAVENOUS | Status: DC | PRN

## 2018-08-03 MED ORDER — HYDRALAZINE HCL 20 MG/ML IJ SOLN
5.0000 mg | INTRAMUSCULAR | Status: DC | PRN
Start: 2018-08-03 — End: 2018-08-03

## 2018-08-03 MED ORDER — ACETAMINOPHEN 325 MG PO TABS: 650.0000 mg | ORAL_TABLET | ORAL | Status: DC | PRN

## 2018-08-03 MED ORDER — OXYCODONE HCL 5 MG PO TABS: 5.0000 mg | ORAL_TABLET | ORAL | Status: DC | PRN

## 2018-08-03 MED ORDER — SODIUM CHLORIDE 0.9% FLUSH: 3.0000 mL | Freq: Two times a day (BID) | INTRAVENOUS | Status: DC

## 2018-08-03 MED ORDER — HYDROMORPHONE HCL 1 MG/ML IJ SOLN: 1.0000 mg | Freq: Once | INTRAMUSCULAR | Status: DC | PRN

## 2018-08-03 MED ORDER — MORPHINE SULFATE (PF) 4 MG/ML IV SOLN: 2.0000 mg | INTRAVENOUS | Status: DC | PRN

## 2018-08-03 MED ORDER — FENTANYL CITRATE (PF) 100 MCG/2ML IJ SOLN
INTRAMUSCULAR | Status: DC | PRN
  Administered 2018-08-03: 50 ug via INTRAVENOUS
  Administered 2018-08-03: 25 ug via INTRAVENOUS

## 2018-08-03 MED ORDER — ONDANSETRON HCL 4 MG/2ML IJ SOLN: 4.0000 mg | Freq: Four times a day (QID) | INTRAMUSCULAR | Status: DC | PRN

## 2018-08-03 MED ORDER — IOPAMIDOL (ISOVUE-300) INJECTION 61%
INTRAVENOUS | Status: DC | PRN
Start: 2018-08-03 — End: 2018-08-03
  Administered 2018-08-03: 70 mL via INTRA_ARTERIAL

## 2018-08-03 SURGICAL SUPPLY — 22 items
CATH BEACON 5 .035 65 KMP TIP (CATHETERS) ×3 IMPLANT
CATH CROSSER 14S OTW 106CM (CATHETERS) ×3 IMPLANT
CATH CROSSER 14S OTW 146CM (CATHETERS) IMPLANT
CATH CXI 4F 90 DAV (CATHETERS) ×3 IMPLANT
CATH CXI SUPP ANG 2.6FR 150CM (CATHETERS) ×3 IMPLANT
CATH CXI SUPP ANG 4FR 135 (CATHETERS) ×1 IMPLANT
CATH CXI SUPP ANG 4FR 135CM (CATHETERS) ×3
COVER PROBE U/S 5X48 (MISCELLANEOUS) ×3 IMPLANT
DEVICE CLOSURE MYNXGRIP 5F (Vascular Products) ×3 IMPLANT
DEVICE PRESTO INFLATION (MISCELLANEOUS) ×3 IMPLANT
DEVICE TORQUE .025-.038 (MISCELLANEOUS) ×3 IMPLANT
GLIDEWIRE ANGLED SS 035X260CM (WIRE) ×3 IMPLANT
GLIDEWIRE STIFF .35X180X3 HYDR (WIRE) ×3 IMPLANT
KIT FLOWMATE PROCEDURAL (KITS) ×3 IMPLANT
NEEDLE ENTRY 21GA 7CM ECHOTIP (NEEDLE) ×3 IMPLANT
PACK ANGIOGRAPHY (CUSTOM PROCEDURE TRAY) ×3 IMPLANT
SET INTRO CAPELLA COAXIAL (SET/KITS/TRAYS/PACK) ×3 IMPLANT
SHEATH BRITE TIP 5FRX11 (SHEATH) ×3 IMPLANT
SHIELD RADPAD SCOOP 12X17 (MISCELLANEOUS) ×3 IMPLANT
TOWEL OR 17X26 4PK STRL BLUE (TOWEL DISPOSABLE) ×3 IMPLANT
WIRE G V18X300CM (WIRE) ×3 IMPLANT
WIRE SPARTACORE .014X190CM (WIRE) ×3 IMPLANT

## 2018-08-03 NOTE — Discharge Instructions (Signed)
Femoral Site Care °Refer to this sheet in the next few weeks. These instructions provide you with information about caring for yourself after your procedure. Your health care provider may also give you more specific instructions. Your treatment has been planned according to current medical practices, but problems sometimes occur. Call your health care provider if you have any problems or questions after your procedure. °What can I expect after the procedure? °After your procedure, it is typical to have the following: °· Bruising at the site that usually fades within 1-2 weeks. °· Blood collecting in the tissue (hematoma) that may be painful to the touch. It should usually decrease in size and tenderness within 1-2 weeks. ° °Follow these instructions at home: °· Take medicines only as directed by your health care provider. °· You may shower 24-48 hours after the procedure or as directed by your health care provider. Remove the bandage (dressing) and gently wash the site with plain soap and water. Pat the area dry with a clean towel. Do not rub the site, because this may cause bleeding. °· Do not take baths, swim, or use a hot tub until your health care provider approves. °· Check your insertion site every day for redness, swelling, or drainage. °· Do not apply powder or lotion to the site. °· Limit use of stairs to twice a day for the first 2-3 days or as directed by your health care provider. °· Do not squat for the first 2-3 days or as directed by your health care provider. °· Do not lift over 10 lb (4.5 kg) for 5 days after your procedure or as directed by your health care provider. °· Ask your health care provider when it is okay to: °? Return to work or school. °? Resume usual physical activities or sports. °? Resume sexual activity. °· Do not drive home if you are discharged the same day as the procedure. Have someone else drive you. °· You may drive 24 hours after the procedure unless otherwise instructed by  your health care provider. °· Do not operate machinery or power tools for 24 hours after the procedure or as directed by your health care provider. °· If your procedure was done as an outpatient procedure, which means that you went home the same day as your procedure, a responsible adult should be with you for the first 24 hours after you arrive home. °· Keep all follow-up visits as directed by your health care provider. This is important. °Contact a health care provider if: °· You have a fever. °· You have chills. °· You have increased bleeding from the site. Hold pressure on the site. °Get help right away if: °· You have unusual pain at the site. °· You have redness, warmth, or swelling at the site. °· You have drainage (other than a small amount of blood on the dressing) from the site. °· The site is bleeding, and the bleeding does not stop after 30 minutes of holding steady pressure on the site. °· Your leg or foot becomes pale, cool, tingly, or numb. °This information is not intended to replace advice given to you by your health care provider. Make sure you discuss any questions you have with your health care provider. °Document Released: 06/29/2014 Document Revised: 04/02/2016 Document Reviewed: 05/15/2014 °Elsevier Interactive Patient Education © 2018 [employer] °Moderate Conscious Sedation, Adult, Care After °These instructions provide you with information about caring for yourself after your procedure. Your health care provider may also give you more   specific instructions. Your treatment has been planned according to current medical practices, but problems sometimes occur. Call your health care provider if you have any problems or questions after your procedure. °What can I expect after the procedure? °After your procedure, it is common: °· To feel sleepy for several hours. °· To feel clumsy and have poor balance for several hours. °· To have poor judgment for several hours. °· To vomit if you eat too  soon. ° °Follow these instructions at home: °For at least 24 hours after the procedure: ° °· Do not: °? Participate in activities where you could fall or become injured. °? Drive. °? Use heavy machinery. °? Drink alcohol. °? Take sleeping pills or medicines that cause drowsiness. °? Make important decisions or sign legal documents. °? Take care of children on your own. °· Rest. °Eating and drinking °· Follow the diet recommended by your health care provider. °· If you vomit: °? Drink water, juice, or soup when you can drink without vomiting. °? Make sure you have little or no nausea before eating solid foods. °General instructions °· Have a responsible adult stay with you until you are awake and alert. °· Take over-the-counter and prescription medicines only as told by your health care provider. °· If you smoke, do not smoke without supervision. °· Keep all follow-up visits as told by your health care provider. This is important. °Contact a health care provider if: °· You keep feeling nauseous or you keep vomiting. °· You feel light-headed. °· You develop a rash. °· You have a fever. °Get help right away if: °· You have trouble breathing. °This information is not intended to replace advice given to you by your health care provider. Make sure you discuss any questions you have with your health care provider. °Document Released: 08/16/2013 Document Revised: 03/30/2016 Document Reviewed: 02/15/2016 °Elsevier Interactive Patient Education © 2018 [employer] ° °

## 2018-08-03 NOTE — Op Note (Signed)
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  Left lower extremity angiography third order catheter placement with additional third order  2.  Crosser atherectomy unsuccessful left posterior tibial artery Anesthesia: Conscious sedation was administered by the interventional radiology RN under my direct supervision. IV Versed plus fentanyl were utilized. Continuous ECG, pulse oximetry and blood pressure was monitored throughout the entire procedure.  Conscious sedation was administered for a total of 98 minutes.  Sheath: 11 cm 5 French Pinnacle antegrade left common femoral  Contrast: 20 cc   Fluoroscopy Time: 13.3 minutes  Indications:  The patient presents to [facility] with gangrenous changes to the left foot and heel.  Pedal pulses are nonpalpable bilaterally suggesting atherosclerotic occlusive disease.  The risks and benefits as well as alternative therapies for lower extremity revascularization are reviewed with the patient all questions are answered the patient agrees to proceed.  The patient is therefore undergoing angiography with the hope for intervention for limb salvage.   Procedure:  Jody Schrum Obaughis a 84 y.o. female who was identified and appropriate procedural time out was performed.  The patient was then placed supine on the table and prepped and draped in the usual sterile fashion.  Ultrasound was used to evaluate the left common femoral artery.  It was echolucent and pulsatile indicating it is patent .  Fluoroscopy was also used to plan for the antegrade stick and ensure that the puncture was over the midportion of the common femoral.  An ultrasound image was acquired for the  permanent record.  A micropuncture needle was used to access the left common femoral artery under direct ultrasound guidance.  The microwire was then advanced under fluoroscopic guidance without difficulty followed by the micro-sheath.  A 0.035 J wire was advanced without resistance and a 5Fr sheath was placed.  The 11 cm seat there is now seated with the tip in the superficial femoral artery.  Hand-injection contrast was then used to demonstrate the SFA and popliteal anatomy.  Kumpe and Glidewire were then negotiated down to the trifurcation where magnified imaging was obtained.  Initially the posterior tibial artery was selected and a 035 wire exchanged for an 014 wire.  The 4 S crosser atherectomy catheter was prepped on the field and advanced over the wire the wire was then retracted and crosser atherectomy was performed of the posterior tibial.  Although blood work was returned at the level of the lateral plantars hand-injection of contrast through the catheter demonstrated that we were not intraluminal.  Despite multiple attempts intraluminal access could not be secured.  It was then decided to attempt to revascularize the peroneal for limb salvage.  Again using a combination of wires and catheters the peroneal was engaged hand-injection contrast verified proximal intraluminal placement however distal recanalization could never be achieved.  After review of the images the catheter was removed over wire and a minx device was deployed without difficulty.   Findings:   Left Lower Extremity:   The left common femoral superficial femoral popliteal although diseased are patent and without hemodynamically significant lesions.  Once again initial imaging demonstrates all 3 tibial vessels are occluded at various levels and distal runoff is interrupted there is no dominant runoff in the foot that is the dorsalis pedis lateral plantar  pedal arch are all intermittently patent and poorly visualized throughout  their course.  As noted above attempts at recannulization were unsuccessful.   Disposition: Patient was taken to the recovery room in stable condition having tolerated the procedure well.  Jody Dorsey 08/03/2018,1:38 PM

## 2018-08-03 NOTE — H&P (Signed)
[facility] History & Physical Update  The patient was interviewed and re-examined.  The patient's previous History and Physical has been reviewed and is unchanged.  There is no change in the plan of care. We plan to proceed with the scheduled procedure.  Hortencia Pilar, MD  08/03/2018, 9:32 AM

## 2018-08-04 ENCOUNTER — Encounter: Payer: Self-pay | Admitting: Vascular Surgery

## 2018-08-09 ENCOUNTER — Encounter
Admission: RE | Admit: 2018-08-09 | Discharge: 2018-08-09 | Disposition: A | Discharge status: Home / Self Care | Payer: Medicare Other | Source: Ambulatory Visit | Attending: Internal Medicine | Admitting: Internal Medicine

## 2018-08-11 ENCOUNTER — Non-Acute Institutional Stay (SKILLED_NURSING_FACILITY): Payer: Medicare Other | Admitting: Adult Health

## 2018-08-11 ENCOUNTER — Encounter: Payer: Self-pay | Admitting: Adult Health

## 2018-08-11 DIAGNOSIS — Z794 Long term (current) use of insulin: Secondary | ICD-10-CM | POA: Diagnosis not present

## 2018-08-11 DIAGNOSIS — N183 Chronic kidney disease, stage 3 unspecified: Secondary | ICD-10-CM

## 2018-08-11 DIAGNOSIS — E1122 Type 2 diabetes mellitus with diabetic chronic kidney disease: Secondary | ICD-10-CM

## 2018-08-11 NOTE — Progress Notes (Signed)
Location:   [facility] Room Number: [place] of Service:  SNF (31)   CODE STATUS: DNR  Allergies  Allergen Reactions  . Ace Inhibitors Other (See Comments)    Noted by [facility]  . Simvastatin Other (See Comments)    Noted by [facility]   . Celebrex [Celecoxib] Hives, Itching and Rash    Chief Complaint  Patient presents with  . Acute Visit    DM    HPI:  Her cbg readings are elevated. There are no reports of missed doses of medications. There are no reports of excessive hunger or thirst. There are no reports of anxiety present.   Past Medical History:  Diagnosis Date  . Bronchitis   . CAD (coronary artery disease)    status post 2 stents (Dr. Claiborne Billings, Dr. Elisabeth Cara, Dr. Fath-cardiologist) 2008; MI, 10/2008  . Chronic kidney disease   . Colonic adenoma    unspecified  . Dementia ([place])   . Diabetes mellitus type 2, uncomplicated ([place])   . DM (diabetes mellitus) ([place])   . Dysphagia   . Glaucoma   . HTN (hypertension)   . Hypothyroidism   . Microalbuminuria    diabetic  . Osteoarthritis   . Thyroid disease     Past Surgical History:  Procedure Laterality Date  . ABDOMINAL HYSTERECTOMY    . APPENDECTOMY  1949  . CATARACT EXTRACTION     left eye  . CERVICAL LAMINECTOMY  1994  . CESAREAN SECTION     x 3  . COLONOSCOPY W/ POLYPECTOMY  11/2007   (RTE) PCI/DES - Stents x 2 sequential- LAD 12/8/200  . CORONARY ANGIOPLASTY WITH STENT PLACEMENT     x 2  . LOWER EXTREMITY ANGIOGRAPHY Left 06/14/2018   Procedure: LOWER EXTREMITY ANGIOGRAPHY;  Surgeon: Katha Cabal, MD;  Location: [facility];  Service: Cardiovascular;  Laterality: Left;  . LOWER EXTREMITY ANGIOGRAPHY Left 07/12/2018   Procedure: LOWER EXTREMITY ANGIOGRAPHY;  Surgeon: Katha Cabal, MD;  Location: [facility];  Service: Cardiovascular;  Laterality: Left;  . LOWER EXTREMITY ANGIOGRAPHY Left 08/03/2018   Procedure: LOWER EXTREMITY ANGIOGRAPHY;  Surgeon: Katha Cabal, MD;  Location: [facility];  Service: Cardiovascular;  Laterality: Left;  . ROTATOR CUFF REPAIR Left 1990    Social History   Socioeconomic History  . Marital status: Widowed    Spouse name: Not on file  . Number of children: 3  . Years of education: 17  . Highest education level: 11th grade  Occupational History  . Not on file  Social Needs  . Financial resource strain: Not on file  . Food insecurity:    Worry: Not on file    Inability: Not on file  . Transportation needs:    Medical: Not on file    Non-medical: Not on file  Tobacco Use  . Smoking status: Never Smoker  . Smokeless tobacco: Never Used  Substance and Sexual Activity  . Alcohol use: No  . Drug use: No  . Sexual activity: Never  Lifestyle  . Physical activity:    Days per week: Not on file    Minutes per session: Not on file  . Stress: Not on file  Relationships  . Social connections:    Talks on phone: Not on file    Gets together: Not on file    Attends religious service: Not on file    Active member of club or organization: Not on  file    Attends meetings of clubs or organizations: Not on file    Relationship status: Not on file  . Intimate partner violence:    Fear of current or ex partner: Not on file    Emotionally abused: Not on file    Physically abused: Not on file    Forced sexual activity: Not on file  Other Topics Concern  . Not on file  Social History Narrative   Lives alone. Widowed since 11/12/2011. Denies tobacco    PCP Dr. Kern Alberta Roberta   Lives in [place]    Denies cigarette use    Denies alcohol use   DNR   Admitted to [facility] 12/14/2016               Family History  Problem Relation Age of Onset  . Stroke Mother       VITAL SIGNS BP (!) 162/56   Pulse 82   Temp (!) 97 F (36.1 C)   Resp 16   Ht 5\' 4"  (1.626 m)   Wt 155 lb 1.6 oz (70.4 kg)   SpO2 94%   BMI 26.62 kg/m   Outpatient Encounter Medications as of  08/11/2018  Medication Sig  . acetaminophen (TYLENOL) 325 MG tablet Take 650 mg by mouth every 6 (six) hours as needed for moderate pain. do not exceed 3grams in 24hr period  . acetaminophen (TYLENOL) 650 MG CR tablet Take 650 mg by mouth every 12 (twelve) hours as needed for pain.  Marland Kitchen amLODipine (NORVASC) 10 MG tablet Take 10 mg by mouth daily. HOLD FOR SBP LESS THAN 110,  . aspirin 81 MG chewable tablet Chew 81 mg by mouth daily.  . Cholecalciferol (VITAMIN D3) 2000 units capsule Take 2,000 Units by mouth daily.   . cloNIDine (CATAPRES) 0.2 MG tablet Take 0.4 mg by mouth 2 (two) times daily. HOLD FOR SBP LESS THAN 110 (Please let provider know when med order is due to be re-newed from pharmacy- new dose to be called in.)  . clopidogrel (PLAVIX) 75 MG tablet Take 1 tablet (75 mg total) by mouth daily.  Marland Kitchen dextrose (GLUTOSE) 40 % GEL Take 1 Tube by mouth as needed for low blood sugar. Give 1 tube every 30 minutes Until BS > 70 and pt is still responsive.  . fluticasone (FLONASE) 50 MCG/ACT nasal spray Place 1 spray into both nostrils daily.   Marland Kitchen glucagon (GLUCAGON EMERGENCY) 1 MG injection Inject 1 mg into the muscle once as needed. For hypoglycemia (symptomatic) not responding to oral glucose or snack. May repeat x 1 after 15 mins. Use only if pt is becoming unresponsive.  Marland Kitchen HYDROcodone-acetaminophen (NORCO) 5-325 MG tablet Take 1-2 tablets by mouth every 6 (six) hours as needed for moderate pain or severe pain.  . Infant Care Products (DERMACLOUD) CREA Apply 1 application topically daily as needed (for skin irritation). Apply liberal amount topically to area of skin irritation as needed. Ok to leave at bedside   . insulin lispro protamine-lispro (HUMALOG 75/25 MIX) (75-25) 100 UNIT/ML SUSP injection Inject 18 Units into the skin 3 (three) times daily with meals.   . isosorbide mononitrate (IMDUR) 30 MG 24 hr tablet Take 30 mg by mouth daily.   Marland Kitchen latanoprost (XALATAN) 0.005 % ophthalmic solution Place 1  drop into both eyes at bedtime.   Marland Kitchen levothyroxine (SYNTHROID, LEVOTHROID) 50 MCG tablet Take 50 mcg by mouth daily before breakfast.   . Menthol-Methyl Salicylate (MUSCLE RUB) 10-15 % CREA  Apply 1 application topically 2 (two) times daily as needed for muscle pain. Apply to area of pain  . metFORMIN (GLUCOPHAGE) 500 MG tablet Take 500 mg by mouth 2 (two) times daily with a meal.   . metoprolol succinate (TOPROL-XL) 100 MG 24 hr tablet Take 100 mg by mouth 2 (two) times daily. Take with or immediately following a meal.   . nitroGLYCERIN (NITROSTAT) 0.4 MG SL tablet Place 0.4 mg under the tongue every 5 (five) minutes as needed for chest pain.   . NON FORMULARY Diet type: No Concentrated Sweets - Add moisture / gravy as a side on all meals  . olmesartan-hydrochlorothiazide (BENICAR HCT) 40-25 MG tablet Take 1 tablet by mouth daily. DX: Accelerated HTN; HOLD FOR SBP <110  . OXYGEN Inhale 2 L into the lungs as needed. For dyspnea ,   Check O2 saturations prior to placing on patient and at least every 4 hours after applying. Notify MD if oxygen saturations drop below baseline while receiving oxygen or no improvement in dyspnea  . Potassium Chloride CR (MICRO-K) 8 MEQ CPCR capsule CR Take 8 mEq by mouth 2 (two) times daily.  . rosuvastatin (CRESTOR) 5 MG tablet Take 5 mg by mouth every Sunday. In the evening  . senna-docusate (SENOKOT-S) 8.6-50 MG per tablet Take 1 tablet by mouth 2 (two) times daily as needed for constipation.  . [DISCONTINUED] acetaminophen (TYLENOL) 650 MG CR tablet Take 650 mg by mouth every 12 (twelve) hours.   No facility-administered encounter medications on file as of 08/11/2018.      SIGNIFICANT DIAGNOSTIC EXAMS  PREVIOUS:  06-14-18: peripheral vascular catheterization:  The abdominal aorta is opacified with a bolus injection contrast. Renal arteries are single and patent. The aorta itself has diffuse disease but no hemodynamically significant lesions. The common and external  iliac arteries are widely patent bilaterally.  The left common femoral is widely patent as is the profunda femoris.  The SFA is patent with diffuse disease but no heme dynamically significant lesions however the popliteal and the below-knee area does indeed have tandem significant stenosis greater than 80%.  The distal popliteal demonstrates increasing disease and the trifurcation is heavily diseased with occlusion of the anterior tibial and peroneal.  The posterior tibial and tibioperoneal trunk demonstrates a multiple segments of occlusion throughout its course.  Following angioplasty the posterior tibial now has in-line flow and less than 20% residual stenosis. Angioplasty of the popliteal yields an excellent result with less than 10% residual stenosis.  NO NEW EXAMS.    LABS REVIEWED: PREVIOUS:   11-16-17: hgb a1c 8.3 chol 234; ldl 144; trig  223; hdl 45 tsh 1.949 03-25-18: wbc 8.0; hgb 12.2; hct 36.6; mcv 94.7; plt 148; glucose 153; bun 37; creat 1.31; k+ 3.9; na++ 136; ca 9.1  06-14-18: wbc 7.4; hgb 11.8; hct 34.9; mcv 94.2; plt 187 06-15-18: wbc 7.5; hgb 12.2; hct 35.7; mcv 93.2; plt 186  06-17-18: hgb a1c 8.0 07-13-18: wbc 7.5; hgb 9.5; hct 28.9; mvc 91.4; plt 294   NO NEW LABS.   Review of Systems  Unable to perform ROS: Other (confusion )     Physical Exam  Constitutional: She appears well-developed and well-nourished. No distress.  Neck: No thyromegaly present.  Cardiovascular: Normal rate, regular rhythm and normal heart sounds.  Bilateral pedal pulses + doppler  Bilateral feet cold     Pulmonary/Chest: Effort normal and breath sounds normal. No respiratory distress.  Abdominal: Soft. Bowel sounds are normal. She exhibits no  distension. There is no tenderness.  Musculoskeletal: She exhibits edema.  1+ bilateral lower extremity edema Is able to move all extremities     Lymphadenopathy:    She has no cervical adenopathy.  Neurological: She is alert.  Skin: Skin is warm and dry.  She is not diaphoretic.  Psychiatric: She has a normal mood and affect.     ASSESSMENT/ PLAN:  TODAY:    1. Type 2 diabetes mellitus with stage 3 chronic kidney disease with long term use of insulin: hgb a1c 8.0 (previous8.3) : cbgs elevated : will continue metformin 500 mg twice daily will change to  humalog mix 75/25 21 units three times daily with meals and humalog 3 units for cbg >200      MD is aware of resident's narcotic use and is in agreement with current plan of care. We will attempt to wean resident as apropriate   Ok Edwards NP [facility]  Contact [phone] Monday through Friday 8am- 5pm  After hours call [phone]

## 2018-08-17 ENCOUNTER — Encounter: Payer: Self-pay | Admitting: Adult Health

## 2018-08-17 ENCOUNTER — Other Ambulatory Visit
Admission: RE | Admit: 2018-08-17 | Discharge: 2018-08-17 | Disposition: A | Discharge status: Home / Self Care | Payer: Medicare Other | Source: Skilled Nursing Facility | Attending: Adult Health | Admitting: Adult Health

## 2018-08-17 ENCOUNTER — Non-Acute Institutional Stay (SKILLED_NURSING_FACILITY): Payer: Medicare Other | Admitting: Adult Health

## 2018-08-17 DIAGNOSIS — R638 Other symptoms and signs concerning food and fluid intake: Secondary | ICD-10-CM

## 2018-08-17 DIAGNOSIS — I7025 Atherosclerosis of native arteries of other extremities with ulceration: Secondary | ICD-10-CM | POA: Diagnosis not present

## 2018-08-17 DIAGNOSIS — R633 Feeding difficulties: Secondary | ICD-10-CM | POA: Insufficient documentation

## 2018-08-17 DIAGNOSIS — L089 Local infection of the skin and subcutaneous tissue, unspecified: Secondary | ICD-10-CM

## 2018-08-17 DIAGNOSIS — E1122 Type 2 diabetes mellitus with diabetic chronic kidney disease: Secondary | ICD-10-CM

## 2018-08-17 DIAGNOSIS — N183 Chronic kidney disease, stage 3 unspecified: Secondary | ICD-10-CM

## 2018-08-17 DIAGNOSIS — Z794 Long term (current) use of insulin: Secondary | ICD-10-CM

## 2018-08-17 DIAGNOSIS — I10 Essential (primary) hypertension: Secondary | ICD-10-CM | POA: Diagnosis not present

## 2018-08-17 LAB — BASIC METABOLIC PANEL
Anion gap: 17 — ABNORMAL HIGH (ref 5–15)
BUN: 41 mg/dL — AB (ref 8–23)
CO2: 22 mmol/L (ref 22–32)
Calcium: 9.3 mg/dL (ref 8.9–10.3)
Chloride: 100 mmol/L (ref 98–111)
Creatinine, Ser: 1.12 mg/dL — ABNORMAL HIGH (ref 0.44–1.00)
GFR calc Af Amer: 50 mL/min — ABNORMAL LOW (ref >60)
GFR, EST NON AFRICAN AMERICAN: 44 mL/min — AB (ref >60)
Glucose, Bld: 323 mg/dL — ABNORMAL HIGH (ref 70–99)
POTASSIUM: 4.4 mmol/L (ref 3.5–5.1)
Sodium: 139 mmol/L (ref 135–145)

## 2018-08-17 LAB — CBC
HCT: 29.9 % — ABNORMAL LOW (ref 36.0–46.0)
Hemoglobin: 8.8 g/dL — ABNORMAL LOW (ref 12.0–15.0)
MCH: 26.7 pg (ref 26.0–34.0)
MCHC: 29.4 g/dL — ABNORMAL LOW (ref 30.0–36.0)
MCV: 90.6 fL (ref 80.0–100.0)
PLATELETS: 524 10*3/uL — AB (ref 150–400)
RBC: 3.3 MIL/uL — AB (ref 3.87–5.11)
RDW: 14.6 % (ref 11.5–15.5)
WBC: 11.7 10*3/uL — ABNORMAL HIGH (ref 4.0–10.5)
nRBC: 0 % (ref 0.0–0.2)

## 2018-08-17 NOTE — Progress Notes (Signed)
[facility]  [facility] Room Number: 119-J Place of Service:  SNF ([phone]) Provider:  Durenda Age, NP  Patient Care Team: Kirk Ruths, MD as PCP - General (Internal Medicine) Ubaldo Glassing Javier Docker, MD as Consulting Physician (Cardiology)  Extended Emergency Contact Information Primary Emergency Contact: Drake,Karen S Address: [place], [place] [address] Johnnette Litter of [place] Phone: [phone] Work Phone: [phone] Relation: Daughter Secondary Emergency Contact: Delta of [place] Phone: [phone] Relation: Grandson  Code Status:  DNR  Goals of care: Advanced Directive information Advanced Directives 08/18/2018  Does Patient Have a Medical Advance Directive? Yes  Type of Advance Directive [place]  Does patient want to make changes to medical advance directive? No - Patient declined  Copy of [place] in Chart? -  Pre-existing out of facility DNR order (yellow form or pink MOST form) Yellow form placed in chart (order not valid for inpatient use)     Chief Complaint  Patient presents with  . Medical Management of Chronic Issues    Routine [facility] visit    HPI:  Pt is an 84 y.o. [place] seen today for medical management of chronic diseases.  She is a long-term care resident of [facility]  She has a PMH of CAD, CKD, colonic adenoma, dementia, DM, HTN, and hypothyroidism. She was seen in her room. She appears weak, talks softly and good eye contact. She has poor oral intake and not able to swallow medications. Daughter requested for her Catapres to be cut back into 0.1 mg.Latest BP 149/63 and HR 123. She is currently on Clonidine 0.2 mg 2 tabs = 0.4 mg BID.Left foot with black colored toes, dry. Lab work done today showed - wbc 11.7, hgb 8.8, glucose 323, BUN 41, creatinine 1.12, sodium 139, K 4.4, GFR 44. She follows up with vascular  surgery. She had a recent unsuccessful angiogram. She is scheduled to have angiography of the LLE with the hope for intervention for limb salvage.   Past Medical History:  Diagnosis Date  . Bronchitis   . CAD (coronary artery disease)    status post 2 stents (Dr. Claiborne Billings, Dr. Elisabeth Cara, Dr. Fath-cardiologist) 2008; MI, 10/2008  . Chronic kidney disease   . Colonic adenoma    unspecified  . Dementia ([facility])   . Diabetes mellitus type 2, uncomplicated ([place])   . DM (diabetes mellitus) ([place])   . Dysphagia   . Glaucoma   . HTN (hypertension)   . Hypothyroidism   . Microalbuminuria    diabetic  . Osteoarthritis   . Thyroid disease    Past Surgical History:  Procedure Laterality Date  . ABDOMINAL HYSTERECTOMY    . APPENDECTOMY  1949  . CATARACT EXTRACTION     left eye  . CERVICAL LAMINECTOMY  1994  . CESAREAN SECTION     x 3  . COLONOSCOPY W/ POLYPECTOMY  11/2007   (RTE) PCI/DES - Stents x 2 sequential- LAD 12/8/200  . CORONARY ANGIOPLASTY WITH STENT PLACEMENT     x 2  . LOWER EXTREMITY ANGIOGRAPHY Left 06/14/2018   Procedure: LOWER EXTREMITY ANGIOGRAPHY;  Surgeon: Katha Cabal, MD;  Location: [place] CV LAB;  Service: Cardiovascular;  Laterality: Left;  . LOWER EXTREMITY ANGIOGRAPHY Left 07/12/2018   Procedure: LOWER EXTREMITY ANGIOGRAPHY;  Surgeon: Katha Cabal, MD;  Location: [place] CV LAB;  Service: Cardiovascular;  Laterality: Left;  . LOWER EXTREMITY ANGIOGRAPHY Left 08/03/2018   Procedure: LOWER EXTREMITY ANGIOGRAPHY;  Surgeon: Katha Cabal, MD;  Location: [place] CV LAB;  Service: Cardiovascular;  Laterality: Left;  . ROTATOR CUFF REPAIR Left 1990    Allergies  Allergen Reactions  . Ace Inhibitors Other (See Comments)    Noted by [facility]  . Simvastatin Other (See Comments)    Noted by [facility]   . Celebrex [Celecoxib] Hives, Itching and Rash    Outpatient Encounter Medications as of 08/17/2018  Medication Sig  . acetaminophen (TYLENOL) 325 MG  tablet Take 650 mg by mouth every 6 (six) hours as needed for moderate pain. do not exceed 3grams in 24hr period  . acetaminophen (TYLENOL) 650 MG CR tablet Take 650 mg by mouth every 12 (twelve) hours.   Marland Kitchen amLODipine (NORVASC) 10 MG tablet Take 10 mg by mouth daily. HOLD FOR SBP LESS THAN 110,  . aspirin 81 MG chewable tablet Chew 81 mg by mouth daily.  . Cholecalciferol (VITAMIN D3) 2000 units capsule Take 2,000 Units by mouth daily.   . clopidogrel (PLAVIX) 75 MG tablet Take 1 tablet (75 mg total) by mouth daily.  Marland Kitchen dextrose (GLUTOSE) 40 % GEL Take 1 Tube by mouth as needed for low blood sugar. Give 1 tube every 30 minutes Until BS > 70 and pt is still responsive.  . fluticasone (FLONASE) 50 MCG/ACT nasal spray Place 1 spray into both nostrils daily.   Marland Kitchen glucagon (GLUCAGON EMERGENCY) 1 MG injection Inject 1 mg into the muscle once as needed. For hypoglycemia (symptomatic) not responding to oral glucose or snack. May repeat x 1 after 15 mins. Use only if pt is becoming unresponsive.  Marland Kitchen HYDROcodone-acetaminophen (NORCO) 5-325 MG tablet Take 1-2 tablets by mouth every 6 (six) hours as needed for moderate pain or severe pain.  . Infant Care Products (DERMACLOUD) CREA Apply 1 application topically daily as needed (for skin irritation). Apply liberal amount topically to area of skin irritation as needed. Ok to leave at bedside   . insulin lispro (HUMALOG KWIKPEN) 100 UNIT/ML KiwkPen Inject 3 Units into the skin 3 (three) times daily as needed (For CBG >200).  . insulin lispro protamine-lispro (HUMALOG 75/25 MIX) (75-25) 100 UNIT/ML SUSP injection Inject 21 Units into the skin 3 (three) times daily with meals.   . isosorbide mononitrate (IMDUR) 30 MG 24 hr tablet Take 30 mg by mouth daily.   Marland Kitchen latanoprost (XALATAN) 0.005 % ophthalmic solution Place 1 drop into both eyes at bedtime.   Marland Kitchen levothyroxine (SYNTHROID, LEVOTHROID) 50 MCG tablet Take 50 mcg by mouth daily before breakfast.   . Menthol-Methyl  Salicylate (MUSCLE RUB) 10-15 % CREA Apply 1 application topically 2 (two) times daily as needed for muscle pain. Apply to area of pain  . metFORMIN (GLUCOPHAGE) 500 MG tablet Take 500 mg by mouth 2 (two) times daily with a meal.   . metoprolol succinate (TOPROL-XL) 100 MG 24 hr tablet Take 100 mg by mouth 2 (two) times daily. Take with or immediately following a meal.   . nitroGLYCERIN (NITROSTAT) 0.4 MG SL tablet Place 0.4 mg under the tongue every 5 (five) minutes as needed for chest pain.   . NON FORMULARY Diet type: No Concentrated Sweets - Add moisture / gravy as a side on all meals  . olmesartan-hydrochlorothiazide (BENICAR HCT) 40-25 MG tablet Take 1 tablet by mouth daily. DX: Accelerated HTN; HOLD FOR SBP <110  . OXYGEN Inhale 2 L into the  lungs as needed. For dyspnea ,   Check O2 saturations prior to placing on patient and at least every 4 hours after applying. Notify MD if oxygen saturations drop below baseline while receiving oxygen or no improvement in dyspnea  . Potassium Chloride CR (MICRO-K) 8 MEQ CPCR capsule CR Take 8 mEq by mouth 2 (two) times daily.  . rosuvastatin (CRESTOR) 5 MG tablet Take 5 mg by mouth every Sunday. In the evening  . senna-docusate (SENOKOT-S) 8.6-50 MG per tablet Take 1 tablet by mouth 2 (two) times daily as needed for constipation.  . [DISCONTINUED] cloNIDine (CATAPRES) 0.2 MG tablet Take 0.4 mg by mouth 2 (two) times daily. HOLD FOR SBP LESS THAN 110 (Please let provider know when med order is due to be re-newed from pharmacy- new dose to be called in.)   No facility-administered encounter medications on file as of 08/17/2018.     Review of Systems  Unable to obtain due to dementia    Immunization History  Administered Date(s) Administered  . Influenza-Unspecified 08/13/2015, 08/12/2016, 08/03/2017  . PPD Test 12/10/2017   Pertinent  Health Maintenance Due  Topic Date Due  . INFLUENZA VACCINE  09/13/2018 (Originally 06/09/2018)  . PNA vac Low Risk  Adult (1 of 2 - PCV13) 09/13/2018 (Originally 08/28/1997)  . HEMOGLOBIN A1C  12/18/2018  . FOOT EXAM  Discontinued  . OPHTHALMOLOGY EXAM  Discontinued  . DEXA SCAN  Discontinued      Vitals:   08/17/18 1336  BP: 128/88  Pulse: 89  Resp: 18  Temp: (!) 97 F (36.1 C)  TempSrc: Oral  SpO2: 100%  Weight: 149 lb (67.6 kg)  Height: 5\' 4"  (1.626 m)   Body mass index is 25.58 kg/m.  Physical Exam  GENERAL APPEARANCE: Well nourished. Appears weak. Normal body habitus SKIN:  Left foot with black toes MOUTH and THROAT: Lips are without lesions.  RESPIRATORY: Breathing is even & unlabored, BS CTAB CARDIAC: RRR, no murmur,no extra heart sounds, no edema GI: Abdomen soft, normal BS, no masses, no tenderness EXTREMITIES:  No edema, left foot toes are black   Labs reviewed: Recent Labs    11/16/17 0530 05/Jody/19 1135  07/12/18 1301 08/03/18 0948 08/17/18 1400  NA 139 136  --   --   --  139  K 3.6 3.9  --   --   --  4.4  CL 100* 102  --   --   --  100  CO2 30 24  --   --   --  22  GLUCOSE 119* 153*  --   --   --  323*  BUN 34* 37*   < > 32* 39* 41*  CREATININE 0.99 1.31*   < > 1.39* 1.44* 1.12*  CALCIUM 9.7 9.1  --   --   --  9.3  MG 2.0  --   --   --   --   --    < > = values in this interval not displayed.   Recent Labs    11/16/17 0530  AST 22  ALT 18  ALKPHOS 55  BILITOT 0.6  PROT 7.5  ALBUMIN 4.2   Recent Labs    11/16/17 0530 05/Jody/19 1135  06/15/18 0959 07/13/18 1104 08/17/18 1400  WBC 5.8 8.0   < > 7.5 7.5 11.7*  NEUTROABS 2.8 6.1  --   --   --   --   HGB 13.5 12.2   < > 12.2 9.5* 8.8*  HCT  40.4 36.6   < > 35.7 28.9* 29.9*  MCV 95.1 94.7   < > 93.2 91.4 90.6  PLT 160 148*   < > 186 294 524*   < > = values in this interval not displayed.   Lab Results  Component Value Date   TSH 1.949 11/16/2017   Lab Results  Component Value Date   HGBA1C 8.0 (H) 06/17/2018   Lab Results  Component Value Date   CHOL 234 (H) 11/16/2017   HDL 45 11/16/2017     LDLCALC 144 (H) 11/16/2017   TRIG 223 (H) 11/16/2017   CHOLHDL 5.2 11/16/2017     Assessment/Plan  1. Left foot infection -   WBC 11, left foot toes black, patient appears weak, will start Rocephin 1 g IM daily x10 days   2. Essential (primary) hypertension - unable to swallow medications, will start Catapres 0.1 mg 1 patch transdermal Q 7 days, discontinue Clonidine 0.2 mg 2 tabs = 0.4 mg BID   3. Atherosclerosis of native arteries of the extremities with ulceration ([place]) - scheduled to have angiography of the LLE with the hope for intervention for limb salvage, follows up with Vascular surgery   4. Poor fluid intake -has not been eating for 3 days according to staff, latest CBG 298, BUN 41, creatinine 1.12, will start 0.9 NS at 70 mL/HR x1 L via IV for hydration  5. Type 2 diabetes mellitus with stage 3 chronic kidney disease, with long-term current use of insulin (HCC) -inject Humalog insulin 100 units/mL 3 units subcutaneous with meals, Humalog mix 75-25 insulin 100 units/mL inject 21 units subcutaneous with meals, CBG 4 times daily Lab Results  Component Value Date   HGBA1C 8.0 (H) 06/17/2018       Family/ staff Communication: Discussed plan of care with charge nurse.  Labs/tests ordered:  CBC, BMP stat  Goals of care:   Long-term care/palliative care.   Durenda Age, NP [facility] [phone] (Monday-Friday 8:00 a.m. - 5:00 p.m.) [phone] (after hours)

## 2018-08-18 ENCOUNTER — Non-Acute Institutional Stay (SKILLED_NURSING_FACILITY): Payer: Medicare Other | Admitting: Adult Health

## 2018-08-18 ENCOUNTER — Ambulatory Visit (INDEPENDENT_AMBULATORY_CARE_PROVIDER_SITE_OTHER): Payer: Medicare Other | Admitting: Vascular Surgery

## 2018-08-18 ENCOUNTER — Encounter: Payer: Self-pay | Admitting: Adult Health

## 2018-08-18 DIAGNOSIS — I119 Hypertensive heart disease without heart failure: Secondary | ICD-10-CM | POA: Diagnosis not present

## 2018-08-18 DIAGNOSIS — I70262 Atherosclerosis of native arteries of extremities with gangrene, left leg: Secondary | ICD-10-CM

## 2018-08-18 NOTE — Progress Notes (Signed)
[facility]   [facility] Room Number: [place] of Service:  SNF (31)   CODE STATUS: DNR  Allergies  Allergen Reactions  . Ace Inhibitors Other (See Comments)    Noted by [facility]  . Simvastatin Other (See Comments)    Noted by [facility]   . Celebrex [Celecoxib] Hives, Itching and Rash    Chief Complaint  Patient presents with  . Acute Visit    Change in Status    HPI:  She has had a change in her status. Her blood pressure readings are very high; her po intake is poor yesterday and today. She is alert today and is denying any pain. Her daughter wishes for aggressive care including possible amputation or her leg from her ulceration. There are no reports of fevers present. There are no physical signs of dehydration present. Her daughter and I have discussed her advanced directives including tube feeding. She is going to do some further thinking about her advanced directive.   Past Medical History:  Diagnosis Date  . Bronchitis   . CAD (coronary artery disease)    status post 2 stents (Dr. Claiborne Billings, Dr. Elisabeth Cara, Dr. Fath-cardiologist) 2008; MI, 10/2008  . Chronic kidney disease   . Colonic adenoma    unspecified  . Dementia ([place])   . Diabetes mellitus type 2, uncomplicated ([place])   . DM (diabetes mellitus) ([place])   . Dysphagia   . Glaucoma   . HTN (hypertension)   . Hypothyroidism   . Microalbuminuria    diabetic  . Osteoarthritis   . Thyroid disease     Past Surgical History:  Procedure Laterality Date  . ABDOMINAL HYSTERECTOMY    . APPENDECTOMY  1949  . CATARACT EXTRACTION     left eye  . CERVICAL LAMINECTOMY  1994  . CESAREAN SECTION     x 3  . COLONOSCOPY W/ POLYPECTOMY  11/2007   (RTE) PCI/DES - Stents x 2 sequential- LAD 12/8/200  . CORONARY ANGIOPLASTY WITH STENT PLACEMENT     x 2  . LOWER EXTREMITY ANGIOGRAPHY Left 06/14/2018   Procedure: LOWER EXTREMITY ANGIOGRAPHY;  Surgeon: Katha Cabal, MD;  Location: [place] CV LAB;   Service: Cardiovascular;  Laterality: Left;  . LOWER EXTREMITY ANGIOGRAPHY Left 07/12/2018   Procedure: LOWER EXTREMITY ANGIOGRAPHY;  Surgeon: Katha Cabal, MD;  Location: [place] [facility] LAB;  Service: Cardiovascular;  Laterality: Left;  . LOWER EXTREMITY ANGIOGRAPHY Left 08/03/2018   Procedure: LOWER EXTREMITY ANGIOGRAPHY;  Surgeon: Katha Cabal, MD;  Location: [place] [facility] LAB;  Service: Cardiovascular;  Laterality: Left;  . ROTATOR CUFF REPAIR Left 1990    Social History   Socioeconomic History  . Marital status: Widowed    Spouse name: Not on file  . Number of children: 3  . Years of education: 72  . Highest education level: 11th grade  Occupational History  . Not on file  Social Needs  . Financial resource strain: Not on file  . Food insecurity:    Worry: Not on file    Inability: Not on file  . Transportation needs:    Medical: Not on file    Non-medical: Not on file  Tobacco Use  . Smoking status: Never Smoker  . Smokeless tobacco: Never Used  Substance and Sexual Activity  . Alcohol use: No  . Drug use: No  . Sexual activity: Never  Lifestyle  . Physical activity:    Days per week: Not on file  Minutes per session: Not on file  . Stress: Not on file  Relationships  . Social connections:    Talks on phone: Not on file    Gets together: Not on file    Attends religious service: Not on file    Active member of club or organization: Not on file    Attends meetings of clubs or organizations: Not on file    Relationship status: Not on file  . Intimate partner violence:    Fear of current or ex partner: Not on file    Emotionally abused: Not on file    Physically abused: Not on file    Forced sexual activity: Not on file  Other Topics Concern  . Not on file  Social History Narrative   Lives alone. Widowed since 11/12/2011. Denies tobacco    PCP Dr. Kern Alberta    Lives in [place]    Denies cigarette use    Denies alcohol  use   DNR   Admitted to [facility] 12/14/2016               Family History  Problem Relation Age of Onset  . Stroke Mother       VITAL SIGNS BP (!) 198/84   Pulse (!) 118   Temp 98.9 F (37.2 C)   Resp 19   Ht 5\' 4"  (1.626 m)   Wt 149 lb (67.6 kg)   SpO2 100%   BMI 25.58 kg/m   Outpatient Encounter Medications as of 08/18/2018  Medication Sig  . acetaminophen (TYLENOL) 325 MG tablet Take 650 mg by mouth every 6 (six) hours as needed for moderate pain. do not exceed 3grams in 24hr period  . acetaminophen (TYLENOL) 650 MG CR tablet Take 650 mg by mouth every 12 (twelve) hours.   Marland Kitchen amLODipine (NORVASC) 10 MG tablet Take 10 mg by mouth daily. HOLD FOR SBP LESS THAN 110,  . aspirin 81 MG chewable tablet Chew 81 mg by mouth daily.  . cefTRIAXone 1 g in dextrose 5 % 50 mL Inject 1 g into the vein daily. x 10 days ending on 08/26/18  . Cholecalciferol (VITAMIN D3) 2000 units capsule Take 2,000 Units by mouth daily.   . cloNIDine (CATAPRES - DOSED IN MG/24 HR) 0.1 mg/24hr patch Place 0.1 mg onto the skin once a week. On Wednesday  . clopidogrel (PLAVIX) 75 MG tablet Take 1 tablet (75 mg total) by mouth daily.  Marland Kitchen dextrose (GLUTOSE) 40 % GEL Take 1 Tube by mouth as needed for low blood sugar. Give 1 tube every 30 minutes Until BS > 70 and pt is still responsive.  . fluticasone (FLONASE) 50 MCG/ACT nasal spray Place 1 spray into both nostrils daily.   Marland Kitchen glucagon (GLUCAGON EMERGENCY) 1 MG injection Inject 1 mg into the muscle once as needed. For hypoglycemia (symptomatic) not responding to oral glucose or snack. May repeat x 1 after 15 mins. Use only if pt is becoming unresponsive.  Marland Kitchen HYDROcodone-acetaminophen (NORCO) 5-325 MG tablet Take 1-2 tablets by mouth every 6 (six) hours as needed for moderate pain or severe pain.  . Infant Care Products (DERMACLOUD) CREA Apply 1 application topically daily as needed (for skin irritation). Apply liberal amount topically to area of skin  irritation as needed. Ok to leave at bedside   . insulin lispro (HUMALOG KWIKPEN) 100 UNIT/ML KiwkPen Inject 3 Units into the skin 3 (three) times daily as needed (For CBG >200).  . insulin lispro protamine-lispro (HUMALOG 75/25  MIX) (75-25) 100 UNIT/ML SUSP injection Inject 21 Units into the skin 3 (three) times daily with meals.   . isosorbide mononitrate (IMDUR) 30 MG 24 hr tablet Take 30 mg by mouth daily.   Marland Kitchen latanoprost (XALATAN) 0.005 % ophthalmic solution Place 1 drop into both eyes at bedtime.   Marland Kitchen levothyroxine (SYNTHROID, LEVOTHROID) 50 MCG tablet Take 50 mcg by mouth daily before breakfast.   . Menthol-Methyl Salicylate (MUSCLE RUB) 10-15 % CREA Apply 1 application topically 2 (two) times daily as needed for muscle pain. Apply to area of pain  . metFORMIN (GLUCOPHAGE) 500 MG tablet Take 500 mg by mouth 2 (two) times daily with a meal.   . metoprolol succinate (TOPROL-XL) 100 MG 24 hr tablet Take 100 mg by mouth 2 (two) times daily. Take with or immediately following a meal.   . nitroGLYCERIN (NITROSTAT) 0.4 MG SL tablet Place 0.4 mg under the tongue every 5 (five) minutes as needed for chest pain.   . NON FORMULARY Diet type: No Concentrated Sweets - Add moisture / gravy as a side on all meals  . olmesartan-hydrochlorothiazide (BENICAR HCT) 40-25 MG tablet Take 1 tablet by mouth daily. DX: Accelerated HTN; HOLD FOR SBP <110  . OXYGEN Inhale 2 L into the lungs as needed. For dyspnea ,   Check O2 saturations prior to placing on patient and at least every 4 hours after applying. Notify MD if oxygen saturations drop below baseline while receiving oxygen or no improvement in dyspnea  . Potassium Chloride CR (MICRO-K) 8 MEQ CPCR capsule CR Take 8 mEq by mouth 2 (two) times daily.  . rosuvastatin (CRESTOR) 5 MG tablet Take 5 mg by mouth every Sunday. In the evening  . senna-docusate (SENOKOT-S) 8.6-50 MG per tablet Take 1 tablet by mouth 2 (two) times daily as needed for constipation.  .  [DISCONTINUED] cloNIDine (CATAPRES) 0.2 MG tablet Take 0.4 mg by mouth 2 (two) times daily. HOLD FOR SBP LESS THAN 110 (Please let provider know when med order is due to be re-newed from pharmacy- new dose to be called in.)   No facility-administered encounter medications on file as of 08/18/2018.      SIGNIFICANT DIAGNOSTIC EXAMS   PREVIOUS:  06-14-18: peripheral vascular catheterization:  The abdominal aorta is opacified with a bolus injection contrast. Renal arteries are single and patent. The aorta itself has diffuse disease but no hemodynamically significant lesions. The common and external iliac arteries are widely patent bilaterally.  The left common femoral is widely patent as is the profunda femoris.  The SFA is patent with diffuse disease but no heme dynamically significant lesions however the popliteal and the below-knee area does indeed have tandem significant stenosis greater than 80%.  The distal popliteal demonstrates increasing disease and the trifurcation is heavily diseased with occlusion of the anterior tibial and peroneal.  The posterior tibial and tibioperoneal trunk demonstrates a multiple segments of occlusion throughout its course.  Following angioplasty the posterior tibial now has in-line flow and less than 20% residual stenosis. Angioplasty of the popliteal yields an excellent result with less than 10% residual stenosis.  NO NEW EXAMS.    LABS REVIEWED: PREVIOUS:   11-16-17: hgb a1c 8.3 chol 234; ldl 144; trig  223; hdl 45 tsh 1.949 03-25-18: wbc 8.0; hgb 12.2; hct 36.6; mcv 94.7; plt 148; glucose 153; bun 37; creat 1.31; k+ 3.9; na++ 136; ca 9.1  06-14-18: wbc 7.4; hgb 11.8; hct 34.9; mcv 94.2; plt 187 06-15-18: wbc 7.5; hgb  12.2; hct 35.7; mcv 93.2; plt 186  06-17-18: hgb a1c 8.0 07-13-18: wbc 7.5; hgb 9.5; hct 28.9; mvc 91.4; plt 294   TODAY.   08-17-09: wbc 11.7; hgb 8.8; hct 29.9 mcv 90.6 plt 524; glucose 323; bun 41; creat 1.12; k+ 4.4; na++ 140; ca 9.3    Review  of Systems  Unable to perform ROS: Other (confusion )   Physical Exam  Constitutional: She appears well-developed and well-nourished. No distress.  Neck: No thyromegaly present.  Cardiovascular: Normal rate, regular rhythm and normal heart sounds.  Bilateral feet cold to touch Pedal pulses present + doppler   Pulmonary/Chest: Effort normal and breath sounds normal. No respiratory distress.  Abdominal: Soft. Bowel sounds are normal. She exhibits no distension. There is no tenderness.  Musculoskeletal: She exhibits edema.  Trace lower extremity edema Is able to move all extremities   Lymphadenopathy:    She has no cervical adenopathy.  Neurological: She is alert.  Skin: Skin is warm and dry. She is not diaphoretic.  Left lower extremity ulcerations: dressings intact no inflammation present does have gangrene   Psychiatric: She has a normal mood and affect.     ASSESSMENT/ PLAN:  TODAY:   1. Atherosclerosis of left lower extremity of native arteries of the extremities with gangrene:  2. Benign hypertensive heart disease without CHF:  Will increase her clonidine patch to 0.2 mg weekly  Will continue to monitor her status.   MD is aware of resident's narcotic use and is in agreement with current plan of care. We will attempt to wean resident as apropriate   Ok Edwards NP [facility]  Contact [phone] Monday through Friday 8am- 5pm  After hours call [phone]

## 2018-08-22 ENCOUNTER — Ambulatory Visit (INDEPENDENT_AMBULATORY_CARE_PROVIDER_SITE_OTHER): Payer: Medicare Other | Admitting: Vascular Surgery

## 2018-08-22 ENCOUNTER — Encounter (INDEPENDENT_AMBULATORY_CARE_PROVIDER_SITE_OTHER): Payer: Self-pay | Admitting: Vascular Surgery

## 2018-08-22 ENCOUNTER — Encounter (INDEPENDENT_AMBULATORY_CARE_PROVIDER_SITE_OTHER): Payer: Medicare Other

## 2018-08-22 ENCOUNTER — Encounter

## 2018-08-22 VITALS — BP 146/81 | HR 81 | Resp 16 | Ht 64.0 in | Wt 149.0 lb

## 2018-08-22 DIAGNOSIS — I10 Essential (primary) hypertension: Secondary | ICD-10-CM

## 2018-08-22 DIAGNOSIS — N183 Chronic kidney disease, stage 3 unspecified: Secondary | ICD-10-CM

## 2018-08-22 DIAGNOSIS — I70262 Atherosclerosis of native arteries of extremities with gangrene, left leg: Secondary | ICD-10-CM

## 2018-08-22 DIAGNOSIS — E785 Hyperlipidemia, unspecified: Secondary | ICD-10-CM | POA: Diagnosis not present

## 2018-08-22 DIAGNOSIS — E1122 Type 2 diabetes mellitus with diabetic chronic kidney disease: Secondary | ICD-10-CM | POA: Diagnosis not present

## 2018-08-22 DIAGNOSIS — Z794 Long term (current) use of insulin: Secondary | ICD-10-CM

## 2018-08-22 DIAGNOSIS — E034 Atrophy of thyroid (acquired): Secondary | ICD-10-CM

## 2018-08-22 NOTE — Progress Notes (Signed)
MRN : [id number]  Jody Dorsey is a 84 y.o. (July 03, 1930) female who presents with chief complaint of  Chief Complaint  Patient presents with  . Follow-up    [facility] 2week   .  History of Present Illness:   The patient returns to the office for followup s/p unsuccessful angiogram times two. There has been some deterioration in the lower extremity symptoms. The patient notes her ulcers and her rest pain symptoms. No new ulcers or wounds have occurred since the last visit.  There have been no significant changes to the patient's overall health care.  The patient denies amaurosis fugax or recent TIA symptoms. There are no recent neurological changes noted. The patient denies history of DVT, PE or superficial thrombophlebitis. The patient denies recent episodes of angina or shortness of breath.    Current Meds  Medication Sig  . acetaminophen (TYLENOL) 325 MG tablet Take 650 mg by mouth every 6 (six) hours as needed for moderate pain. do not exceed 3grams in 24hr period  . acetaminophen (TYLENOL) 650 MG CR tablet Take 650 mg by mouth every 12 (twelve) hours.   Marland Kitchen amLODipine (NORVASC) 10 MG tablet Take 10 mg by mouth daily. HOLD FOR SBP LESS THAN 110,  . aspirin 81 MG chewable tablet Chew 81 mg by mouth daily.  . cefTRIAXone 1 g in dextrose 5 % 50 mL Inject 1 g into the vein daily. x 10 days ending on 08/26/18  . Cholecalciferol (VITAMIN D3) 2000 units capsule Take 2,000 Units by mouth daily.   . cloNIDine (CATAPRES - DOSED IN MG/24 HR) 0.1 mg/24hr patch Place 0.1 mg onto the skin once a week. On Wednesday  . clopidogrel (PLAVIX) 75 MG tablet Take 1 tablet (75 mg total) by mouth daily.  Marland Kitchen dextrose (GLUTOSE) 40 % GEL Take 1 Tube by mouth as needed for low blood sugar. Give 1 tube every 30 minutes Until BS > 70 and pt is still responsive.  . fluticasone (FLONASE) 50 MCG/ACT nasal spray Place 1 spray into both nostrils daily.   Marland Kitchen glucagon (GLUCAGON EMERGENCY) 1 MG injection Inject 1 mg  into the muscle once as needed. For hypoglycemia (symptomatic) not responding to oral glucose or snack. May repeat x 1 after 15 mins. Use only if pt is becoming unresponsive.  Marland Kitchen HYDROcodone-acetaminophen (NORCO) 5-325 MG tablet Take 1-2 tablets by mouth every 6 (six) hours as needed for moderate pain or severe pain.  . Infant Care Products (DERMACLOUD) CREA Apply 1 application topically daily as needed (for skin irritation). Apply liberal amount topically to area of skin irritation as needed. Ok to leave at bedside   . insulin lispro (HUMALOG KWIKPEN) 100 UNIT/ML KiwkPen Inject 3 Units into the skin 3 (three) times daily as needed (For CBG >200).  . insulin lispro protamine-lispro (HUMALOG 75/25 MIX) (75-25) 100 UNIT/ML SUSP injection Inject 21 Units into the skin 3 (three) times daily with meals.   . isosorbide mononitrate (IMDUR) 30 MG 24 hr tablet Take 30 mg by mouth daily.   Marland Kitchen latanoprost (XALATAN) 0.005 % ophthalmic solution Place 1 drop into both eyes at bedtime.   Marland Kitchen levothyroxine (SYNTHROID, LEVOTHROID) 50 MCG tablet Take 50 mcg by mouth daily before breakfast.   . Menthol-Methyl Salicylate (MUSCLE RUB) 10-15 % CREA Apply 1 application topically 2 (two) times daily as needed for muscle pain. Apply to area of pain  . metFORMIN (GLUCOPHAGE) 500 MG tablet Take 500 mg by mouth 2 (two) times daily with a  meal.   . metoprolol succinate (TOPROL-XL) 100 MG 24 hr tablet Take 100 mg by mouth 2 (two) times daily. Take with or immediately following a meal.   . nitroGLYCERIN (NITROSTAT) 0.4 MG SL tablet Place 0.4 mg under the tongue every 5 (five) minutes as needed for chest pain.   . NON FORMULARY Diet type: No Concentrated Sweets - Add moisture / gravy as a side on all meals  . olmesartan-hydrochlorothiazide (BENICAR HCT) 40-25 MG tablet Take 1 tablet by mouth daily. DX: Accelerated HTN; HOLD FOR SBP <110  . OXYGEN Inhale 2 L into the lungs as needed. For dyspnea ,   Check O2 saturations prior to placing  on patient and at least every 4 hours after applying. Notify MD if oxygen saturations drop below baseline while receiving oxygen or no improvement in dyspnea  . Potassium Chloride CR (MICRO-K) 8 MEQ CPCR capsule CR Take 8 mEq by mouth 2 (two) times daily.  . rosuvastatin (CRESTOR) 5 MG tablet Take 5 mg by mouth every Sunday. In the evening  . senna-docusate (SENOKOT-S) 8.6-50 MG per tablet Take 1 tablet by mouth 2 (two) times daily as needed for constipation.    Past Medical History:  Diagnosis Date  . Bronchitis   . CAD (coronary artery disease)    status post 2 stents (Dr. Claiborne Billings, Dr. Elisabeth Cara, Dr. Fath-cardiologist) 2008; MI, 10/2008  . Chronic kidney disease   . Colonic adenoma    unspecified  . Dementia ([place])   . Diabetes mellitus type 2, uncomplicated ([place])   . DM (diabetes mellitus) ([place])   . Dysphagia   . Glaucoma   . HTN (hypertension)   . Hypothyroidism   . Microalbuminuria    diabetic  . Osteoarthritis   . Thyroid disease     Past Surgical History:  Procedure Laterality Date  . ABDOMINAL HYSTERECTOMY    . APPENDECTOMY  1949  . CATARACT EXTRACTION     left eye  . CERVICAL LAMINECTOMY  1994  . CESAREAN SECTION     x 3  . COLONOSCOPY W/ POLYPECTOMY  11/2007   (RTE) PCI/DES - Stents x 2 sequential- LAD 12/8/200  . CORONARY ANGIOPLASTY WITH STENT PLACEMENT     x 2  . LOWER EXTREMITY ANGIOGRAPHY Left 06/14/2018   Procedure: LOWER EXTREMITY ANGIOGRAPHY;  Surgeon: Katha Cabal, MD;  Location: [facility] [facility];  Service: Cardiovascular;  Laterality: Left;  . LOWER EXTREMITY ANGIOGRAPHY Left 07/12/2018   Procedure: LOWER EXTREMITY ANGIOGRAPHY;  Surgeon: Katha Cabal, MD;  Location: [place] [facility];  Service: Cardiovascular;  Laterality: Left;  . LOWER EXTREMITY ANGIOGRAPHY Left 08/03/2018   Procedure: LOWER EXTREMITY ANGIOGRAPHY;  Surgeon: Katha Cabal, MD;  Location: [place] [facility];  Service: Cardiovascular;  Laterality: Left;  . ROTATOR CUFF  REPAIR Left 1990    Social History Social History   Tobacco Use  . Smoking status: Never Smoker  . Smokeless tobacco: Never Used  Substance Use Topics  . Alcohol use: No  . Drug use: No    Family History Family History  Problem Relation Age of Onset  . Stroke Mother     Allergies  Allergen Reactions  . Ace Inhibitors Other (See Comments)    Noted by [facility]  . Simvastatin Other (See Comments)    Noted by [facility]   . Celebrex [Celecoxib] Hives, Itching and Rash     REVIEW OF SYSTEMS (Negative unless checked)  Constitutional: [] Weight loss  [] Fever  [] Chills Cardiac: [] Chest pain   []   Chest pressure   [] Palpitations   [] Shortness of breath when laying flat   [] Shortness of breath with exertion. Vascular:  [] Pain in legs with walking   [] Pain in legs at rest  [] History of DVT   [] Phlebitis   [] Swelling in legs   [] Varicose veins   [x] Non-healing ulcers Pulmonary:   [] Uses home oxygen   [] Productive cough   [] Hemoptysis   [] Wheeze  [] COPD   [] Asthma Neurologic:  [] Dizziness   [] Seizures   [x] History of stroke   [] History of TIA  [] Aphasia   [] Vissual changes   [] Weakness or numbness in arm   [] Weakness or numbness in leg Musculoskeletal:   [] Joint swelling   [] Joint pain   [] Low back pain Hematologic:  [] Easy bruising  [] Easy bleeding   [] Hypercoagulable state   [] Anemic Gastrointestinal:  [] Diarrhea   [] Vomiting  [] Gastroesophageal reflux/heartburn   [] Difficulty swallowing. Genitourinary:  [] Chronic kidney disease   [] Difficult urination  [] Frequent urination   [] Blood in urine Skin:  [x] Rashes   [x] Ulcers  Psychological:  [] History of anxiety   []  History of major depression.  Physical Examination  Vitals:   08/22/18 1351  BP: (!) 146/81  Pulse: 81  Resp: 16  Weight: 149 lb (67.6 kg)  Height: 5\' 4"  (1.626 m)   Body mass index is 25.58 kg/m. Gen: WD/WN, NAD Head: [place]/AT, No temporalis wasting.  Ear/Nose/Throat: Hearing grossly intact, nares w/o erythema or  drainage Eyes: PER, EOMI, sclera nonicteric.  Neck: Supple, no large masses.   Pulmonary:  Good air movement, no audible wheezing bilaterally, no use of accessory muscles.  Cardiac: RRR, no JVD Vascular:  The left forefoot is now mummified and dry no odor no drainage no cellulitis Vessel Right Left  Radial Palpable Palpable  PT Not Palpable Not Palpable  DP Not Palpable Not Palpable  Gastrointestinal: Non-distended. No guarding/no peritoneal signs.  Musculoskeletal: M/S 5/5 throughout.  No deformity or atrophy.  Neurologic: CN 2-12 intact. Symmetrical.  Speech is fluent. Motor exam as listed above. Psychiatric: Judgment intact, Mood & affect appropriate for pt's clinical situation. Dermatologic: No rashes or ulcers noted.  No changes consistent with cellulitis. Lymph : No lichenification or skin changes of chronic lymphedema.  CBC Lab Results  Component Value Date   WBC 11.7 (H) 08/17/2018   HGB 8.8 (L) 08/17/2018   HCT 29.9 (L) 08/17/2018   MCV 90.6 08/17/2018   PLT 524 (H) 08/17/2018    BMET    Component Value Date/Time   NA 139 08/17/2018 1400   NA 137 11/30/2012 1710   K 4.4 08/17/2018 1400   K 3.8 04/25/2013 1030   CL 100 08/17/2018 1400   CL 103 11/30/2012 1710   CO2 22 08/17/2018 1400   CO2 27 11/30/2012 1710   GLUCOSE 323 (H) 08/17/2018 1400   GLUCOSE 196 (H) 11/30/2012 1710   BUN 41 (H) 08/17/2018 1400   BUN 25 (H) 11/30/2012 1710   CREATININE 1.12 (H) 08/17/2018 1400   CREATININE 0.97 11/30/2012 1710   CALCIUM 9.3 08/17/2018 1400   CALCIUM 9.0 11/30/2012 1710   GFRNONAA 44 (L) 08/17/2018 1400   GFRNONAA 55 (L) 11/30/2012 1710   GFRAA 50 (L) 08/17/2018 1400   GFRAA >60 11/30/2012 1710   Estimated Creatinine Clearance: 34.7 mL/min (A) (by C-G formula based on SCr of 1.12 mg/dL (H)).  COAG Lab Results  Component Value Date   INR 0.94 06/14/2018   INR 0.96 11/30/2012   INR 1.0 11/30/2012    Radiology No results  found.   Assessment/Plan 1.  Atherosclerosis of native artery of left lower extremity with gangrene [facility]) Recommend:  The patient is status post unsuccessful angiogram with intervention.  The forefoot is now mummified.  My plan is to watch this for now as it is not painful and it is not infected.  Patient will require amputation if her foot become painful or infected.   2. Essential (primary) hypertension Continue antihypertensive medications as already ordered, these medications have been reviewed and there are no changes at this time.   3. Type 2 diabetes mellitus with stage 3 chronic kidney disease, with long-term current use of insulin (HCC) Continue hypoglycemic medications as already ordered, these medications have been reviewed and there are no changes at this time.  Hgb A1C to be monitored as already arranged by primary service   4. Hyperlipidemia, unspecified hyperlipidemia type Continue statin as ordered and reviewed, no changes at this time   5. Hypothyroidism due to acquired atrophy of thyroid Continue Synthroid as ordered and reviewed, no changes at this time     Hortencia Pilar, MD  08/22/2018 1:58 PM

## 2018-08-28 ENCOUNTER — Encounter: Payer: Self-pay | Admitting: Adult Health

## 2018-09-05 ENCOUNTER — Other Ambulatory Visit: Payer: Self-pay | Admitting: Adult Health

## 2018-09-06 ENCOUNTER — Encounter: Payer: Self-pay | Admitting: Adult Health

## 2018-09-06 ENCOUNTER — Non-Acute Institutional Stay (SKILLED_NURSING_FACILITY): Payer: Medicare Other | Admitting: Adult Health

## 2018-09-06 ENCOUNTER — Telehealth (INDEPENDENT_AMBULATORY_CARE_PROVIDER_SITE_OTHER): Payer: Self-pay

## 2018-09-06 DIAGNOSIS — I70262 Atherosclerosis of native arteries of extremities with gangrene, left leg: Secondary | ICD-10-CM | POA: Diagnosis not present

## 2018-09-06 NOTE — Telephone Encounter (Signed)
I agree she is so debilitated in the nursing home if we can avoid an amputation that would be a good thing.

## 2018-09-06 NOTE — Telephone Encounter (Signed)
Has been to unsuccessful previous angiograms on her lower extremity.  Per Dr. Nino Parsley last note and visit with her the foot is necrotic and "mummified".  If she is having new pain or increased pain of that limb or if it has become smelly, draining pus, or wet appearing (as well as any systemic signs/symptoms of infection), then she would need to be seen in the office sooner than 11/21.

## 2018-09-06 NOTE — Telephone Encounter (Signed)
FYI---Nurse called and stated that after her assessment of the patient's foot today, it is "necrotic and cold as ice". She wanted to call and make sure that this is something that Dr. Delana Meyer was aware of since her last visit here in the office was just this month.  After reviewing Dr. Nino Parsley last note, he did notice and notate all of these changes at the patient's last visit, and he also stated that the angiogram was unsuccessful. He also noted that the foot would need to be amputated if there was any infection. I read this porting of the note to the nurse and she acknowledged the same findings. She is going to try to steer the family towards Hospice she says.

## 2018-09-06 NOTE — Telephone Encounter (Signed)
The nurse states that the patient isn't complaining of any pain, she just wanted [place] to be aware of the cold foot and the necrosis.

## 2018-09-06 NOTE — Progress Notes (Signed)
Location:   [facility] Room Number: [place] of Service:  SNF (31)   CODE STATUS: DNR  Allergies  Allergen Reactions  . Ace Inhibitors Other (See Comments)    Noted by [facility]  . Simvastatin Other (See Comments)    Noted by [facility]   . Celebrex [Celecoxib] Hives, Itching and Rash    Chief Complaint  Patient presents with  . Acute Visit    Left foot wound    HPI:  Her left foot is declining. There is necrosis up to half her of foot. There are no indications of uncontrolled pain. She is being followed by vein and vascular service. Her daughter Santiago Glad) does want to continue with aggressive care including amputation. Her daughter does not want hospice care at this time. Leonor's appetite is poor; there are no reports of fevers present.   Past Medical History:  Diagnosis Date  . Bronchitis   . CAD (coronary artery disease)    status post 2 stents (Dr. Claiborne Billings, Dr. Elisabeth Cara, Dr. Fath-cardiologist) 2008; MI, 10/2008  . Chronic kidney disease   . Colonic adenoma    unspecified  . Dementia ([place])   . Diabetes mellitus type 2, uncomplicated ([place])   . DM (diabetes mellitus) ([place])   . Dysphagia   . Glaucoma   . HTN (hypertension)   . Hypothyroidism   . Microalbuminuria    diabetic  . Osteoarthritis   . Thyroid disease     Past Surgical History:  Procedure Laterality Date  . ABDOMINAL HYSTERECTOMY    . APPENDECTOMY  1949  . CATARACT EXTRACTION     left eye  . CERVICAL LAMINECTOMY  1994  . CESAREAN SECTION     x 3  . COLONOSCOPY W/ POLYPECTOMY  11/2007   (RTE) PCI/DES - Stents x 2 sequential- LAD 12/8/200  . CORONARY ANGIOPLASTY WITH STENT PLACEMENT     x 2  . LOWER EXTREMITY ANGIOGRAPHY Left 06/14/2018   Procedure: LOWER EXTREMITY ANGIOGRAPHY;  Surgeon: Katha Cabal, MD;  Location: [facility];  Service: Cardiovascular;  Laterality: Left;  . LOWER EXTREMITY ANGIOGRAPHY Left 07/12/2018   Procedure: LOWER EXTREMITY ANGIOGRAPHY;   Surgeon: Katha Cabal, MD;  Location: [facility];  Service: Cardiovascular;  Laterality: Left;  . LOWER EXTREMITY ANGIOGRAPHY Left 08/03/2018   Procedure: LOWER EXTREMITY ANGIOGRAPHY;  Surgeon: Katha Cabal, MD;  Location: [facility];  Service: Cardiovascular;  Laterality: Left;  . ROTATOR CUFF REPAIR Left 1990    Social History   Socioeconomic History  . Marital status: Widowed    Spouse name: Not on file  . Number of children: 3  . Years of education: 71  . Highest education level: 11th grade  Occupational History  . Not on file  Social Needs  . Financial resource strain: Not on file  . Food insecurity:    Worry: Not on file    Inability: Not on file  . Transportation needs:    Medical: Not on file    Non-medical: Not on file  Tobacco Use  . Smoking status: Never Smoker  . Smokeless tobacco: Never Used  Substance and Sexual Activity  . Alcohol use: No  . Drug use: No  . Sexual activity: Never  Lifestyle  . Physical activity:    Days per week: Not on file    Minutes per session: Not on file  . Stress: Not on file  Relationships  . Social connections:  Talks on phone: Not on file    Gets together: Not on file    Attends religious service: Not on file    Active member of club or organization: Not on file    Attends meetings of clubs or organizations: Not on file    Relationship status: Not on file  . Intimate partner violence:    Fear of current or ex partner: Not on file    Emotionally abused: Not on file    Physically abused: Not on file    Forced sexual activity: Not on file  Other Topics Concern  . Not on file  Social History Narrative   Lives alone. Widowed since 11/12/2011. Denies tobacco    PCP Dr. Kern Alberta [place]   Lives in [place]    Denies cigarette use    Denies alcohol use   DNR   Admitted to [facility] 12/14/2016               Family History  Problem Relation Age of Onset  . Stroke  Mother       VITAL SIGNS BP (!) 144/62   Pulse (!) 58   Temp 98.4 F (36.9 C)   Resp 16   Ht 5\' 4"  (1.626 m)   Wt 149 lb (67.6 kg)   SpO2 100%   BMI 25.58 kg/m   Outpatient Encounter Medications as of 09/06/2018  Medication Sig  . acetaminophen (TYLENOL) 325 MG tablet Take 650 mg by mouth every 6 (six) hours as needed for moderate pain. do not exceed 3grams in 24hr period  . acetaminophen (TYLENOL) 650 MG CR tablet Take 650 mg by mouth every 12 (twelve) hours.   Marland Kitchen amLODipine (NORVASC) 10 MG tablet Take 10 mg by mouth daily. HOLD FOR SBP LESS THAN 110,  . aspirin 81 MG chewable tablet Chew 81 mg by mouth daily.  . Cholecalciferol (VITAMIN D3) 2000 units capsule Take 2,000 Units by mouth daily.   . cloNIDine (CATAPRES - DOSED IN MG/24 HR) 0.1 mg/24hr patch Place 0.1 mg onto the skin once a week. On Wednesday  . clopidogrel (PLAVIX) 75 MG tablet Take 1 tablet (75 mg total) by mouth daily.  Marland Kitchen dextrose (GLUTOSE) 40 % GEL Take 1 Tube by mouth as needed for low blood sugar. Give 1 tube every 30 minutes Until BS > 70 and pt is still responsive.  . fluticasone (FLONASE) 50 MCG/ACT nasal spray Place 1 spray into both nostrils daily.   Marland Kitchen glucagon (GLUCAGON EMERGENCY) 1 MG injection Inject 1 mg into the muscle once as needed. For hypoglycemia (symptomatic) not responding to oral glucose or snack. May repeat x 1 after 15 mins. Use only if pt is becoming unresponsive.  Marland Kitchen HYDROcodone-acetaminophen (NORCO) 5-325 MG tablet Take 1-2 tablets by mouth every 6 (six) hours as needed for moderate pain or severe pain.  . Infant Care Products (DERMACLOUD) CREA Apply 1 application topically daily as needed (for skin irritation). Apply liberal amount topically to area of skin irritation as needed. Ok to leave at bedside   . insulin lispro (HUMALOG KWIKPEN) 100 UNIT/ML KiwkPen Inject 3 Units into the skin 3 (three) times daily as needed (For CBG >200).  . insulin lispro protamine-lispro (HUMALOG 75/25 MIX)  (75-25) 100 UNIT/ML SUSP injection Inject 21 Units into the skin 3 (three) times daily with meals.   . isosorbide mononitrate (IMDUR) 30 MG 24 hr tablet Take 30 mg by mouth daily.   Marland Kitchen latanoprost (XALATAN) 0.005 % ophthalmic solution Place  1 drop into both eyes at bedtime.   Marland Kitchen levothyroxine (SYNTHROID, LEVOTHROID) 50 MCG tablet Take 50 mcg by mouth daily before breakfast.   . Menthol-Methyl Salicylate (MUSCLE RUB) 10-15 % CREA Apply 1 application topically 2 (two) times daily as needed for muscle pain. Apply to area of pain  . metFORMIN (GLUCOPHAGE) 500 MG tablet Take 500 mg by mouth 2 (two) times daily with a meal.   . metoprolol succinate (TOPROL-XL) 100 MG 24 hr tablet Take 100 mg by mouth 2 (two) times daily. Take with or immediately following a meal.   . nitroGLYCERIN (NITROSTAT) 0.4 MG SL tablet Place 0.4 mg under the tongue every 5 (five) minutes as needed for chest pain.   . NON FORMULARY Diet type: No Concentrated Sweets - Add moisture / gravy as a side on all meals  . olmesartan-hydrochlorothiazide (BENICAR HCT) 40-25 MG tablet Take 1 tablet by mouth daily. DX: Accelerated HTN; HOLD FOR SBP <110  . OXYGEN Inhale 2 L into the lungs as needed. For dyspnea ,   Check O2 saturations prior to placing on patient and at least every 4 hours after applying. Notify MD if oxygen saturations drop below baseline while receiving oxygen or no improvement in dyspnea  . Potassium Chloride CR (MICRO-K) 8 MEQ CPCR capsule CR Take 8 mEq by mouth 2 (two) times daily.  . Povidone-Iodine (BETADINE EX) Apply to necrotic areas of left toes  . rosuvastatin (CRESTOR) 5 MG tablet Take 5 mg by mouth every Sunday. In the evening  . senna-docusate (SENOKOT-S) 8.6-50 MG per tablet Take 1 tablet by mouth 2 (two) times daily as needed for constipation.   No facility-administered encounter medications on file as of 09/06/2018.      SIGNIFICANT DIAGNOSTIC EXAMS  PREVIOUS:  06-14-18: peripheral vascular  catheterization:  The abdominal aorta is opacified with a bolus injection contrast. Renal arteries are single and patent. The aorta itself has diffuse disease but no hemodynamically significant lesions. The common and external iliac arteries are widely patent bilaterally.  The left common femoral is widely patent as is the profunda femoris.  The SFA is patent with diffuse disease but no heme dynamically significant lesions however the popliteal and the below-knee area does indeed have tandem significant stenosis greater than 80%.  The distal popliteal demonstrates increasing disease and the trifurcation is heavily diseased with occlusion of the anterior tibial and peroneal.  The posterior tibial and tibioperoneal trunk demonstrates a multiple segments of occlusion throughout its course.  Following angioplasty the posterior tibial now has in-line flow and less than 20% residual stenosis. Angioplasty of the popliteal yields an excellent result with less than 10% residual stenosis.  NO NEW EXAMS.    LABS REVIEWED: PREVIOUS:   11-16-17: hgb a1c 8.3 chol 234; ldl 144; trig  223; hdl 45 tsh 1.949 03-25-18: wbc 8.0; hgb 12.2; hct 36.6; mcv 94.7; plt 148; glucose 153; bun 37; creat 1.31; k+ 3.9; na++ 136; ca 9.1  06-14-18: wbc 7.4; hgb 11.8; hct 34.9; mcv 94.2; plt 187 06-15-18: wbc 7.5; hgb 12.2; hct 35.7; mcv 93.2; plt 186  06-17-18: hgb a1c 8.0 07-13-18: wbc 7.5; hgb 9.5; hct 28.9; mvc 91.4; plt 294  08-17-09: wbc 11.7; hgb 8.8; hct 29.9 mcv 90.6 plt 524; glucose 323; bun 41; creat 1.12; k+ 4.4; na++ 140; ca 9.3   NO NEW LABS.    Review of Systems  Unable to perform ROS: Dementia (confusion )   Physical Exam  Constitutional: No distress.  Frail  Neck: No thyromegaly present.  Cardiovascular: Normal rate, regular rhythm and normal heart sounds.  Bilateral feet cold to touch Pedal pulses present + doppler    Pulmonary/Chest: Effort normal and breath sounds normal. No respiratory distress.  Abdominal:  Soft. Bowel sounds are normal. She exhibits no distension. There is no tenderness.  Musculoskeletal: She exhibits edema.  Trace lower extremity edema Is able to move all extremities    Lymphadenopathy:    She has no cervical adenopathy.  Neurological: She is alert.  Skin: Skin is warm and dry. She is not diaphoretic.  Left foot necrosis up to half of foot.   Psychiatric: She has a normal mood and affect.     ASSESSMENT/ PLAN:  TODAY:   1. Atherosclerosis of left lower extremity of native arteries of the extremities with gangrene:  She is status post left lower extremity arteriogram:  Will continue her current plan of care. At this time will not setup a hospice consult will monitor her status.     MD is aware of resident's narcotic use and is in agreement with current plan of care. We will attempt to wean resident as apropriate   Ok Edwards NP [facility]  Contact [phone] Monday through Friday 8am- 5pm  After hours call [phone]

## 2018-09-09 ENCOUNTER — Encounter
Admission: RE | Admit: 2018-09-09 | Discharge: 2018-09-09 | Disposition: A | Discharge status: Home / Self Care | Payer: Medicare Other | Source: Ambulatory Visit | Attending: Internal Medicine | Admitting: Internal Medicine

## 2018-09-13 ENCOUNTER — Non-Acute Institutional Stay (SKILLED_NURSING_FACILITY): Payer: Medicare Other | Admitting: Adult Health

## 2018-09-13 ENCOUNTER — Encounter: Payer: Self-pay | Admitting: Adult Health

## 2018-09-13 DIAGNOSIS — R627 Adult failure to thrive: Secondary | ICD-10-CM | POA: Diagnosis not present

## 2018-09-13 DIAGNOSIS — N183 Chronic kidney disease, stage 3 unspecified: Secondary | ICD-10-CM

## 2018-09-13 DIAGNOSIS — I7025 Atherosclerosis of native arteries of other extremities with ulceration: Secondary | ICD-10-CM | POA: Diagnosis not present

## 2018-09-13 DIAGNOSIS — F0150 Vascular dementia without behavioral disturbance: Secondary | ICD-10-CM

## 2018-09-13 DIAGNOSIS — E1122 Type 2 diabetes mellitus with diabetic chronic kidney disease: Secondary | ICD-10-CM | POA: Diagnosis not present

## 2018-09-13 DIAGNOSIS — Z794 Long term (current) use of insulin: Secondary | ICD-10-CM

## 2018-09-13 NOTE — Progress Notes (Signed)
Location:   [facility] Room Number: [place] of Service:  SNF (31)   CODE STATUS: DNR  Allergies  Allergen Reactions  . Ace Inhibitors Other (See Comments)    Noted by [facility]  . Simvastatin Other (See Comments)    Noted by [facility]   . Celebrex [Celecoxib] Hives, Itching and Rash    Chief Complaint  Patient presents with  . Acute Visit    Weight Loss    HPI:  She is slowly losing weight from 155 pounds in October and now 149 pounds. She is holding food in her mouth; has a poor appetite; no indications of pain present. Her cbg readings 300-400. Her daughter would like for ST to evaluate and treat as indicated.   Past Medical History:  Diagnosis Date  . Bronchitis   . CAD (coronary artery disease)    status post 2 stents (Dr. Claiborne Billings, Dr. Elisabeth Cara, Dr. Fath-cardiologist) 2008; MI, 10/2008  . Chronic kidney disease   . Colonic adenoma    unspecified  . Dementia ([place])   . Diabetes mellitus type 2, uncomplicated ([place])   . DM (diabetes mellitus) ([place])   . Dysphagia   . Glaucoma   . HTN (hypertension)   . Hypothyroidism   . Microalbuminuria    diabetic  . Osteoarthritis   . Thyroid disease     Past Surgical History:  Procedure Laterality Date  . ABDOMINAL HYSTERECTOMY    . APPENDECTOMY  1949  . CATARACT EXTRACTION     left eye  . CERVICAL LAMINECTOMY  1994  . CESAREAN SECTION     x 3  . COLONOSCOPY W/ POLYPECTOMY  11/2007   (RTE) PCI/DES - Stents x 2 sequential- LAD 12/8/200  . CORONARY ANGIOPLASTY WITH STENT PLACEMENT     x 2  . LOWER EXTREMITY ANGIOGRAPHY Left 06/14/2018   Procedure: LOWER EXTREMITY ANGIOGRAPHY;  Surgeon: Katha Cabal, MD;  Location: [place] [facility];  Service: Cardiovascular;  Laterality: Left;  . LOWER EXTREMITY ANGIOGRAPHY Left 07/12/2018   Procedure: LOWER EXTREMITY ANGIOGRAPHY;  Surgeon: Katha Cabal, MD;  Location: [place] [facility];  Service: Cardiovascular;  Laterality: Left;  . LOWER EXTREMITY  ANGIOGRAPHY Left 08/03/2018   Procedure: LOWER EXTREMITY ANGIOGRAPHY;  Surgeon: Katha Cabal, MD;  Location: [place] [facility];  Service: Cardiovascular;  Laterality: Left;  . ROTATOR CUFF REPAIR Left 1990    Social History   Socioeconomic History  . Marital status: Widowed    Spouse name: Not on file  . Number of children: 3  . Years of education: 49  . Highest education level: 11th grade  Occupational History  . Not on file  Social Needs  . Financial resource strain: Not on file  . Food insecurity:    Worry: Not on file    Inability: Not on file  . Transportation needs:    Medical: Not on file    Non-medical: Not on file  Tobacco Use  . Smoking status: Never Smoker  . Smokeless tobacco: Never Used  Substance and Sexual Activity  . Alcohol use: No  . Drug use: No  . Sexual activity: Never  Lifestyle  . Physical activity:    Days per week: Not on file    Minutes per session: Not on file  . Stress: Not on file  Relationships  . Social connections:    Talks on phone: Not on file    Gets together: Not on file  Attends religious service: Not on file    Active member of club or organization: Not on file    Attends meetings of clubs or organizations: Not on file    Relationship status: Not on file  . Intimate partner violence:    Fear of current or ex partner: Not on file    Emotionally abused: Not on file    Physically abused: Not on file    Forced sexual activity: Not on file  Other Topics Concern  . Not on file  Social History Narrative   Lives alone. Widowed since 11/12/2011. Denies tobacco    PCP Dr. Kern Alberta Monona   Lives in [place]    Denies cigarette use    Denies alcohol use   DNR   Admitted to [facility] 12/14/2016               Family History  Problem Relation Age of Onset  . Stroke Mother       VITAL SIGNS BP 128/68   Pulse 86   Temp 98.5 F (36.9 C)   Resp 16   Ht 5\' 4"  (1.626 m)   Wt 149 lb (67.6  kg)   SpO2 99%   BMI 25.58 kg/m   Outpatient Encounter Medications as of 09/13/2018  Medication Sig  . acetaminophen (TYLENOL) 325 MG tablet Take 650 mg by mouth every 6 (six) hours as needed for moderate pain. do not exceed 3grams in 24hr period  . acetaminophen (TYLENOL) 650 MG CR tablet Take 650 mg by mouth every 12 (twelve) hours.   Marland Kitchen amLODipine (NORVASC) 10 MG tablet Take 10 mg by mouth daily. HOLD FOR SBP LESS THAN 110,  . aspirin 81 MG chewable tablet Chew 81 mg by mouth daily.  . Cholecalciferol (VITAMIN D3) 2000 units capsule Take 2,000 Units by mouth daily.   . cloNIDine (CATAPRES - DOSED IN MG/24 HR) 0.2 mg/24hr patch Place 0.2 mg onto the skin once a week. On Friday  . clopidogrel (PLAVIX) 75 MG tablet Take 1 tablet (75 mg total) by mouth daily.  Marland Kitchen dextrose (GLUTOSE) 40 % GEL Take 1 Tube by mouth as needed for low blood sugar. Give 1 tube every 30 minutes Until BS > 70 and pt is still responsive.  . fluticasone (FLONASE) 50 MCG/ACT nasal spray Place 1 spray into both nostrils daily.   Marland Kitchen glucagon (GLUCAGON EMERGENCY) 1 MG injection Inject 1 mg into the muscle once as needed. For hypoglycemia (symptomatic) not responding to oral glucose or snack. May repeat x 1 after 15 mins. Use only if pt is becoming unresponsive.  Marland Kitchen GLUCERNA ([place]) LIQD Give one container 2 times daily with Breakfast and Lunch  . HYDROcodone-acetaminophen (NORCO) 5-325 MG tablet Take 1-2 tablets by mouth every 6 (six) hours as needed for moderate pain or severe pain.  . Infant Care Products (DERMACLOUD) CREA Apply 1 application topically daily as needed (for skin irritation). Apply liberal amount topically to area of skin irritation as needed. Ok to leave at bedside   . insulin lispro (HUMALOG KWIKPEN) 100 UNIT/ML KiwkPen Inject 3 Units into the skin 3 (three) times daily. For CBG >=200  . insulin lispro protamine-lispro (HUMALOG 75/25 MIX) (75-25) 100 UNIT/ML SUSP injection Inject 21 Units into the skin 3  (three) times daily with meals.   . isosorbide mononitrate (IMDUR) 30 MG 24 hr tablet Take 30 mg by mouth daily.   Marland Kitchen latanoprost (XALATAN) 0.005 % ophthalmic solution Place 1 drop into both eyes at  bedtime.   Marland Kitchen levothyroxine (SYNTHROID, LEVOTHROID) 50 MCG tablet Take 50 mcg by mouth daily before breakfast.   . Menthol-Methyl Salicylate (MUSCLE RUB) 10-15 % CREA Apply 1 application topically 2 (two) times daily as needed for muscle pain. Apply to area of pain  . metFORMIN (GLUCOPHAGE) 500 MG tablet Take 500 mg by mouth 2 (two) times daily with a meal.   . metoprolol succinate (TOPROL-XL) 100 MG 24 hr tablet Take 100 mg by mouth 2 (two) times daily. Take with or immediately following a meal.   . nitroGLYCERIN (NITROSTAT) 0.4 MG SL tablet Place 0.4 mg under the tongue every 5 (five) minutes as needed for chest pain.   . NON FORMULARY Diet type: No Concentrated Sweets -Puree all meals. No Straws, use sippy cup  . olmesartan-hydrochlorothiazide (BENICAR HCT) 40-25 MG tablet Take 1 tablet by mouth daily. DX: Accelerated HTN; HOLD FOR SBP <110  . OXYGEN Inhale 2 L into the lungs as needed. For dyspnea ,   Check O2 saturations prior to placing on patient and at least every 4 hours after applying. Notify MD if oxygen saturations drop below baseline while receiving oxygen or no improvement in dyspnea  . Potassium Chloride CR (MICRO-K) 8 MEQ CPCR capsule CR Take 8 mEq by mouth 2 (two) times daily.  . Povidone-Iodine (BETADINE EX) Apply to necrotic areas of left toes  . rosuvastatin (CRESTOR) 5 MG tablet Take 5 mg by mouth every Sunday. In the evening  . senna-docusate (SENOKOT-S) 8.6-50 MG per tablet Take 1 tablet by mouth 2 (two) times daily as needed for constipation.   No facility-administered encounter medications on file as of 09/13/2018.      SIGNIFICANT DIAGNOSTIC EXAMS  PREVIOUS:  06-14-18: peripheral vascular catheterization:  The abdominal aorta is opacified with a bolus injection contrast.  Renal arteries are single and patent. The aorta itself has diffuse disease but no hemodynamically significant lesions. The common and external iliac arteries are widely patent bilaterally.  The left common femoral is widely patent as is the profunda femoris.  The SFA is patent with diffuse disease but no heme dynamically significant lesions however the popliteal and the below-knee area does indeed have tandem significant stenosis greater than 80%.  The distal popliteal demonstrates increasing disease and the trifurcation is heavily diseased with occlusion of the anterior tibial and peroneal.  The posterior tibial and tibioperoneal trunk demonstrates a multiple segments of occlusion throughout its course.  Following angioplasty the posterior tibial now has in-line flow and less than 20% residual stenosis. Angioplasty of the popliteal yields an excellent result with less than 10% residual stenosis.  NO NEW EXAMS.    LABS REVIEWED: PREVIOUS:   11-16-17: hgb a1c 8.3 chol 234; ldl 144; trig  223; hdl 45 tsh 1.949 03-25-18: wbc 8.0; hgb 12.2; hct 36.6; mcv 94.7; plt 148; glucose 153; bun 37; creat 1.31; k+ 3.9; na++ 136; ca 9.1  06-14-18: wbc 7.4; hgb 11.8; hct 34.9; mcv 94.2; plt 187 06-15-18: wbc 7.5; hgb 12.2; hct 35.7; mcv 93.2; plt 186  06-17-18: hgb a1c 8.0 07-13-18: wbc 7.5; hgb 9.5; hct 28.9; mvc 91.4; plt 294  08-17-09: wbc 11.7; hgb 8.8; hct 29.9 mcv 90.6 plt 524; glucose 323; bun 41; creat 1.12; k+ 4.4; na++ 140; ca 9.3   NO NEW LABS.   Review of Systems  Unable to perform ROS: Dementia (nonverbal )    Physical Exam  Constitutional: No distress.  Frail   Neck: No thyromegaly present.  Cardiovascular: Normal rate,  regular rhythm and normal heart sounds.  Feet cold unable to palpate pedal pulses   Pulmonary/Chest: Effort normal and breath sounds normal. No respiratory distress.  Abdominal: Soft. Bowel sounds are normal. She exhibits no distension. There is no tenderness.  Musculoskeletal:  Normal range of motion. She exhibits no edema.  Lymphadenopathy:    She has no cervical adenopathy.  Neurological:  Is aware   Skin: Skin is warm and dry. She is not diaphoretic.  Dressings intact to left lower extremity       ASSESSMENT/ PLAN:  TODAY:   1. 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 Type 2 diabetes mellitus with stage 3 chronic kidney disease with long term current use of insulin  3. Vascular dementia without behavioral disturbance 4. Failure to thrive in adult.   Will stop humalog mix Will use lantus 15 units nightly  Will use humalog 8 units with meals for intake >50%  MD is aware of resident's narcotic use and is in agreement with current plan of care. We will attempt to wean resident as apropriate   Ok Edwards NP [facility]  Contact [phone] Monday through Friday 8am- 5pm  After hours call [phone]

## 2018-09-16 ENCOUNTER — Other Ambulatory Visit: Payer: Self-pay | Admitting: Adult Health

## 2018-09-16 MED ORDER — MORPHINE SULFATE (CONCENTRATE) 20 MG/ML PO SOLN: 5.0000 mg | ORAL | 0 refills | Status: AC | PRN

## 2018-09-17 DIAGNOSIS — F0150 Vascular dementia without behavioral disturbance: Secondary | ICD-10-CM | POA: Insufficient documentation

## 2018-09-17 DIAGNOSIS — R627 Adult failure to thrive: Secondary | ICD-10-CM | POA: Insufficient documentation

## 2018-09-22 ENCOUNTER — Ambulatory Visit (INDEPENDENT_AMBULATORY_CARE_PROVIDER_SITE_OTHER): Payer: Medicare Other | Admitting: Vascular Surgery

## 2018-09-29 ENCOUNTER — Ambulatory Visit (INDEPENDENT_AMBULATORY_CARE_PROVIDER_SITE_OTHER): Payer: Medicare Other | Admitting: Vascular Surgery

## 2018-10-09 DEATH — deceased

## 2018-10-11 IMAGING — CT CT HEAD W/O CM
3 series · 15 of 47 positions shown, 18 images · non-contrast
Comparison: Head CT 12/04/2012

CLINICAL DATA: Altered mental status.  Unwitnessed fall.

EXAM:
CT HEAD WITHOUT CONTRAST
TECHNIQUE: Contiguous axial images were obtained from the base of the skull
through the vertex without intravenous contrast.

[Series 2: head wo · axial · 0.42mm/px · z∈[-142,-12]mm · 9 of 32 slices shown, 12 images]
[im 3/32  brain]
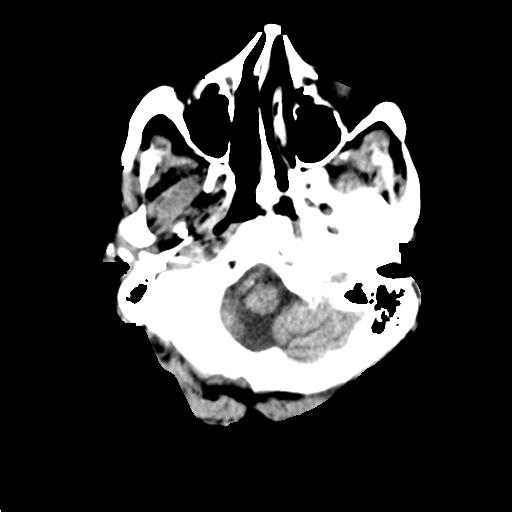
[im 3/32  bone]
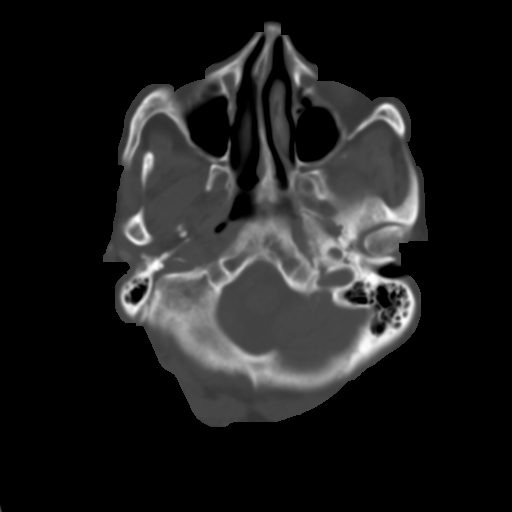
[im 6/32  brain]
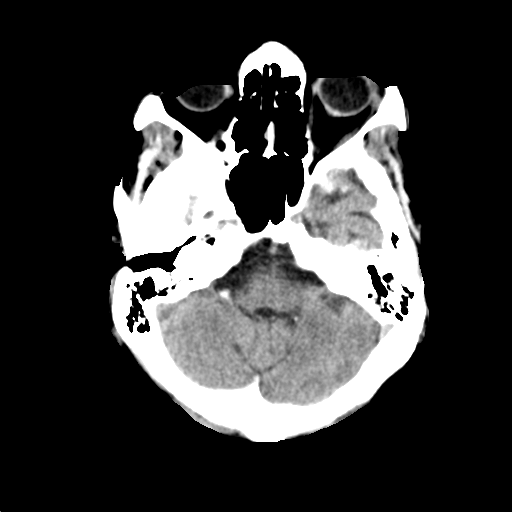
[im 9/32  brain]
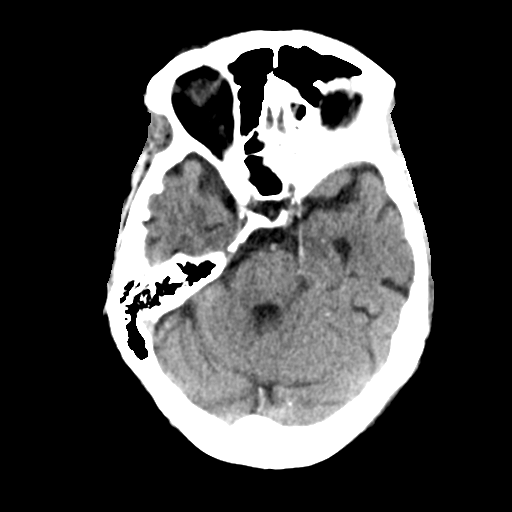
[im 12/32  brain]
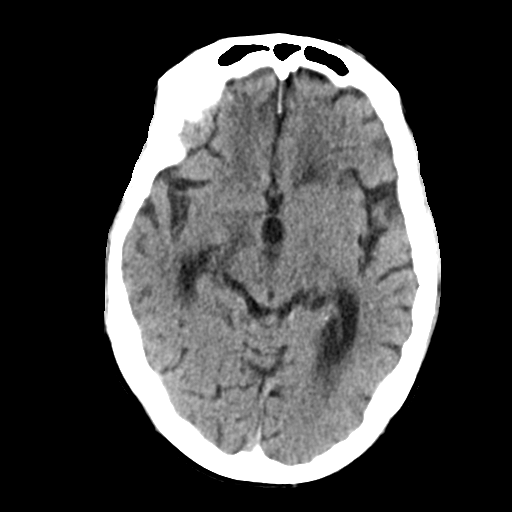
[im 17/32  brain]
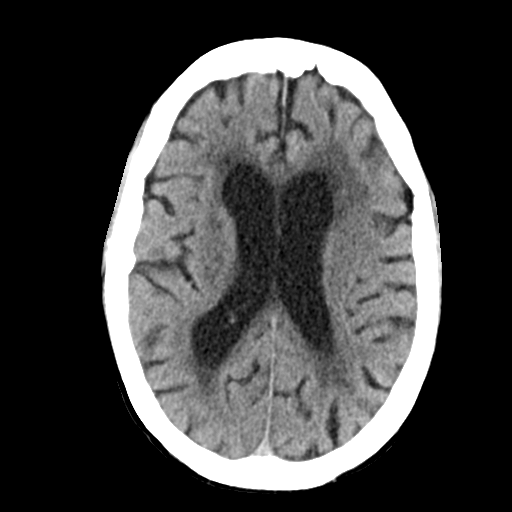
[im 17/32  bone]
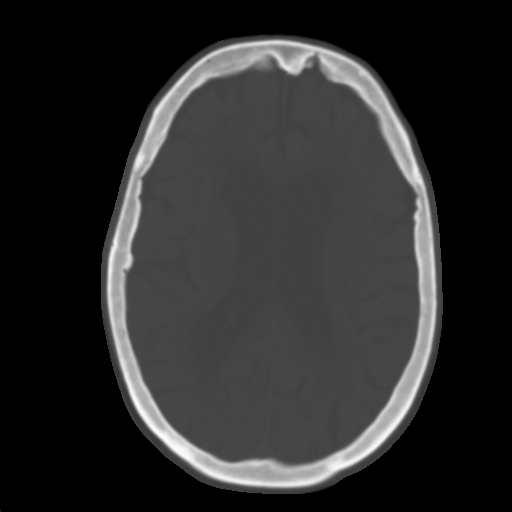
[im 20/32  brain]
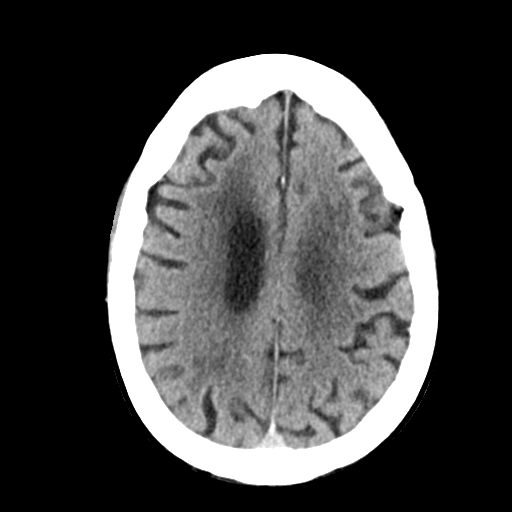
[im 23/32  brain]
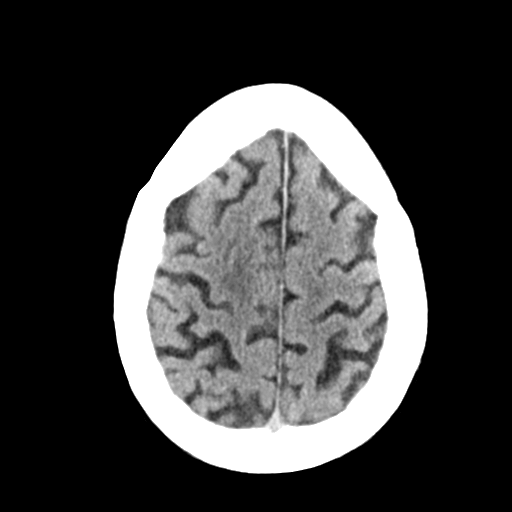
[im 26/32  brain]
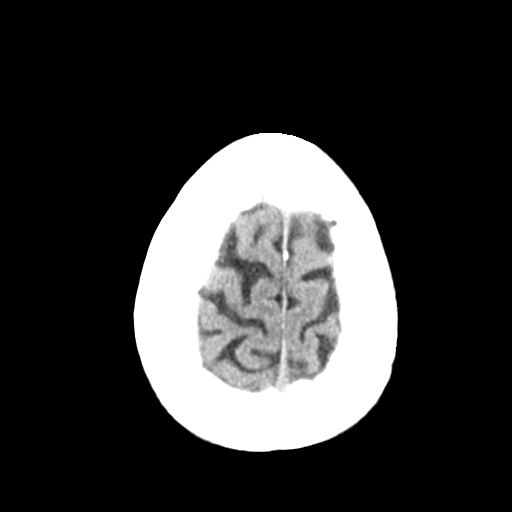
[im 29/32  brain]
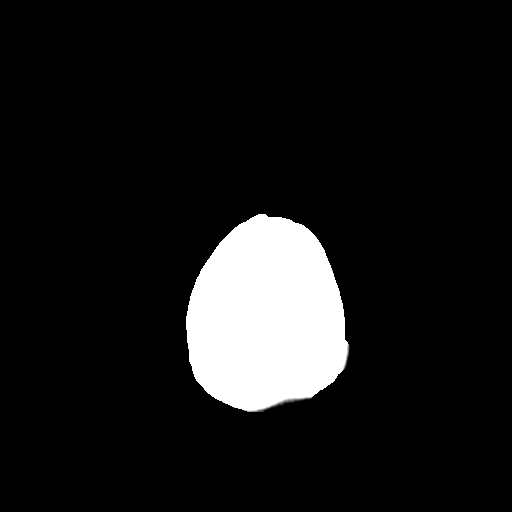
[im 29/32  bone]
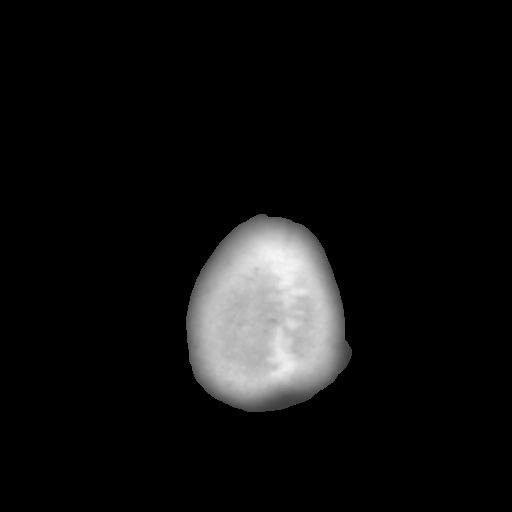

[Series 4: coronal soft tissue · coronal · 0.34mm/px · 3 of 69 slices shown]
[im 23/69  brain]
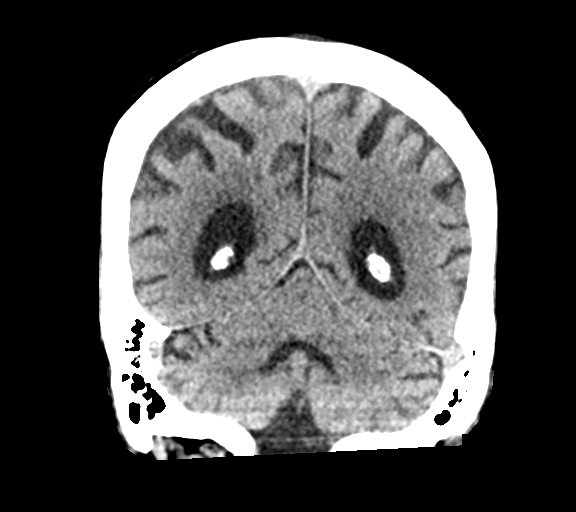
[im 31/69  brain]
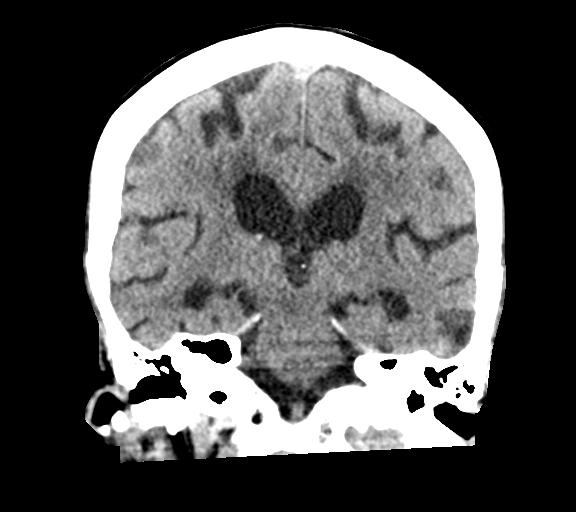
[im 38/69  brain]
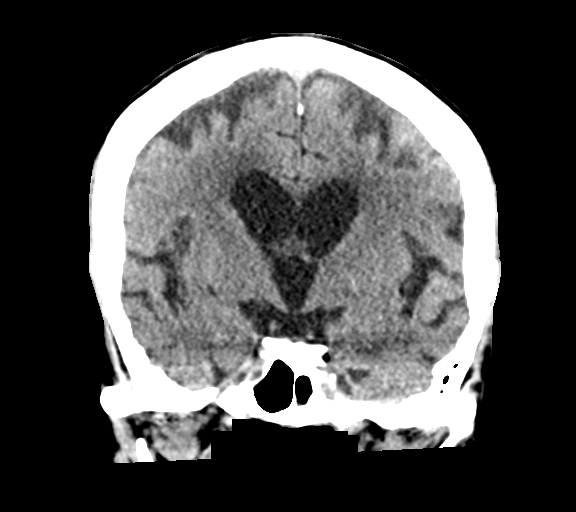

[Series 5: sagittal soft tissue · sagittal · 0.34mm/px · 3 of 55 slices shown]
[im 19/55  brain]
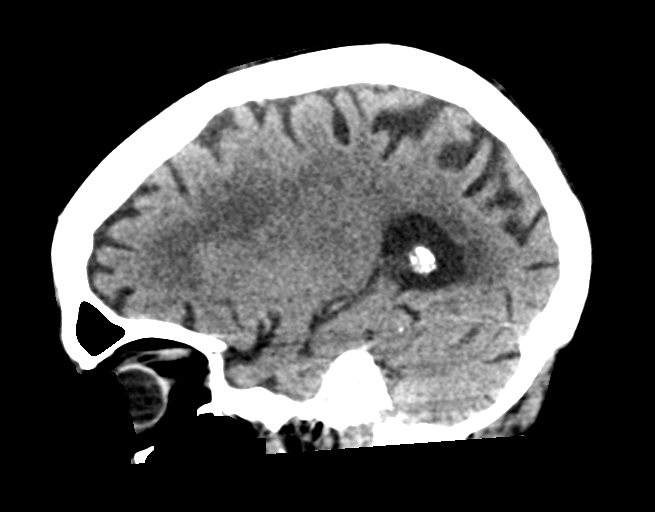
[im 28/55  brain]
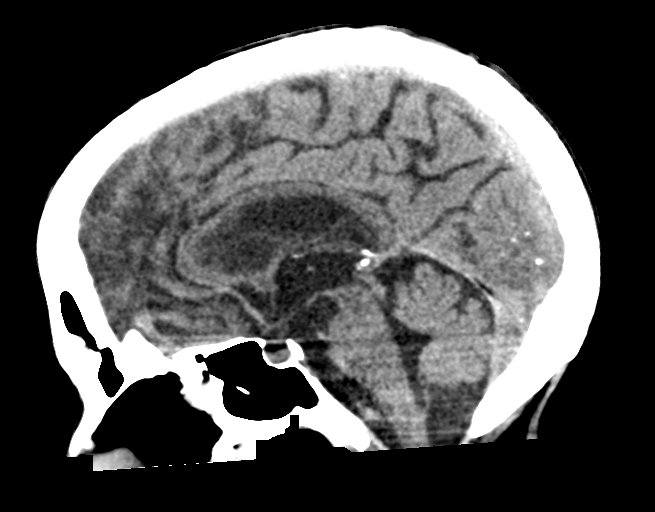
[im 37/55  brain]
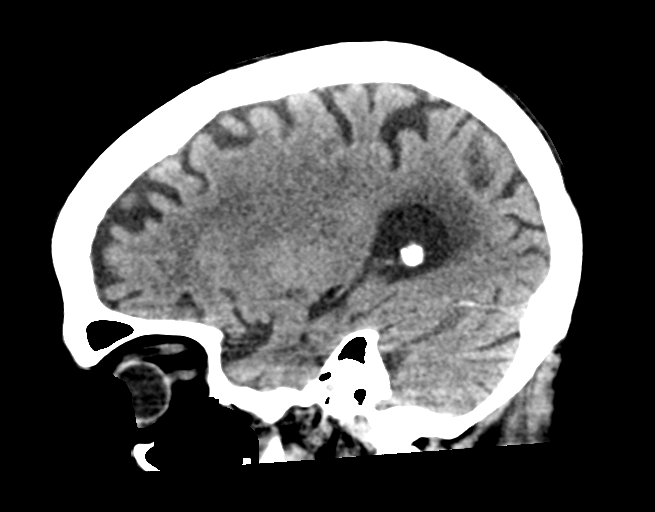

[15 of 47 positions shown; findings below may reference images not displayed]

FINDINGS: Brain: Slightly progressive age related cerebral atrophy,
ventriculomegaly and periventricular white matter disease. No
extra-axial fluid collections are identified. No CT findings for
acute hemispheric infarction or intracranial hemorrhage. No mass
lesions. The brainstem and cerebellum are normal.

Vascular: Stable vascular calcifications. No aneurysm or hyperdense
vessels.

Skull: No skull fracture or bone lesion.

Sinuses/Orbits: The paranasal sinuses and mastoid air cells are
clear. The globes are intact.

Other: No scalp lesion or hematoma.
IMPRESSION: Progressive age related cerebral atrophy, ventriculomegaly and
periventricular white matter disease since 5149.

No acute intracranial findings or skull fracture.
